# Patient Record
Sex: Female | Born: 1960
Health system: Southern US, Community
[De-identification: ages and names within clinical notes are randomized; demographics above are authoritative.]

## PROBLEM LIST (undated history)

## (undated) DIAGNOSIS — E785 Hyperlipidemia, unspecified: Secondary | ICD-10-CM

## (undated) DIAGNOSIS — E119 Type 2 diabetes mellitus without complications: Secondary | ICD-10-CM

## (undated) DIAGNOSIS — T7840XA Allergy, unspecified, initial encounter: Secondary | ICD-10-CM

## (undated) DIAGNOSIS — F419 Anxiety disorder, unspecified: Secondary | ICD-10-CM

## (undated) DIAGNOSIS — I10 Essential (primary) hypertension: Secondary | ICD-10-CM

## (undated) HISTORY — PX: TEMPOROMANDIBULAR JOINT SURGERY: SHX35

## (undated) HISTORY — DX: Type 2 diabetes mellitus without complications: E11.9

## (undated) HISTORY — PX: BACK SURGERY: SHX140

## (undated) HISTORY — PX: SPINE SURGERY: SHX786

## (undated) HISTORY — PX: APPENDECTOMY: SHX54

## (undated) HISTORY — DX: Anxiety disorder, unspecified: F41.9

## (undated) HISTORY — DX: Essential (primary) hypertension: I10

## (undated) HISTORY — PX: TONSILLECTOMY: SUR1361

## (undated) HISTORY — PX: COLONOSCOPY: SHX174

## (undated) HISTORY — DX: Hyperlipidemia, unspecified: E78.5

## (undated) HISTORY — PX: OTHER SURGICAL HISTORY: SHX169

## (undated) HISTORY — DX: Allergy, unspecified, initial encounter: T78.40XA

---

## 2000-07-12 ENCOUNTER — Other Ambulatory Visit: Admission: RE | Admit: 2000-07-12 | Discharge: 2000-07-12 | Payer: Self-pay | Admitting: Internal Medicine

## 2000-08-26 ENCOUNTER — Encounter: Admission: RE | Admit: 2000-08-26 | Discharge: 2000-08-26 | Payer: Self-pay | Admitting: Internal Medicine

## 2000-08-26 ENCOUNTER — Encounter: Payer: Self-pay | Admitting: Internal Medicine

## 2001-10-30 ENCOUNTER — Ambulatory Visit (HOSPITAL_COMMUNITY): Admission: RE | Admit: 2001-10-30 | Discharge: 2001-10-30 | Payer: Self-pay | Admitting: Gastroenterology

## 2003-03-26 ENCOUNTER — Other Ambulatory Visit: Admission: RE | Admit: 2003-03-26 | Discharge: 2003-03-26 | Payer: Self-pay | Admitting: Obstetrics and Gynecology

## 2004-08-18 ENCOUNTER — Other Ambulatory Visit: Admission: RE | Admit: 2004-08-18 | Discharge: 2004-08-18 | Payer: Self-pay | Admitting: Obstetrics and Gynecology

## 2007-04-19 ENCOUNTER — Encounter: Admission: RE | Admit: 2007-04-19 | Discharge: 2007-04-19 | Payer: Self-pay | Admitting: Internal Medicine

## 2007-04-21 ENCOUNTER — Encounter: Admission: RE | Admit: 2007-04-21 | Discharge: 2007-04-21 | Payer: Self-pay | Admitting: Internal Medicine

## 2007-11-07 ENCOUNTER — Ambulatory Visit (HOSPITAL_COMMUNITY): Admission: RE | Admit: 2007-11-07 | Discharge: 2007-11-08 | Payer: Self-pay | Admitting: Neurosurgery

## 2009-01-07 ENCOUNTER — Encounter: Payer: Self-pay | Admitting: Family Medicine

## 2009-10-27 ENCOUNTER — Ambulatory Visit: Payer: Self-pay | Admitting: Family Medicine

## 2009-10-27 DIAGNOSIS — I10 Essential (primary) hypertension: Secondary | ICD-10-CM | POA: Insufficient documentation

## 2009-10-27 DIAGNOSIS — R5383 Other fatigue: Secondary | ICD-10-CM | POA: Insufficient documentation

## 2009-10-27 DIAGNOSIS — R32 Unspecified urinary incontinence: Secondary | ICD-10-CM | POA: Insufficient documentation

## 2009-10-27 DIAGNOSIS — E785 Hyperlipidemia, unspecified: Secondary | ICD-10-CM | POA: Insufficient documentation

## 2009-11-01 LAB — CONVERTED CEMR LAB
ALT: 24 units/L (ref 0–35)
AST: 22 units/L (ref 0–37)
Albumin: 4.2 g/dL (ref 3.5–5.2)
Alkaline Phosphatase: 72 units/L (ref 39–117)
BUN: 22 mg/dL (ref 6–23)
Basophils Absolute: 0 10*3/uL (ref 0.0–0.1)
Basophils Relative: 0.1 % (ref 0.0–3.0)
Bilirubin, Direct: 0.1 mg/dL (ref 0.0–0.3)
CO2: 32 meq/L (ref 19–32)
Calcium: 9.6 mg/dL (ref 8.4–10.5)
Chloride: 97 meq/L (ref 96–112)
Cholesterol: 207 mg/dL — ABNORMAL HIGH (ref 0–200)
Creatinine, Ser: 0.7 mg/dL (ref 0.4–1.2)
Direct LDL: 149 mg/dL
Eosinophils Absolute: 0.1 10*3/uL (ref 0.0–0.7)
Eosinophils Relative: 0.9 % (ref 0.0–5.0)
GFR calc non Af Amer: 92.37 mL/min (ref >60)
Glucose, Bld: 94 mg/dL (ref 70–99)
HCT: 40.8 % (ref 36.0–46.0)
HDL: 46.4 mg/dL (ref >39.00)
Hemoglobin: 14 g/dL (ref 12.0–15.0)
Lymphocytes Relative: 29.9 % (ref 12.0–46.0)
Lymphs Abs: 3.5 10*3/uL (ref 0.7–4.0)
MCHC: 34.3 g/dL (ref 30.0–36.0)
MCV: 86.7 fL (ref 78.0–100.0)
Monocytes Absolute: 0.4 10*3/uL (ref 0.1–1.0)
Monocytes Relative: 3.1 % (ref 3.0–12.0)
Neutro Abs: 7.6 10*3/uL (ref 1.4–7.7)
Neutrophils Relative %: 66 % (ref 43.0–77.0)
Platelets: 259 10*3/uL (ref 150.0–400.0)
Potassium: 3.9 meq/L (ref 3.5–5.1)
RBC: 4.7 M/uL (ref 3.87–5.11)
RDW: 15.1 % — ABNORMAL HIGH (ref 11.5–14.6)
Sodium: 140 meq/L (ref 135–145)
TSH: 1.58 microintl units/mL (ref 0.35–5.50)
Total Bilirubin: 0.5 mg/dL (ref 0.3–1.2)
Total CHOL/HDL Ratio: 4
Total Protein: 7.2 g/dL (ref 6.0–8.3)
Triglycerides: 160 mg/dL — ABNORMAL HIGH (ref 0.0–149.0)
VLDL: 32 mg/dL (ref 0.0–40.0)
WBC: 11.6 10*3/uL — ABNORMAL HIGH (ref 4.5–10.5)

## 2009-11-18 ENCOUNTER — Ambulatory Visit: Payer: Self-pay | Admitting: Family Medicine

## 2009-11-18 ENCOUNTER — Other Ambulatory Visit: Admission: RE | Admit: 2009-11-18 | Discharge: 2009-11-18 | Payer: Self-pay | Admitting: Family Medicine

## 2009-11-18 LAB — HM PAP SMEAR

## 2009-11-28 ENCOUNTER — Encounter (INDEPENDENT_AMBULATORY_CARE_PROVIDER_SITE_OTHER): Payer: Self-pay | Admitting: *Deleted

## 2009-11-28 LAB — CONVERTED CEMR LAB
Pap Smear: NEGATIVE
Pap Smear: NORMAL

## 2010-02-02 ENCOUNTER — Telehealth: Payer: Self-pay | Admitting: Family Medicine

## 2010-02-03 ENCOUNTER — Ambulatory Visit: Payer: Self-pay | Admitting: Family Medicine

## 2010-02-03 LAB — CONVERTED CEMR LAB
BUN: 16 mg/dL (ref 6–23)
CO2: 33 meq/L — ABNORMAL HIGH (ref 19–32)
Calcium: 9.4 mg/dL (ref 8.4–10.5)
Chloride: 100 meq/L (ref 96–112)
Creatinine, Ser: 0.6 mg/dL (ref 0.4–1.2)
GFR calc non Af Amer: 112.05 mL/min (ref >60)
Glucose, Bld: 114 mg/dL — ABNORMAL HIGH (ref 70–99)
Potassium: 3.8 meq/L (ref 3.5–5.1)
Sodium: 140 meq/L (ref 135–145)

## 2010-02-07 ENCOUNTER — Encounter: Payer: Self-pay | Admitting: Family Medicine

## 2010-02-08 ENCOUNTER — Telehealth: Payer: Self-pay | Admitting: Family Medicine

## 2010-03-15 ENCOUNTER — Ambulatory Visit: Payer: Self-pay | Admitting: Family Medicine

## 2010-03-16 LAB — CONVERTED CEMR LAB
ALT: 28 units/L (ref 0–35)
AST: 24 units/L (ref 0–37)
Albumin: 3.9 g/dL (ref 3.5–5.2)
Alkaline Phosphatase: 60 units/L (ref 39–117)
BUN: 21 mg/dL (ref 6–23)
Bilirubin, Direct: 0.1 mg/dL (ref 0.0–0.3)
CO2: 32 meq/L (ref 19–32)
Calcium: 9.4 mg/dL (ref 8.4–10.5)
Chloride: 100 meq/L (ref 96–112)
Cholesterol: 230 mg/dL — ABNORMAL HIGH (ref 0–200)
Creatinine, Ser: 0.7 mg/dL (ref 0.4–1.2)
Direct LDL: 165.8 mg/dL
GFR calc non Af Amer: 87.93 mL/min (ref >60)
Glucose, Bld: 140 mg/dL — ABNORMAL HIGH (ref 70–99)
HDL: 39.3 mg/dL (ref >39.00)
Potassium: 3.8 meq/L (ref 3.5–5.1)
Sodium: 140 meq/L (ref 135–145)
Total Bilirubin: 0.7 mg/dL (ref 0.3–1.2)
Total CHOL/HDL Ratio: 6
Total Protein: 7.1 g/dL (ref 6.0–8.3)
Triglycerides: 176 mg/dL — ABNORMAL HIGH (ref 0.0–149.0)
VLDL: 35.2 mg/dL (ref 0.0–40.0)

## 2010-05-10 ENCOUNTER — Telehealth: Payer: Self-pay | Admitting: Family Medicine

## 2010-06-25 ENCOUNTER — Encounter: Payer: Self-pay | Admitting: Family Medicine

## 2010-06-25 ENCOUNTER — Encounter: Payer: Self-pay | Admitting: Internal Medicine

## 2010-07-04 NOTE — Progress Notes (Signed)
Summary: Walk-in patient request to speak with m.d.  Phone Note Call from Patient   Reason for Call: Talk to Nurse, Talk to Doctor Details for Reason: Walk-In request to speak with doctor Summary of Call: Patient walked in and requested to speak with doctor.  She would only share that she was in to discuss a personal matter that she said you had discussed.  She said that if you have an opportunity to call, she'd appreciate it.  The number she provided was [phone].    There were two attempts to call patient back to obtain more information for you and there was no answer.  Voice mail was left asking patient to call back.  From her walking in the office, appears she is expecting a call from physician also.  Thanks Initial call taken by: Clarisa Schools,  May 10, 2010 3:53 PM  Follow-up for Phone Call        Call pt discussed her issue with her. Ruthe Mannan MD  May 11, 2010 7:34 AM

## 2010-07-04 NOTE — Assessment & Plan Note (Signed)
Summary: NEW PT TO EST/CLE   Vital Signs:  Patient profile:   50 year old female Height:      70 inches Weight:      249.50 pounds BMI:     35.93 Temp:     98.3 degrees F oral Pulse rate:   68 / minute Pulse rhythm:   regular BP sitting:   124 / 86  (left arm) Cuff size:   large  Vitals Entered By: Linde Gillis CMA Duncan Dull) (10/30/09 10:51 AM) CC: new patient   History of Present Illness: 50 yo female here to establish care.  Fatigue - has been more tired over this past year.  Has gained 60 pounds over the past year.  Works from home, does admit to eating more but does not feel she has eaten enough to gain that much weight.  Very sedentary.  Denies any symptoms of anxiety or depression.  No SOB or DOE but admits to feeling deconditioned. Does have worsening constipation, feels hair is dry as well.  Not sleeping well, wakes up multiple times a night to urinate.  Has done that for years.  Urinary incontinence- mixed, stress and urge. Never been worked up for it.  Ongoing for years, getting worse.  Beginning to affect her social life.  HTN- has been on antihypertensive for over 10 years.  Very strong family h/o HTN and CAD.  Currently taking Atenolol-chlorthal 50-25- 1 tab daily.  No CP, blurred vision, or SOB.  HLD- on Trilipix 135 mg daily.  Cholesterol last checked 6 months ago.  Preventive Screening-Counseling & Management  Alcohol-Tobacco     Smoking Status: never  Caffeine-Diet-Exercise     Does Patient Exercise: no  Current Medications (verified): 1)  Atenolol-Chlorthalidone 50-25 Mg Tabs (Atenolol-Chlorthalidone) .... Take One Tablet By Mouth Daily 2)  Trilipix 135 Mg Cpdr (Choline Fenofibrate) .... 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 Eyes:  Denies blurring. ENT:  Denies difficulty swallowing. CV:  Denies chest pain or discomfort. Resp:  Denies shortness of breath. GI:  Complains of constipation; denies abdominal pain and dark tarry stools. GU:  Complains of incontinence, nocturia, and urinary frequency; denies dysuria. MS:  Denies joint pain, joint redness, and joint swelling. Derm:  Denies rash. Neuro:  Denies headaches. Psych:  Denies anxiety, depression, suicidal thoughts/plans, thoughts of violence, unusual visions or sounds, and thoughts /plans of harming others. Endo:  Complains of weight change; denies cold intolerance and heat intolerance. Heme:  Denies abnormal bruising and bleeding.  Physical Exam  General:  alert and overweight-appearing.   Head:  normocephalic and atraumatic.   Eyes:  vision grossly intact, pupils equal, and pupils  round.   Ears:  R ear normal and L ear normal.   Nose:  no external deformity.   Mouth:  good dentition.   Lungs:  Normal respiratory effort, chest expands symmetrically. Lungs are clear to auscultation, no crackles or wheezes. Heart:  Normal rate and regular rhythm. S1 and S2 normal without gallop, murmur, click, rub or other extra sounds. Abdomen:  Bowel sounds positive,abdomen  soft and non-tender without masses, organomegaly or hernias noted. Extremities:  no edema Neurologic:  alert & oriented X3.   Skin:  Intact without suspicious lesions or rashes Psych:  Oriented X3, memory intact for recent and remote, normally interactive, good eye contact, not anxious appearing, and not depressed appearing.     Impression & Recommendations:  Problem # 1:  URINARY INCONTINENCE (ICD-788.30) Assessment Deteriorated Discussed getting urodynamic work up.  She does not want to start medications. There are other options, which we discussed depending on source of her incontinence, such as pessaries but I would suggestion urodynmaic studies.  She will think about it and call me back  Problem # 2:  FATIGUE (ICD-780.79) Assessment: New Likely related to deconditioning from her weight gain but will check BMET, CBC, TSH to rule out other possible causes. Orders: TLB-CBC Platelet - w/Differential (85025-CBCD) TLB-TSH (Thyroid Stimulating Hormone) (84443-TSH)  Problem # 3:  HYPERTENSION (ICD-401.9) Assessment: Unchanged Stable on current meds. Her updated medication list for this problem includes:    Atenolol-chlorthalidone 50-25 Mg Tabs (Atenolol-chlorthalidone) .Marland Kitchen... Take one tablet by mouth daily  Problem # 4:  HYPERLIPIDEMIA (ICD-272.4) Assessment: Unchanged Recheck lipids today. Her updated medication list for this problem includes:    Trilipix 135 Mg Cpdr (Choline fenofibrate) .Marland Kitchen... Take one tablet by mouth daily  Complete Medication List: 1)  Atenolol-chlorthalidone 50-25 Mg Tabs (Atenolol-chlorthalidone) .... Take one tablet by mouth daily 2)  Trilipix 135 Mg Cpdr (Choline fenofibrate) .... Take one tablet by mouth daily  Other Orders: Venipuncture (09323) TLB-Hepatic/Liver Function Pnl (80076-HEPATIC) TLB-BMP (Basic Metabolic Panel-BMET) (80048-METABOL) TLB-Lipid Panel (80061-LIPID)  Patient Instructions: 1)  It was nice to meet you, Hannah Giles. 2)  Please make  an appointment to come see me for a complete physical/pap smear at your convenience.  Current Allergies (reviewed today): No known allergies    Past Medical History:    Hyperlipidemia    Hypertension    Urinary incontinence  Past Surgical History:    Appendectomy    Tonsillectomy    GYN surgery   Appended Document: NEW PT TO EST/CLE     Allergies: No Known Drug Allergies   Complete Medication List: 1)  Atenolol-chlorthalidone 50-25 Mg Tabs (Atenolol-chlorthalidone) .... Take one tablet by mouth daily 2)  Trilipix 135 Mg Cpdr (Choline fenofibrate) .... Take one tablet by mouth daily  Other Orders: Tdap => 54yrs IM (55732) Admin 1st Vaccine (20254)    Immunizations Administered:  Tetanus Vaccine:    Vaccine Type: Tdap    Site: left deltoid    Mfr: GlaxoSmithKline    Dose: 0.5 ml    Route: IM    Given by: Linde Gillis CMA (AAMA)    Exp. Date: 08/27/2011    Lot #: [id number]    VIS given: 04/22/07 version given Oct 27, 2009.

## 2010-07-04 NOTE — Assessment & Plan Note (Signed)
Summary: FOLLOW-UP/JRR   Vital Signs:  Patient profile:   50 year old female Height:      70 inches Weight:      250.0 pounds BMI:     36.00 Temp:     98.8 degrees F oral Pulse rate:   80 / minute Pulse rhythm:   regular BP sitting:   120 / 78  (left arm) Cuff size:   large  Vitals Entered By: Benny Lennert CMA Duncan Dull) (March 15, 2010 7:58 AM)  History of Present Illness: 50 yo very pleasant female here to follow up HTN.   Saw Dr. Para March last month, BP at home was elevated to 150/80- 190/100.  Was having HA.  No blurred vision, CP or SOB. In office BP was elevated but not as high at 132/88. Has been on same BP meds for a long time. Does now admit to being under more stress at work.  Recently returned from a vacaition in [place].  Feels wonderfu, had a great time.   Got a new home BP cuff, [facility].    HLD- could not tolerate Niacian.  On Trilipix 135 mg daily.  See [facility] form.  Allergies: 1)  ! Niacin Cr (Niacin)  Review of Systems      See HPI General:  Denies malaise. Eyes:  Denies blurring. ENT:  Denies difficulty swallowing. CV:  Denies chest pain or discomfort. Resp:  Denies shortness of breath.  Physical Exam  General:  GEN: nad, alert and oriented HEENT: mucous membranes moist NECK: supple w/o LA CV: rrr.  no murmur PULM: ctab, no inc wob ABD: soft, +bs EXT: no edema SKIN: no acute rash    Impression & Recommendations:  Problem # 1:  HYPERTENSION (ICD-401.9) Assessment Improved Stable.  ?due to increased work volume during those couple of weeks and possibly a machine that needed to be recalibrated.  Advised to let me know how her BP is reading at home (she is an Charity fundraiser).  If this machine starts to act up, suggested [facility] brand. Her updated medication list for this problem includes:    Atenolol-chlorthalidone 50-25 Mg Tabs (Atenolol-chlorthalidone) .Marland Kitchen... Take one tablet by mouth daily  Problem # 2:  HYPERLIPIDEMIA (ICD-272.4) Assessment:  Unchanged recheck FLP today. Her updated medication list for this problem includes:    Trilipix 135 Mg Cpdr (Choline fenofibrate) .Marland Kitchen... Take one tablet by mouth daily  Orders: Venipuncture (16073) TLB-Lipid Panel (80061-LIPID) TLB-BMP (Basic Metabolic Panel-BMET) (80048-METABOL) TLB-Hepatic/Liver Function Pnl (80076-HEPATIC)  Complete Medication List: 1)  Atenolol-chlorthalidone 50-25 Mg Tabs (Atenolol-chlorthalidone) .... Take one tablet by mouth daily 2)  Trilipix 135 Mg Cpdr (Choline fenofibrate) .... Take one tablet by mouth daily 3)  Tylenol Extra Strength 500 Mg Tabs (Acetaminophen) .... Otc as directed.   Immunization History:  Influenza Immunization History:    Influenza:  fluvax 3+ (02/10/2010)   CC: follow up blood pressure   Prevention & Chronic Care Immunizations   Influenza vaccine: Fluvax 3+  (02/10/2010)    Tetanus booster: 10/27/2009: Tdap    Pneumococcal vaccine: Not documented  Colorectal Screening   Hemoccult: Not documented   Hemoccult action/deferral: Not indicated  (11/18/2009)    Colonoscopy: Not documented   Colonoscopy action/deferral: Not indicated  (11/18/2009)  Other Screening   Pap smear: normal  (11/28/2009)   Pap smear action/deferral: Ordered  (11/18/2009)    Mammogram: Not documented   Mammogram action/deferral: Ordered  (11/18/2009)   Smoking status: never  (10/27/2009)  Lipids   Total Cholesterol: 207  (10/27/2009)  LDL: Not documented   LDL Direct: 149.0  (10/27/2009)   HDL: 46.40  (10/27/2009)   Triglycerides: 160.0  (10/27/2009)   Lipid panel due: 02/18/2010    SGOT (AST): 22  (10/27/2009)   SGPT (ALT): 24  (10/27/2009)   Alkaline phosphatase: 72  (10/27/2009)   Total bilirubin: 0.5  (10/27/2009)   Liver panel due: 02/18/2010    Lipid flowsheet reviewed?: Yes   Progress toward LDL goal: Unchanged  Hypertension   Last Blood Pressure: 120 / 78  (03/15/2010)   Serum creatinine: 0.6  (02/03/2010)   Serum  potassium 3.8  (02/03/2010)  Self-Management Support :    Hypertension self-management support: Not documented    Lipid self-management support: Written self-care plan  (11/18/2009)

## 2010-07-04 NOTE — Assessment & Plan Note (Signed)
Summary: BP IS ELEVATED   Vital Signs:  Patient profile:   50 year old female Height:      70 inches Weight:      250 pounds Temp:     98.2 degrees F oral Pulse rate:   80 / minute Pulse rhythm:   regular BP sitting:   132 / 88  (left arm) Cuff size:   large  Vitals Entered By: Delilah Shan CMA Verlinda Slotnick Dull) (February 03, 2010 8:26 AM)  Serial Vital Signs/Assessments:  Time      Position  BP       Pulse  Resp  Temp     By 8:33 AM   Lying RA  142/80                         Lugene Fuquay CMA (AAMA) CORRECTION:  The above reading is right arm, sitting. Lugene Fuquay CMA Shena Vinluan Dull)  February 03, 2010 8:34 AM   History of Present Illness: Pt checked BP last month.  Usually ranged from  ~150/80 to 190/100.  Occ HA and increase hot flashes.  No CP/SOB/BLE edema.  Compliant with meds.  BP had prev been normal.  No change in work recently.  Automated cuff.  Pt's husband checked his pressure on the cuff at it was normal.  Didn't bring in the cuff today.  Was checking pressure at different times of day, sometimes during work.  Pt is a Engineer, civil (consulting).    Allergies: 1)  ! Niacin Cr (Niacin)  Past History:  Past Medical History: Last updated: 10/27/2009 Hyperlipidemia Hypertension Urinary incontinence  Review of Systems       See HPI.  Otherwise negative.    Physical Exam  General:  GEN: nad, alert and oriented HEENT: mucous membranes moist NECK: supple w/o LA CV: rrr.  no murmur PULM: ctab, no inc wob ABD: soft, +bs EXT: no edema SKIN: no acute rash    Impression & Recommendations:  Problem # 1:  HYPERTENSION (ICD-401.9) No change in meds.  report back with BP reading and calibrate cuff.  I wouldn't want her BP to drop with overcorrection since it has been normal her in the office on prev checks.  She agrees.  If CP/SOB, to ER.  She understands.  Her updated medication list for this problem includes:    Atenolol-chlorthalidone 50-25 Mg Tabs (Atenolol-chlorthalidone) .Marland Kitchen... Take one tablet  by mouth daily  Orders: TLB-BMP (Basic Metabolic Panel-BMET) (80048-METABOL)  Complete Medication List: 1)  Atenolol-chlorthalidone 50-25 Mg Tabs (Atenolol-chlorthalidone) .... Take one tablet by mouth daily 2)  Trilipix 135 Mg Cpdr (Choline fenofibrate) .... Take one tablet by mouth daily 3)  Tylenol Extra Strength 500 Mg Tabs (Acetaminophen) .... Otc as directed.  Patient Instructions: 1)  Bring your cuff in next week so we can calibrate it- get a nurse visit early one morning.  Check your pressure each morning after you have urinated and have sat down for a few minutes.  Let me know about the readings.  We'll contact you with your lab report.  Don"t change your meds.   Current Allergies (reviewed today): ! NIACIN CR (NIACIN)

## 2010-07-04 NOTE — Progress Notes (Signed)
Summary: rx refill   Phone Note Refill Request Message from:  Patient on February 08, 2010 1:16 PM  Refills Requested: Medication #1:  ATENOLOL-CHLORTHALIDONE 50-25 MG TABS take one tablet by mouth daily  Medication #2:  TRILIPIX 135 MG CPDR take one tablet by mouth daily [facility] [address]   Initial call taken by: Melody Comas,  February 08, 2010 1:16 PM    Prescriptions: TRILIPIX 135 MG CPDR (CHOLINE FENOFIBRATE) take one tablet by mouth daily  #60 x 0   Entered and Authorized by:   Ruthe Mannan MD   Signed by:   Ruthe Mannan MD on 02/08/2010   Method used:   [facility] to        [facility] [phone]* (retail)       [address] [place], [place]  [address]       Ph: [phone] or [phone]       Fax: [phone]   RxID:   [id number] ATENOLOL-CHLORTHALIDONE 50-25 MG TABS (ATENOLOL-CHLORTHALIDONE) take one tablet by mouth daily  #60 x 0   Entered and Authorized by:   Ruthe Mannan MD   Signed by:   Ruthe Mannan MD on 02/08/2010   Method used:   [facility]ically to        [facility] [phone]* (retail)       [address] [place], [place]  [address]       Ph: [phone] or [phone]       Fax: [phone]   RxID:   [id number]

## 2010-07-04 NOTE — Miscellaneous (Signed)
Summary: pap results  Clinical Lists Changes  Observations: Added new observation of PAP SMEAR: normal (11/28/2009 8:58)      Preventive Care Screening  Pap Smear:    Date:  11/28/2009    Results:  normal

## 2010-07-04 NOTE — Assessment & Plan Note (Signed)
Summary: B/P CHECK/ARON/JRR  Nurse Visit   Patient was came in for a BP check today but before I could get to her to check her blood pressure she had left, she told the front desk she had to get back to work.  She left her blood pressure readings here with [place]. 02/04/2010 132/84, 02/05/2010 115/77, 02/06/2010 120/79.  Patient is on Atenolol-Chlorthalidone 50-25mg  daily.  Please advise.  Linde Gillis CMA Duncan Dull)  February 07, 2010 9:02 AM   Thank you.  I would not increase dose. Ruthe Mannan MD  February 07, 2010 3:05 PM  Left message on cell phone voicemail advising patient as instructed.  Linde Gillis CMA Duncan Dull)  February 07, 2010 4:12 PM     Allergies: 1)  ! Niacin Cr (Niacin)

## 2010-07-04 NOTE — Assessment & Plan Note (Signed)
Summary: CPX WITH PAP/RBH   Vital Signs:  Patient profile:   49 year old female Height:      70 inches Weight:      249.13 pounds BMI:     35.88 Temp:     97.8 degrees F oral Pulse rate:   72 / minute Pulse rhythm:   regular BP sitting:   120 / 78  (left arm) Cuff size:   large  Vitals Entered By: Lewanda Rife LPN (November 18, 2009 8:33 AM) CC: CPX [id number]   History of Present Illness: 50 yo female here for CPX/pap.   HTN- has been on antihypertensive for over 10 years.  Very strong family h/o HTN and CAD.  Currently taking Atenolol-chlorthal 50-25- 1 tab daily.  No CP, blurred vision, or SOB.  HLD- still poorly controlled.  See Flow sheet.  Added Niaspan, could not tolerate.  Well woman- due for mammogram. Family h/o colono CA, followed by Dr. Loreta Ave.  Colonoscopy every 5 years.     Current Medications (verified): 1)  Atenolol-Chlorthalidone 50-25 Mg Tabs (Atenolol-Chlorthalidone) .... Take One Tablet By Mouth Daily 2)  Trilipix 135 Mg Cpdr (Choline Fenofibrate) .... Take One Tablet By Mouth Daily 3)  Tylenol Extra Strength 500 Mg Tabs (Acetaminophen) .... Otc As Directed.  Allergies (verified): 1)  ! 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 Eyes:  Denies blurring. ENT:  Denies difficulty swallowing. CV:  Denies chest pain or discomfort and shortness of breath with exertion. Resp:   Denies shortness of breath. GI:  Denies abdominal pain, bloody stools, and change in bowel habits. GU:  Denies abnormal vaginal bleeding, discharge, and dysuria. MS:  Denies joint pain, joint redness, and joint swelling. Derm:  Denies rash. Neuro:  Denies visual disturbances and weakness. Psych:  Denies anxiety and depression. Endo:  Denies cold intolerance and polyuria.  Physical Exam  General:  alert and overweight-appearing.   Head:  normocephalic and atraumatic.   Eyes:  vision grossly intact, pupils equal, and pupils round.   Ears:  R ear normal and L ear normal.   Nose:  no external deformity.   Mouth:  good dentition.   Breasts:  No mass, nodules, thickening, tenderness, bulging, retraction, inflamation, nipple discharge or skin changes noted.   Lungs:  Normal respiratory effort, chest expands symmetrically. Lungs are clear to auscultation, no crackles or wheezes. Heart:  Normal rate and regular rhythm. S1 and S2 normal without gallop, murmur, click, rub or other extra sounds. Abdomen:  Bowel sounds positive,abdomen soft and non-tender without masses, organomegaly or hernias noted. Rectal:  no external abnormalities and no hemorrhoids.   Genitalia:  Pelvic Exam:        External: normal female genitalia without lesions or masses        Vagina: normal without lesions or masses        Cervix: normal without lesions or masses        Adnexa: normal bimanual exam without masses or fullness  Uterus: normal by palpation        Pap smear: performed Msk:  No deformity or scoliosis noted of thoracic or lumbar spine.   Extremities:  no edema Neurologic:  alert & oriented X3.   Skin:  Intact without suspicious lesions or rashes Psych:  Oriented X3, memory intact for recent and remote, normally interactive, good eye contact, not anxious appearing, and not depressed appearing.     Impression & Recommendations:  Problem # 1:  Preventive Health Care (ICD-V70.0) Reviewed preventive  care protocols, scheduled due services, and updated immunizations Discussed nutrition, exercise, diet, and healthy lifestyle.  set up mammogram today.  Problem # 2:  HYPERLIPIDEMIA (ICD-272.4) Assessment: Deteriorated  Stopped the Niaspan and added to allergy list. Discussed new diet strategies (see pt instructions for details).  Follow up in 3 months. The following medications were removed from the medication list:    Niaspan 500 Mg Cr-tabs (Niacin (antihyperlipidemic)) .Marland Kitchen... Take one tablet by mouth at bedtime Her updated medication list for this problem includes:    Trilipix 135 Mg Cpdr (Choline fenofibrate) .Marland Kitchen... Take one tablet by mouth daily  Labs Reviewed: SGOT: 22 (10/27/2009)   SGPT: 24 (10/27/2009)   HDL:46.40 (10/27/2009)  Chol:207 (10/27/2009)  Trig:160.0 (10/27/2009)  Complete Medication List: 1)  Atenolol-chlorthalidone 50-25 Mg Tabs (Atenolol-chlorthalidone) .... Take one tablet by mouth daily 2)  Trilipix 135 Mg Cpdr (Choline fenofibrate) .... Take one tablet by mouth daily 3)  Tylenol Extra Strength 500 Mg Tabs (Acetaminophen) .... Otc as directed.  Other Orders: Radiology Referral (Radiology)  Patient Instructions: 1)  Great to see you, Hannah Giles. 2)    Weight loss (even small amount) can decrease triglycerides and LDL cholesterol.  Decrease added sugars, eliminate trans fats, increase fiber and limit alcohol.  3)  Try to cut back portions of starchy foods, like potatoes. 4)  Please make an appointment to see me in 3 months. 5)  Stop by to see Shirlee Limerick on your way out to help set up your mammogmram.  Current Allergies (reviewed today): ! NIACIN CR (NIACIN)  Prevention & Chronic Care Immunizations   Influenza vaccine: Not documented    Tetanus booster: 10/27/2009: Tdap    Pneumococcal vaccine: Not documented  Colorectal Screening   Hemoccult: Not documented   Hemoccult action/deferral: Not indicated  (11/18/2009)    Colonoscopy: Not documented    Colonoscopy action/deferral: Not indicated  (11/18/2009)  Other Screening   Pap smear: Not documented   Pap smear action/deferral: Ordered  (11/18/2009)    Mammogram: Not documented   Mammogram action/deferral: Ordered  (11/18/2009)   Smoking status: never  (10/27/2009)    Screening comments: Gets colonscopy every 5 years due to her mom's h/o colon CA, sees Dr. Loreta Ave.  Lipids   Total Cholesterol: 207  (10/27/2009)   LDL: Not documented   LDL Direct: 149.0  (10/27/2009)   HDL: 46.40  (10/27/2009)   Triglycerides: 160.0  (10/27/2009)   Lipid panel due: 02/18/2010    SGOT (AST): 22  (10/27/2009)   SGPT (ALT): 24  (10/27/2009)   Alkaline phosphatase: 72  (10/27/2009)   Total bilirubin: 0.5  (10/27/2009)   Liver panel due: 02/18/2010    Lipid flowsheet reviewed?: Yes   Progress toward LDL goal: Deteriorated   Lipid comments: We started her on Niaspan last week, after a few doses, face swelled up and had severe itching, added to her allergy list.  Hypertension   Last Blood Pressure: 120 / 78  (11/18/2009)   Serum creatinine: 0.7  (  10/27/2009)   Serum potassium 3.9  (10/27/2009)    Hypertension flowsheet reviewed?: Yes   Progress toward BP goal: At goal  Self-Management Support :    Patient will work on the following items until the next clinic visit to reach self-care goals:     Medications and monitoring: take my medicines every day  (11/18/2009)     Eating: eat more vegetables, use fresh or frozen vegetables, eat baked foods instead of fried foods, limit or avoid alcohol  (11/18/2009)     Activity: take a 30 minute walk every day  (11/18/2009)    Hypertension self-management support: Not documented    Lipid self-management support: Written self-care plan  (11/18/2009)   Lipid self-care plan printed.   Nursing Instructions: Schedule screening mammogram (see order) Pap smear today

## 2010-07-04 NOTE — Progress Notes (Signed)
Summary: BP is elevated  Phone Note Call from Patient Call back at [facility] Phone [phone]   Caller: Patient Summary of Call: Pt states her BP has been running around 190/97-104 for the last 3-4 weeks.  She has appt to see Dr. Dayton Martes on 9/12 but asks if she needs to do something before then.  She says she has a headache every day, feels tired  Should she increase her medicine?  [facility]  Initial call taken by: Lowella Petties CMA,  February 02, 2010 9:38 AM  Follow-up for Phone Call        she needs f/u with first avail physician please Follow-up by: Judith Part MD,  February 02, 2010 10:31 AM  Additional Follow-up for Phone Call Additional follow up Details #1::        [facility] for pt to call back.               Jerzey Komperda CMA  February 02, 2010 10:47 AM  Appt made for tomorrow with Dr. Para March.  Additional Follow-up by: Lowella Petties CMA,  February 02, 2010 11:37 AM

## 2010-08-07 ENCOUNTER — Ambulatory Visit (INDEPENDENT_AMBULATORY_CARE_PROVIDER_SITE_OTHER): Payer: 59 | Admitting: Family Medicine

## 2010-08-07 ENCOUNTER — Encounter: Payer: Self-pay | Admitting: Family Medicine

## 2010-08-07 DIAGNOSIS — J019 Acute sinusitis, unspecified: Secondary | ICD-10-CM

## 2010-08-10 ENCOUNTER — Telehealth: Payer: Self-pay | Admitting: Family Medicine

## 2010-08-15 NOTE — Assessment & Plan Note (Signed)
Summary: ? SINUS INFECTION   Vital Signs:  Patient profile:   50 year old female Weight:      248.25 pounds Temp:     98.6 degrees F oral Pulse rate:   84 / minute Pulse rhythm:   regular BP sitting:   136 / 80  (left arm) Cuff size:   large  Vitals Entered By: Selena Batten Dance CMA Duncan Dull) (August 07, 2010 2:21 PM) CC: ? Sinus Infection   History of Present Illness: CC: ? sinus infection  5 d h/o feeling ill.  Started with laryngitis.  Then 3d h/o green nasal discharge, dry hacking cough.  + sinus pressure HA, worse with bending head forward.  mild chill over weekend.  Has tried mucinex and cough drops. and delsym.    No ST.  no ear pain, tooth pain, abd pain, fevers, n/v/d, rashes, myalgias, arthralgias.  no sick contacts, no smokers at home.  No h/o asthma, allergies.  doesn't tend to get sinus infection.  Pt is RN, on the phone all day.  was out of work thurs/fri, but went back today.  Current Medications (verified): 1)  Atenolol-Chlorthalidone 50-25 Mg Tabs (Atenolol-Chlorthalidone) .... Take One Tablet By Mouth Daily 2)  Trilipix 135 Mg Cpdr (Choline Fenofibrate) .... Take One Tablet By Mouth Daily 3)  Tylenol Extra Strength 500 Mg Tabs (Acetaminophen) .... Otc As Directed.  Allergies: 1)  ! 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  mild maxillary sinus tenderness Eyes:  PERRLA, EOMI, no injection Ears:  Tms clear bilaterally Nose:  nares somewhat congested Mouth:  MMM, mild pharyngeal eryhtmea.  no exudates Neck:  No deformities, masses,  or tenderness noted. Lungs:  Normal respiratory effort, chest expands symmetrically. Lungs are clear to auscultation, no crackles or wheezes. Heart:  Normal rate and regular rhythm. S1 and S2 normal without gallop, murmur, click, rub or other extra sounds. Pulses:  2+ rad pulses, brisk cap refill   Impression & Recommendations:  Problem # 1:  SINUSITIS, ACUTE (ICD-461.9) Assessment New but only 5 days duration.  likely viral.  supportive care as per instructions.  tussionex and flonase and saline irrigation and mucinex.  if not better by end of week , call in abx.  advised to let us know if red flags or not better as expected.  pt is [employer] fundraiser.  Her updated medication list for this problem includes:    Tussionex Pennkinetic Er 10-8 Mg/95ml Lqcr (Hydrocod polst-chlorphen polst) ..... One teaspoon two times a day prn cough (sedation precautions)    Flonase 50 Mcg/act Susp (Fluticasone propionate) .Marland Kitchen... 2 sprays in each nostril daily  Complete Medication List: 1)  Atenolol-chlorthalidone 50-25 Mg Tabs (Atenolol-chlorthalidone) .... Take one tablet by mouth daily 2)  Trilipix 135 Mg Cpdr (Choline fenofibrate) .... Take one tablet by mouth daily 3)  Tylenol Extra Strength 500 Mg Tabs (Acetaminophen) .... Otc as directed. 4)  Tussionex Pennkinetic Er 10-8 Mg/55ml Lqcr (Hydrocod polst-chlorphen polst) .... One teaspoon two times a day prn cough (sedation precautions) 5)  Flonase 50 Mcg/act Susp (Fluticasone propionate) .... 2 sprays in each nostril daily  Patient Instructions: 1)  You have an early  sinus infection, likely viral. 2)  Take medicines as prescribed: tussionex at night, flonase for nasal inflammation 3)  Take guaifenesin 400mg  IR 1 1/2 pills in am and at noon (or simple mucinex) with plenty of fluid to help mobilize mucous. 4)  Use nasal saline spray or neti pot to help drainage of sinuses. 5)  If you start having fevers >101.5, trouble swallowing or breathing, or are worsening instead of  improving as expected, you may need to be seen again. 6)  Call us with update at end of week if no better. 7)  Good to see you today, call clinic with questions.  Prescriptions: FLONASE 50 MCG/ACT SUSP (FLUTICASONE PROPIONATE) 2 sprays in each nostril daily  #1 x 1   Entered and Authorized by:   Eustaquio Boyden  MD   Signed by:   Eustaquio Boyden  MD on 08/07/2010   Method used:   [facility]        [facility] [phone][facility] (retail)       [address] [place], [place]  [address]       Ph: [phone] or [phone]       Fax: [phone]   RxID:   [id number] Sandria Senter ER 10-8 MG/5ML LQCR (HYDROCOD POLST-CHLORPHEN POLST) one teaspoon two times a day prn cough (sedation precautions)  #100cc x 0   Entered and Authorized by:   Eustaquio Boyden  MD   Signed by:   Eustaquio Boyden  MD on 08/07/2010   Method used:   Print then Give to Patient   RxID:   [id number]    Orders Added: 1)  Est. Patient Level III [10932]    Current Allergies (reviewed today): ! NIACIN CR (NIACIN)

## 2010-08-15 NOTE — Progress Notes (Signed)
Summary: sinus infection not any better   Phone Note Call from Patient Call back at Home Phone [phone]   Caller: Patient Call For: Hannah Mannan MD Summary of Call: Patient was seen for sinus infection a few days ago and she says that she is not feeling any better. She is asking that an [facility] [address]  Initial call taken by: Melody Comas,  August 10, 2010 11:27 AM  Follow-up for Phone Call        ok to send.  discussed at OV. Follow-up by: Eustaquio Boyden  MD,  August 10, 2010 12:22 PM  Additional Follow-up for Phone Call Additional follow up Details #1::        Rx sent in and patient notified.  Additional Follow-up by: Janee Morn CMA Duncan Dull),  August 10, 2010 12:28 PM    New/Updated Medications: AMOXICILLIN 875 MG TABS (AMOXICILLIN) take one twice daily for 10 days Prescriptions: AMOXICILLIN 875 MG TABS (AMOXICILLIN) take one twice daily for 10 days  #20 x 0   Entered and Authorized by:   Eustaquio Boyden  MD   Signed by:   Janee Morn CMA (AAMA) on 08/10/2010   Method used:   [facility] to        [facility] [phone]* (retail)       [address] [place], [place]  [address]       Ph: [phone] or [phone]       Fax: [phone]   RxID:   [id number]

## 2010-10-17 NOTE — Op Note (Signed)
[name], [name] NO.:  [id number]   MEDICAL RECORD NO.:  [id number]          PATIENT TYPE:  OIB   LOCATION:  3534                         FACILITY:  [facility]   PHYSICIAN:  Coletta Memos, M.D.     DATE OF BIRTH:  [birth year]   DATE OF PROCEDURE:  11/07/2007  DATE OF DISCHARGE:                               OPERATIVE REPORT   PREOPERATIVE DIAGNOSIS:  1. Displaced disk right L5-S1.  2. Right S1 radiculopathy.  3. Degenerative disk disease L5-S1.   POSTOPERATIVE DIAGNOSES:  1. Displaced disk right L5-S1.  2. Right S1 radiculopathy.  3. Degenerative disk disease L5-S1.   PROCEDURE:  Right L5-S1 semihemilaminectomy and diskectomy with  microdissection.   COMPLICATIONS:  None.   SURGEON:  Coletta Memos, MD   ASSISTANT:  Lovell Sheehan.   INDICATIONS:  Hannah Giles is a 50 year old with severe pain in the back  and right lower extremity.  MRI showed a large herniated disk on the  right side.  She has dealt with this from late winter until now.  Her  pain felt a much sharp return for the worse, this is past 4 days.  She  has opted now to just have her surgery done as soon as possible.   OPERATIVE NOTE:  Ms. Cottrell was taken to the operating room, intubated,  and placed under general anesthetic without difficulty.  She was rolled  prone onto a Wilson frame and all pressure points were properly padded.  Back was prepped and draped in sterile fashion.  I injected local in the  lumbar region.  I opened the skin with a #10 blade and I took this down  to the thoracolumbar fascia.  I then exposed the lamina what I believe  to be L5 and S1 on the right side.  Intraoperative x-ray showed I was in  the correct interlaminar space.  I then actually opened the ligamentum  flavum using a 15-blade removing that and exposing the thecal sac.  The  disk space, however, was still slightly  rostrally where that was, I had  to remove a small portion of the right L5 lamina, therefore,  performing  a limited semihemilaminectomy.  The microscope was brought into the  operative field to aid in microdissection.  With microdissection and Dr.  Lovell Sheehan' assistance, I was able to remove the disk material and  decompress the S1 nerve root and thecal sac at that level.  I then  irrigated the wound.  I then closed the wound in layered fashion using  Vicryl  sutures.  I injected another 40 mL of 0.5% lidocaine 1:200,000  epinephrine into the subcutaneous fat and paraspinous musculature.  I  reapproximated the thoracolumbar fascia with Vicryl, subcutaneous  subcuticular layers.  Dermabond used for sterile dressing.           ______________________________  Coletta Memos, M.D.     KC/MEDQ  D:  11/07/2007  T:  11/08/2007  Job:  [id number]

## 2010-10-20 NOTE — Procedures (Signed)
[facility]  Patient:    Hannah Giles, Hannah Giles Visit Number: [id number] MRN: [id number]          Service Type: END Location: ENDO Attending Physician:  Charna Elizabeth Dictated by:   Anselmo Rod, M.D. Proc. Date: 10/30/01 Admit Date:  10/30/2001   CC:         Kern Reap, M.D.   Procedure Report  DATE OF BIRTH:  March 03, 1951  REFERRING PHYSICIAN:  Kern Reap, M.D.  PROCEDURE PERFORMED:  Screening colonoscopy.  ENDOSCOPIST:  Anselmo Rod, M.D.  INSTRUMENT USED:  Olympus video colonoscope.  INDICATIONS FOR PROCEDURE:  The patient is a 50 year old white female with a family history of colon cancer in her mother.  Rule out colonic polyps, masses, hemorrhoids, etc.  PREPROCEDURE PREPARATION:  Informed consent was procured from the patient. The patient was fasted for eight hours prior to the procedure and prepped with a bottle of magnesium citrate and a gallon of NuLytely the night prior to the procedure.  PREPROCEDURE PHYSICAL:  The patient had stable vital signs.  Neck supple. Chest clear to auscultation.  S1, S2 regular.  Abdomen soft with normal bowel sounds.  No masses palpable.  DESCRIPTION OF PROCEDURE:  The patient was placed in the left lateral decubitus position and sedated with 60 mg of Demerol and 6 mg of Versed intravenously.  Once the patient was adequately sedated and maintained on low-flow oxygen and continuous cardiac monitoring, the Olympus video colonoscope was advanced from the rectum to the cecum with difficulty.  There was a large amount of  residual stool in the colon.  Multiple washes were done.  The procedure was complete up to the terminal ileum.  The appendicular orifice and the ileocecal valve were clearly visualized.  Due to a large amount of residual stool in the colon, small lesions could have been missed. No masses polyps, erosions, ulcerations or diverticula were seen.  The patient had a small  external hemorrhoid seen on withdrawal of the scope.  Retroflexion in the rectum did not reveal any abnormalities.  The patient tolerated the procedure well without complications.  IMPRESSION: 1. Poor prep, large amount of residual stool in the colon. 2. No masses, polyps or diverticula seen. 3. Small external hemorrhoid. 4. Normal-appearing terminal ileum. 5. Small lesions could be missed secondary to his poor prep.  RECOMMENDATIONS: 1. A high fiber diet has been discussed with the patient in view of her    ongoing constipation. 2. Outpatient follow-up is advised in the next two weeks. 3. Repeat colorectal cancer screening will be done in the next three years or    earlier if need be.Dictated by:   Anselmo Rod, M.D. Attending Physician:  Charna Elizabeth DD:  10/30/01 TD:  10/30/01 Job: [id number]

## 2010-11-07 ENCOUNTER — Other Ambulatory Visit: Payer: Self-pay | Admitting: *Deleted

## 2010-11-07 NOTE — Telephone Encounter (Signed)
Ok to refill 

## 2010-11-08 MED ORDER — ATENOLOL-CHLORTHALIDONE 50-25 MG PO TABS: 1.0000 | ORAL_TABLET | Freq: Every day | ORAL | Status: DC

## 2010-11-08 MED ORDER — CHOLINE FENOFIBRATE 135 MG PO CPDR: 135.0000 mg | DELAYED_RELEASE_CAPSULE | Freq: Every day | ORAL | Status: DC

## 2010-11-08 NOTE — Telephone Encounter (Signed)
Rx refilled electronically °

## 2011-02-28 DIAGNOSIS — Z0279 Encounter for issue of other medical certificate: Secondary | ICD-10-CM

## 2011-03-01 LAB — CBC
HCT: 39.1
Hemoglobin: 13.4
MCHC: 34.3
MCV: 86.6
Platelets: 263
RBC: 4.51
RDW: 14.2
WBC: 11.5 — ABNORMAL HIGH

## 2011-03-01 LAB — BASIC METABOLIC PANEL
BUN: 13
CO2: 33 — ABNORMAL HIGH
Calcium: 9.7
Chloride: 99
Creatinine, Ser: 0.7
GFR calc Af Amer: >60
GFR calc non Af Amer: >60
Glucose, Bld: 125 — ABNORMAL HIGH
Potassium: 4.3
Sodium: 141

## 2011-04-09 ENCOUNTER — Other Ambulatory Visit: Payer: Self-pay | Admitting: *Deleted

## 2011-04-09 MED ORDER — CHOLINE FENOFIBRATE 135 MG PO CPDR: 135.0000 mg | DELAYED_RELEASE_CAPSULE | Freq: Every day | ORAL | Status: DC

## 2011-04-09 MED ORDER — ATENOLOL-CHLORTHALIDONE 50-25 MG PO TABS: 1.0000 | ORAL_TABLET | Freq: Every day | ORAL | Status: DC

## 2011-04-18 ENCOUNTER — Ambulatory Visit (INDEPENDENT_AMBULATORY_CARE_PROVIDER_SITE_OTHER): Payer: 59 | Admitting: Family Medicine

## 2011-04-18 ENCOUNTER — Encounter: Payer: Self-pay | Admitting: Family Medicine

## 2011-04-18 VITALS — BP 130/80 | HR 72 | Temp 97.8°F | Ht 70.0 in | Wt 249.0 lb

## 2011-04-18 DIAGNOSIS — E785 Hyperlipidemia, unspecified: Secondary | ICD-10-CM

## 2011-04-18 DIAGNOSIS — Z0000 Encounter for general adult medical examination without abnormal findings: Secondary | ICD-10-CM | POA: Insufficient documentation

## 2011-04-18 DIAGNOSIS — Z1211 Encounter for screening for malignant neoplasm of colon: Secondary | ICD-10-CM

## 2011-04-18 DIAGNOSIS — I10 Essential (primary) hypertension: Secondary | ICD-10-CM

## 2011-04-18 LAB — BASIC METABOLIC PANEL
BUN: 18 mg/dL (ref 6–23)
CO2: 31 mEq/L (ref 19–32)
Calcium: 9.3 mg/dL (ref 8.4–10.5)
Chloride: 101 mEq/L (ref 96–112)
Creatinine, Ser: 0.6 mg/dL (ref 0.4–1.2)
GFR: 105.42 mL/min (ref >60.00)
Glucose, Bld: 121 mg/dL — ABNORMAL HIGH (ref 70–99)
Potassium: 3.5 mEq/L (ref 3.5–5.1)
Sodium: 142 mEq/L (ref 135–145)

## 2011-04-18 LAB — LIPID PANEL
Cholesterol: 207 mg/dL — ABNORMAL HIGH (ref 0–200)
HDL: 40.3 mg/dL (ref >39.00)
Total CHOL/HDL Ratio: 5
Triglycerides: 177 mg/dL — ABNORMAL HIGH (ref 0.0–149.0)
VLDL: 35.4 mg/dL (ref 0.0–40.0)

## 2011-04-18 LAB — LDL CHOLESTEROL, DIRECT: Direct LDL: 152.1 mg/dL

## 2011-04-18 NOTE — Progress Notes (Signed)
50 yo female here for CPX/pap.   HTN- has been on antihypertensive for over 10 years.  Very strong family h/o HTN and CAD.  Currently taking Atenolol-chlorthal 50-25- 1 tab daily.  No CP, blurred vision, or SOB. Lab Results  Component Value Date   CREATININE 0.7 03/15/2010    HLD- on Trilipix 135 mg daily. Could not tolerate Niacin and is afraid to try statins because of all that she has heard about them.  Well woman- due for mammogram (sees Dr. Vincente Poli). Family h/o colono CA, followed by Dr. Loreta Ave.  Colonoscopy every 5 years- due  Patient Active Problem List  Diagnoses  . HYPERLIPIDEMIA  . ESSENTIAL HYPERTENSION, BENIGN  . HYPERTENSION  . FATIGUE  . URINARY INCONTINENCE  . Routine general medical examination at a health care facility   No past medical history on file. No past surgical history on file. History  Substance Use Topics  . Smoking status: Not on file  . Smokeless tobacco: Not on file  . Alcohol Use: Not on file   No family history on file. Allergies  Allergen Reactions  . Niacin     REACTION: flushing, SOB face numb   Current Outpatient Prescriptions on File Prior to Visit  Medication Sig Dispense Refill  . atenolol-chlorthalidone (TENORETIC) 50-25 MG per tablet Take 1 tablet by mouth daily.  30 tablet  0  . Choline Fenofibrate (TRILIPIX) 135 MG capsule Take 1 capsule (135 mg total) by mouth daily.  30 capsule  0   The PMH, PSH, Social History, Family History, Medications, and allergies have been reviewed in [facility], and have been updated if relevant.  Review of Systems       See HPI Patient reports no  vision/ hearing changes,anorexia, weight change, fever ,adenopathy, persistant / recurrent hoarseness, swallowing issues, chest pain, edema,persistant / recurrent cough, hemoptysis, dyspnea(rest, exertional, paroxysmal nocturnal), gastrointestinal  bleeding (melena, rectal bleeding), abdominal pain, excessive heart burn, GU symptoms(dysuria, hematuria, pyuria,  voiding/incontinence  Issues) syncope, focal weakness, severe memory loss, concerning skin lesions, depression, anxiety, abnormal bruising/bleeding, major joint swelling, breast masses or abnormal vaginal bleeding.     Physical Exam BP 130/80  Pulse 72  Temp(Src) 97.8 F (36.6 C) (Oral)  Ht 5\' 10"  (1.778 m)  Wt 249 lb (112.946 kg)  BMI 35.73 kg/m2  General:  alert and overweight-appearing.   Head:  normocephalic and atraumatic.   Eyes:  vision grossly intact, pupils equal, and pupils round.   Ears:  R ear normal and L ear normal.   Nose:  no external deformity.   Mouth:  good dentition.   Breasts:  No mass, nodules, thickening, tenderness, bulging, retraction, inflamation, nipple discharge or skin changes noted.   Lungs:  Normal respiratory effort, chest expands symmetrically. Lungs are clear to auscultation, no crackles or wheezes. Heart:  Normal rate and regular rhythm. S1 and S2 normal without gallop, murmur, click, rub or other extra sounds. Abdomen:  Bowel sounds positive,abdomen soft and non-tender without masses, organomegaly or hernias noted. Msk:  No deformity or scoliosis noted of thoracic or lumbar spine.   Extremities:  no edema Neurologic:  alert & oriented X3.   Skin:  Intact without suspicious lesions or rashes Psych:  Oriented X3, memory intact for recent and remote, normally interactive, good eye contact, not anxious appearing, and not depressed appearing.    Assessment and Plan: 1. HYPERLIPIDEMIA  Recheck labs today- consider adding Pravachol.  After discussing, she would be agreeable. Lipid Profile, Hepatic function panel  2.  HYPERTENSION  Stable.  Continue current dose of Tenoretic. Basic Metabolic Panel (BMET)  3. Routine general medical examination at a health care facility  Reviewed preventive care protocols, scheduled due services, and updated immunizations Discussed nutrition, exercise, diet, and healthy lifestyle.  GI referral for colonoscopy  4.  Screening for colon cancer  Ambulatory referral to Gastroenterology

## 2011-04-18 NOTE — Patient Instructions (Signed)
Good to see you. Please make an appt with Dr. Vincente Poli for your mammogram and pap smear. Please call if your area on your bottom gets more painful. Have a wonderful Thanksgiving.

## 2011-05-22 ENCOUNTER — Telehealth: Payer: Self-pay | Admitting: Internal Medicine

## 2011-05-22 MED ORDER — ATENOLOL-CHLORTHALIDONE 50-25 MG PO TABS: 1.0000 | ORAL_TABLET | Freq: Every day | ORAL | Status: DC

## 2011-05-22 MED ORDER — CHOLINE FENOFIBRATE 135 MG PO CPDR: 135.0000 mg | DELAYED_RELEASE_CAPSULE | Freq: Every day | ORAL | Status: DC

## 2011-05-22 NOTE — Telephone Encounter (Signed)
Rx sent to pharmacy   

## 2011-06-20 ENCOUNTER — Other Ambulatory Visit: Payer: Self-pay | Admitting: Internal Medicine

## 2011-06-20 MED ORDER — ATENOLOL-CHLORTHALIDONE 50-25 MG PO TABS: 1.0000 | ORAL_TABLET | Freq: Every day | ORAL | Status: DC

## 2011-06-20 MED ORDER — CHOLINE FENOFIBRATE 135 MG PO CPDR: 135.0000 mg | DELAYED_RELEASE_CAPSULE | Freq: Every day | ORAL | Status: DC

## 2011-06-20 NOTE — Telephone Encounter (Signed)
Faxed refill requests into pharmacy.

## 2011-07-23 ENCOUNTER — Other Ambulatory Visit: Payer: Self-pay | Admitting: *Deleted

## 2011-07-23 MED ORDER — ATENOLOL-CHLORTHALIDONE 50-25 MG PO TABS: 1.0000 | ORAL_TABLET | Freq: Every day | ORAL | Status: DC

## 2011-07-23 MED ORDER — CHOLINE FENOFIBRATE 135 MG PO CPDR: 135.0000 mg | DELAYED_RELEASE_CAPSULE | Freq: Every day | ORAL | Status: DC

## 2011-07-30 ENCOUNTER — Ambulatory Visit (INDEPENDENT_AMBULATORY_CARE_PROVIDER_SITE_OTHER): Payer: 59 | Admitting: Family Medicine

## 2011-07-30 ENCOUNTER — Encounter: Payer: Self-pay | Admitting: Family Medicine

## 2011-07-30 VITALS — BP 140/80 | HR 64 | Temp 98.1°F | Wt 235.0 lb

## 2011-07-30 DIAGNOSIS — I10 Essential (primary) hypertension: Secondary | ICD-10-CM

## 2011-07-30 DIAGNOSIS — E785 Hyperlipidemia, unspecified: Secondary | ICD-10-CM

## 2011-07-30 DIAGNOSIS — R739 Hyperglycemia, unspecified: Secondary | ICD-10-CM | POA: Insufficient documentation

## 2011-07-30 DIAGNOSIS — R7309 Other abnormal glucose: Secondary | ICD-10-CM

## 2011-07-30 LAB — LIPID PANEL
Cholesterol: 186 mg/dL (ref 0–200)
HDL: 44.1 mg/dL (ref >39.00)
LDL Cholesterol: 113 mg/dL — ABNORMAL HIGH (ref 0–99)
Total CHOL/HDL Ratio: 4
Triglycerides: 147 mg/dL (ref 0.0–149.0)
VLDL: 29.4 mg/dL (ref 0.0–40.0)

## 2011-07-30 LAB — BASIC METABOLIC PANEL
BUN: 17 mg/dL (ref 6–23)
CO2: 32 mEq/L (ref 19–32)
Calcium: 9.4 mg/dL (ref 8.4–10.5)
Chloride: 105 mEq/L (ref 96–112)
Creatinine, Ser: 0.6 mg/dL (ref 0.4–1.2)
GFR: 105.3 mL/min (ref >60.00)
Glucose, Bld: 103 mg/dL — ABNORMAL HIGH (ref 70–99)
Potassium: 4 mEq/L (ref 3.5–5.1)
Sodium: 142 mEq/L (ref 135–145)

## 2011-07-30 LAB — HEMOGLOBIN A1C: Hgb A1c MFr Bld: 5.9 % (ref 4.6–6.5)

## 2011-07-30 MED ORDER — ATENOLOL-CHLORTHALIDONE 50-25 MG PO TABS: 1.0000 | ORAL_TABLET | Freq: Every day | ORAL | Status: DC

## 2011-07-30 NOTE — Patient Instructions (Signed)
Great to see you.  We will call you with your lab results. 

## 2011-07-30 NOTE — Progress Notes (Signed)
51 yo here for follow up.   HTN- has been on antihypertensive for over 10 years.  Very strong family h/o HTN and CAD.  Currently taking Atenolol-chlorthal 50-25- 1 tab daily.  No CP, blurred vision, or SOB. Lab Results  Component Value Date   CREATININE 0.6 04/18/2011   Hyperglycemia- CBG was 121 last month. Has been working on diet, on weight watchers and has lost 16 pounds according to her scale.  HLD- on Trilipix 135 mg daily. Could not tolerate Niacin and is afraid to try statins because of all that she has heard about them.   Patient Active Problem List  Diagnoses  . HYPERLIPIDEMIA  . ESSENTIAL HYPERTENSION, BENIGN  . HYPERTENSION  . FATIGUE  . URINARY INCONTINENCE  . Routine general medical examination at a health care facility   Past Medical History  Diagnosis Date  . Hyperlipidemia   . Hypertension   . Urinary incontinence    Past Surgical History  Procedure Date  . Appendectomy   . Tonsillectomy    History  Substance Use Topics  . Smoking status: Never Smoker   . Smokeless tobacco: Not on file  . Alcohol Use: Yes   Family History  Problem Relation Age of Onset  . Cancer Mother     colon  . Heart disease Father     s/p stents in the 60's  . Hypertension Sister    Allergies  Allergen Reactions  . Niacin     REACTION: flushing, SOB face numb   Current Outpatient Prescriptions on File Prior to Visit  Medication Sig Dispense Refill  . atenolol-chlorthalidone (TENORETIC) 50-25 MG per tablet Take 1 tablet by mouth daily.  30 tablet  0  . Choline Fenofibrate (TRILIPIX) 135 MG capsule Take 1 capsule (135 mg total) by mouth daily.  30 capsule  0   The PMH, PSH, Social History, Family History, Medications, and allergies have been reviewed in [facility], and have been updated if relevant.  Review of Systems       See HPI Patient reports no  vision/ hearing changes,anorexia, weight change, fever ,adenopathy, persistant / recurrent hoarseness, swallowing issues,  chest pain, edema,persistant / recurrent cough, hemoptysis, dyspnea(rest, exertional, paroxysmal nocturnal), gastrointestinal  bleeding (melena, rectal bleeding), abdominal pain, excessive heart burn, GU symptoms(dysuria, hematuria, pyuria, voiding/incontinence  Issues) syncope, focal weakness, severe memory loss, concerning skin lesions, depression, anxiety, abnormal bruising/bleeding, major joint swelling, breast masses or abnormal vaginal bleeding.     Physical Exam BP 140/80  Pulse 64  Temp(Src) 98.1 F (36.7 C) (Oral)  Wt 235 lb (106.595 kg)  General:  alert and overweight-appearing.   Head:  normocephalic and atraumatic.   Eyes:  vision grossly intact, pupils equal, and pupils round.   Ears:  R ear normal and L ear normal.   Lungs:  Normal respiratory effort, chest expands symmetrically. Lungs are clear to auscultation, no crackles or wheezes. Heart:  Normal rate and regular rhythm. S1 and S2 normal without gallop, murmur, click, rub or other extra sounds. Abdomen:  Bowel sounds positive,abdomen soft and non-tender without masses, organomegaly or hernias noted. Msk:  No deformity or scoliosis noted of thoracic or lumbar spine.   Extremities:  no edema Neurologic:  alert & oriented X3.   Skin:  Intact without suspicious lesions or rashes Psych:  Oriented X3, memory intact for recent and remote, normally interactive, good eye contact, not anxious appearing, and not depressed appearing.    Assessment and Plan: 1. HYPERLIPIDEMIA  Recheck labs today- consider  adding Pravachol.  After discussing, she would be agreeable. Lipid Profile, Hepatic function panel  2. HYPERTENSION  Stable.  Continue current dose of Tenoretic. Basic Metabolic Panel (BMET)   3.  Hyperglycemia-  Recheck BMET and a1c today. Hopefully improved with weight loss.

## 2011-07-30 NOTE — Progress Notes (Signed)
Addended by: Alvina Chou on: 07/30/2011 09:47 AM   Modules accepted: Orders

## 2011-08-23 ENCOUNTER — Other Ambulatory Visit: Payer: Self-pay

## 2011-08-23 MED ORDER — CHOLINE FENOFIBRATE 135 MG PO CPDR: 135.0000 mg | DELAYED_RELEASE_CAPSULE | Freq: Every day | ORAL | Status: DC

## 2011-08-23 NOTE — Telephone Encounter (Signed)
Pt request refill Trilipix 135 mg #30 x 11 to [facility] as requested. Patient notified as instructed by telephone.

## 2011-09-14 ENCOUNTER — Telehealth: Payer: Self-pay

## 2011-09-14 ENCOUNTER — Ambulatory Visit: Payer: 59 | Admitting: Family Medicine

## 2011-09-14 NOTE — Telephone Encounter (Signed)
Pt walked in with dull constant ache right ear, feels swollen and slight problem hearing out of rt ear. Started last night. Pain level now 7. No S/T,no head congestion,no cough,no fever and no dizziness. Pt's father funeral today at 1pm. Pt uses [facility] and has expired Amoxicillin at home. Explained could not recommend taking expired antibiotic. Dr Milinda Antis agreed to work in with 10am appt and sooner if one of her scheduled pts were late or no show. Pt said she could not wait and I suggested to go to UC. Pt said she would go tomorrow if she was not better.

## 2011-09-19 ENCOUNTER — Ambulatory Visit (INDEPENDENT_AMBULATORY_CARE_PROVIDER_SITE_OTHER): Payer: 59 | Admitting: Family Medicine

## 2011-09-19 ENCOUNTER — Encounter: Payer: Self-pay | Admitting: Family Medicine

## 2011-09-19 VITALS — BP 126/90 | HR 76 | Temp 98.1°F | Wt 226.0 lb

## 2011-09-19 DIAGNOSIS — H66019 Acute suppurative otitis media with spontaneous rupture of ear drum, unspecified ear: Secondary | ICD-10-CM

## 2011-09-19 DIAGNOSIS — H66011 Acute suppurative otitis media with spontaneous rupture of ear drum, right ear: Secondary | ICD-10-CM

## 2011-09-19 MED ORDER — AMOXICILLIN-POT CLAVULANATE 875-125 MG PO TABS: 1.0000 | ORAL_TABLET | Freq: Two times a day (BID) | ORAL | Status: AC

## 2011-09-19 NOTE — Patient Instructions (Signed)
I am so sorry for your loss. Please stop by to see Shirlee Limerick on your way out to set up your ENT referral.

## 2011-09-19 NOTE — Progress Notes (Signed)
  Subjective:    Patient ID: Hannah Giles, female    DOB: [birth year], 51 y.o.   MRN: [id number]  HPI  51 yo here for urgent care follow up.  Friday morning- woke up with severe right sided ear pain.  Unfortunately, this was the same day of her father's funeral.  That evening, went to UC, was given oral cipro 500 mg twice daily and ciprodex drops per patient. Over the weekend, symptoms worsened.  Returned to UC and was given course of oral prednisone which she adjusted dose- only been taking 10 mg daily instead of 30 mg daily. Still cannot hear well out of right ear.  Pain is slightly improved but "something doesn't feel right." Afebrile. No URI symptoms.  Patient Active Problem List  Diagnoses  . HYPERLIPIDEMIA  . ESSENTIAL HYPERTENSION, BENIGN  . HYPERTENSION  . FATIGUE  . URINARY INCONTINENCE  . Routine general medical examination at a health care facility  . Hyperglycemia  . Acute suppur right otitis media w/spontan rupture of tympanic membrane   Past Medical History  Diagnosis Date  . Hyperlipidemia   . Hypertension   . Urinary incontinence    Past Surgical History  Procedure Date  . Appendectomy   . Tonsillectomy    History  Substance Use Topics  . Smoking status: Never Smoker   . Smokeless tobacco: Not on file  . Alcohol Use: Yes   Family History  Problem Relation Age of Onset  . Cancer Mother     colon  . Heart disease Father     s/p stents in the 60's  . Hypertension Sister    Allergies  Allergen Reactions  . Niacin     REACTION: flushing, SOB face numb   Current Outpatient Prescriptions on File Prior to Visit  Medication Sig Dispense Refill  . atenolol-chlorthalidone (TENORETIC) 50-25 MG per tablet Take 1 tablet by mouth daily.  30 tablet  11  . Choline Fenofibrate (TRILIPIX) 135 MG capsule Take 1 capsule (135 mg total) by mouth daily.  30 capsule  11  . estradiol (ESTRACE) 0.1 MG/GM vaginal cream Use 3 times a week.       The PMH, PSH, Social  History, Family History, Medications, and allergies have been reviewed in [facility], and have been updated if relevant.  Review of Systems See HPI Pos ear drainage    Objective:   Physical Exam BP 126/90  Pulse 76  Temp(Src) 98.1 F (36.7 C) (Oral)  Wt 226 lb (102.513 kg) Gen:  Alert, pleasant, NAD HEENT:  Right ear:  Unable to visual TM- thick drainage around ear canal Left ear:  TM intact, thick fluid behind TM, dull light reflex Psych:  Good eye contact, appropriate.    Assessment & Plan:   1. Acute suppur right otitis media w/spontan rupture of tympanic membrane  Ambulatory referral to ENT  Probable rupture of TM although very difficult to visualize. Will refer to ENT. Start oral Augmentin, continue cipro dex and oral prednisone. The patient indicates understanding of these issues and agrees with the plan.

## 2012-07-29 ENCOUNTER — Other Ambulatory Visit: Payer: Self-pay

## 2012-07-29 MED ORDER — ATENOLOL-CHLORTHALIDONE 50-25 MG PO TABS: 1.0000 | ORAL_TABLET | Freq: Every day | ORAL | Status: DC

## 2012-07-29 NOTE — Telephone Encounter (Signed)
Tom with [facility] left v/m requesting refill tenoretic. Pt already has CPX 09/16/12. #30 x 1.

## 2012-08-15 NOTE — Telephone Encounter (Signed)
Pt called for status of tenoretic refill. Spoke with Tiffany at [facility] and med is available. Advised pt and she will pick up today.

## 2012-09-16 ENCOUNTER — Ambulatory Visit (INDEPENDENT_AMBULATORY_CARE_PROVIDER_SITE_OTHER): Payer: 59 | Admitting: Family Medicine

## 2012-09-16 ENCOUNTER — Encounter: Payer: Self-pay | Admitting: Family Medicine

## 2012-09-16 VITALS — BP 120/70 | HR 65 | Temp 98.0°F | Ht 70.0 in | Wt 224.0 lb

## 2012-09-16 DIAGNOSIS — Z0000 Encounter for general adult medical examination without abnormal findings: Secondary | ICD-10-CM

## 2012-09-16 DIAGNOSIS — R739 Hyperglycemia, unspecified: Secondary | ICD-10-CM

## 2012-09-16 DIAGNOSIS — I10 Essential (primary) hypertension: Secondary | ICD-10-CM

## 2012-09-16 DIAGNOSIS — R7309 Other abnormal glucose: Secondary | ICD-10-CM

## 2012-09-16 DIAGNOSIS — E785 Hyperlipidemia, unspecified: Secondary | ICD-10-CM

## 2012-09-16 LAB — COMPREHENSIVE METABOLIC PANEL
ALT: 25 U/L (ref 0–35)
AST: 21 U/L (ref 0–37)
Albumin: 4.2 g/dL (ref 3.5–5.2)
Alkaline Phosphatase: 52 U/L (ref 39–117)
BUN: 19 mg/dL (ref 6–23)
CO2: 30 mEq/L (ref 19–32)
Calcium: 9.3 mg/dL (ref 8.4–10.5)
Chloride: 98 mEq/L (ref 96–112)
Creatinine, Ser: 0.7 mg/dL (ref 0.4–1.2)
GFR: 101.13 mL/min (ref >60.00)
Glucose, Bld: 112 mg/dL — ABNORMAL HIGH (ref 70–99)
Potassium: 3.9 mEq/L (ref 3.5–5.1)
Sodium: 137 mEq/L (ref 135–145)
Total Bilirubin: 0.5 mg/dL (ref 0.3–1.2)
Total Protein: 7.5 g/dL (ref 6.0–8.3)

## 2012-09-16 LAB — LIPID PANEL
Cholesterol: 221 mg/dL — ABNORMAL HIGH (ref 0–200)
HDL: 42.6 mg/dL (ref >39.00)
Total CHOL/HDL Ratio: 5
Triglycerides: 124 mg/dL (ref 0.0–149.0)
VLDL: 24.8 mg/dL (ref 0.0–40.0)

## 2012-09-16 LAB — HEMOGLOBIN A1C: Hgb A1c MFr Bld: 5.6 % (ref 4.6–6.5)

## 2012-09-16 LAB — LDL CHOLESTEROL, DIRECT: Direct LDL: 156.8 mg/dL

## 2012-09-16 MED ORDER — ATENOLOL-CHLORTHALIDONE 50-25 MG PO TABS: 1.0000 | ORAL_TABLET | Freq: Every day | ORAL | Status: DC

## 2012-09-16 MED ORDER — CHOLINE FENOFIBRATE 135 MG PO CPDR: 135.0000 mg | DELAYED_RELEASE_CAPSULE | Freq: Every day | ORAL | Status: DC

## 2012-09-16 NOTE — Progress Notes (Signed)
52  yo pleasant female here for CPX.  Doing well.  Has lost 11 pounds since 07/2011.  Originally lost more but has gained some back.  HTN- has been on antihypertensive for over 10 years.  Very strong family h/o HTN and CAD.  Currently taking Atenolol-chlorthal 50-25- 1 tab daily.  No CP, blurred vision, or SOB. Lab Results  Component Value Date   CREATININE 0.6 07/30/2011   HLD- on Trilipix 135 mg daily. Could not tolerate Niacin and is afraid to try statins because of all that she has heard about them.  Well woman- due for mammogram (sees Dr. Vincente Poli).  She will make appt to see her.  Family h/o colono CA, followed by Dr. Loreta Ave.  Colonoscopy every 5 years- due.  She did not schedule it last year.  Rough year with her father passing away.  Patient Active Problem List  Diagnosis  . HYPERLIPIDEMIA  . HYPERTENSION  . FATIGUE  . URINARY INCONTINENCE  . Routine general medical examination at a health care facility  . Hyperglycemia   Past Medical History  Diagnosis Date  . Hyperlipidemia   . Hypertension   . Urinary incontinence    Past Surgical History  Procedure Laterality Date  . Appendectomy    . Tonsillectomy     History  Substance Use Topics  . Smoking status: Never Smoker   . Smokeless tobacco: Not on file  . Alcohol Use: Yes   Family History  Problem Relation Age of Onset  . Cancer Mother     colon  . Heart disease Father     s/p stents in the 60's  . Hypertension Sister    Allergies  Allergen Reactions  . Niacin     REACTION: flushing, SOB face numb   Current Outpatient Prescriptions on File Prior to Visit  Medication Sig Dispense Refill  . atenolol-chlorthalidone (TENORETIC) 50-25 MG per tablet Take 1 tablet by mouth daily.  30 tablet  1  . Choline Fenofibrate (TRILIPIX) 135 MG capsule Take 1 capsule (135 mg total) by mouth daily.  30 capsule  11  . estradiol (ESTRACE) 0.1 MG/GM vaginal cream Use 3 times a week.       No current facility-administered  medications on file prior to visit.   The PMH, PSH, Social History, Family History, Medications, and allergies have been reviewed in [facility], and have been updated if relevant.  Review of Systems       See HPI Patient reports no  vision/ hearing changes,anorexia, weight change, fever ,adenopathy, persistant / recurrent hoarseness, swallowing issues, chest pain, edema,persistant / recurrent cough, hemoptysis, dyspnea(rest, exertional, paroxysmal nocturnal), gastrointestinal  bleeding (melena, rectal bleeding), abdominal pain, excessive heart burn, GU symptoms(dysuria, hematuria, pyuria, voiding/incontinence  Issues) syncope, focal weakness, severe memory loss, concerning skin lesions, depression, anxiety, abnormal bruising/bleeding, major joint swelling, breast masses or abnormal vaginal bleeding.     Physical Exam BP 120/70  Pulse 65  Temp(Src) 98 F (36.7 C)  Ht 5\' 10"  (1.778 m)  Wt 224 lb (101.606 kg)  BMI 32.14 kg/m2 Wt Readings from Last 3 Encounters:  09/16/12 224 lb (101.606 kg)  09/19/11 226 lb (102.513 kg)  07/30/11 235 lb (106.595 kg)     General:  alert and overweight-appearing.   Head:  normocephalic and atraumatic.   Eyes:  vision grossly intact, pupils equal, and pupils round.   Ears:  R ear normal and L ear normal.   Nose:  no external deformity.   Mouth:  good dentition.  Lungs:  Normal respiratory effort, chest expands symmetrically. Lungs are clear to auscultation, no crackles or wheezes. Heart:  Normal rate and regular rhythm. S1 and S2 normal without gallop, murmur, click, rub or other extra sounds. Abdomen:  Bowel sounds positive,abdomen soft and non-tender without masses, organomegaly or hernias noted. Msk:  No deformity or scoliosis noted of thoracic or lumbar spine.   Extremities:  no edema Neurologic:  alert & oriented X3.   Skin:  Intact without suspicious lesions or rashes Psych:  Oriented X3, memory intact for recent and remote, normally interactive, good  eye contact, not anxious appearing, and not depressed appearing.    Assessment and Plan:  1. Routine general medical examination at a health care facility Reviewed preventive care protocols, scheduled due services, and updated immunizations Discussed nutrition, exercise, diet, and healthy lifestyle.  - Comprehensive metabolic panel  2. HYPERTENSION Stable on current meds. Rx refilled.  3. HYPERLIPIDEMIA On Trilipx 135 mg daily. - Lipid Panel  4. Hyperglycemia  - Hemoglobin A1c

## 2012-09-16 NOTE — Patient Instructions (Addendum)
Good to see you. We will call you with your lab results.  Try Debrox over the counter.

## 2012-09-17 ENCOUNTER — Encounter: Payer: Self-pay | Admitting: *Deleted

## 2012-12-11 ENCOUNTER — Other Ambulatory Visit: Payer: Self-pay | Admitting: Obstetrics and Gynecology

## 2012-12-18 ENCOUNTER — Other Ambulatory Visit: Payer: Self-pay | Admitting: Obstetrics and Gynecology

## 2012-12-18 DIAGNOSIS — R928 Other abnormal and inconclusive findings on diagnostic imaging of breast: Secondary | ICD-10-CM

## 2012-12-31 ENCOUNTER — Ambulatory Visit
Admission: RE | Admit: 2012-12-31 | Discharge: 2012-12-31 | Disposition: A | Discharge status: Home / Self Care | Payer: 59 | Source: Ambulatory Visit | Attending: Obstetrics and Gynecology | Admitting: Obstetrics and Gynecology

## 2012-12-31 DIAGNOSIS — R928 Other abnormal and inconclusive findings on diagnostic imaging of breast: Secondary | ICD-10-CM

## 2013-01-06 ENCOUNTER — Other Ambulatory Visit: Payer: 59

## 2013-03-27 ENCOUNTER — Other Ambulatory Visit: Payer: Self-pay | Admitting: Family Medicine

## 2013-03-27 NOTE — Telephone Encounter (Signed)
Last office visit 09/16/2012.  Ok to refill? 

## 2013-04-09 ENCOUNTER — Other Ambulatory Visit: Payer: Self-pay

## 2013-08-31 ENCOUNTER — Emergency Department: Payer: Self-pay | Admitting: Emergency Medicine

## 2013-09-01 LAB — COMPREHENSIVE METABOLIC PANEL
Albumin: 3.7 g/dL (ref 3.4–5.0)
Alkaline Phosphatase: 61 U/L
Anion Gap: 5 — ABNORMAL LOW (ref 7–16)
BUN: 18 mg/dL (ref 7–18)
Bilirubin,Total: 0.3 mg/dL (ref 0.2–1.0)
Calcium, Total: 9.3 mg/dL (ref 8.5–10.1)
Chloride: 100 mmol/L (ref 98–107)
Co2: 33 mmol/L — ABNORMAL HIGH (ref 21–32)
Creatinine: 0.89 mg/dL (ref 0.60–1.30)
EGFR (African American): >60
EGFR (Non-African Amer.): >60
Glucose: 208 mg/dL — ABNORMAL HIGH (ref 65–99)
Osmolality: 284 (ref 275–301)
Potassium: 3 mmol/L — ABNORMAL LOW (ref 3.5–5.1)
SGOT(AST): 20 U/L (ref 15–37)
SGPT (ALT): 34 U/L (ref 12–78)
Sodium: 138 mmol/L (ref 136–145)
Total Protein: 7.6 g/dL (ref 6.4–8.2)

## 2013-09-01 LAB — URINALYSIS, COMPLETE
Bilirubin,UR: NEGATIVE
Glucose,UR: 50 mg/dL (ref 0–75)
Ketone: NEGATIVE
Nitrite: POSITIVE
Ph: 9 (ref 4.5–8.0)
Protein: 100
RBC,UR: 570 /HPF (ref 0–5)
Specific Gravity: 1.015 (ref 1.003–1.030)
Squamous Epithelial: 1
WBC UR: 376 /HPF (ref 0–5)

## 2013-09-01 LAB — CBC
HCT: 40.9 % (ref 35.0–47.0)
HGB: 13.8 g/dL (ref 12.0–16.0)
MCH: 28.9 pg (ref 26.0–34.0)
MCHC: 33.8 g/dL (ref 32.0–36.0)
MCV: 85 fL (ref 80–100)
Platelet: 216 10*3/uL (ref 150–440)
RBC: 4.8 10*6/uL (ref 3.80–5.20)
RDW: 13.9 % (ref 11.5–14.5)
WBC: 14.2 10*3/uL — ABNORMAL HIGH (ref 3.6–11.0)

## 2013-09-02 ENCOUNTER — Ambulatory Visit (INDEPENDENT_AMBULATORY_CARE_PROVIDER_SITE_OTHER): Payer: 59 | Admitting: Internal Medicine

## 2013-09-02 ENCOUNTER — Encounter: Payer: Self-pay | Admitting: Internal Medicine

## 2013-09-02 VITALS — BP 134/86 | HR 74 | Temp 98.7°F | Wt 246.5 lb

## 2013-09-02 DIAGNOSIS — N10 Acute tubulo-interstitial nephritis: Secondary | ICD-10-CM

## 2013-09-02 NOTE — Patient Instructions (Addendum)
Urinary Tract Infection  Urinary tract infections (UTIs) can develop anywhere along your urinary tract. Your urinary tract is your body's drainage system for removing wastes and extra water. Your urinary tract includes two kidneys, two ureters, a bladder, and a urethra. Your kidneys are a pair of bean-shaped organs. Each kidney is about the size of your fist. They are located below your ribs, one on each side of your spine.  CAUSES  Infections are caused by microbes, which are microscopic organisms, including fungi, viruses, and bacteria. These organisms are so small that they can only be seen through a microscope. Bacteria are the microbes that most commonly cause UTIs.  SYMPTOMS   Symptoms of UTIs may vary by age and gender of the patient and by the location of the infection. Symptoms in young women typically include a frequent and intense urge to urinate and a painful, burning feeling in the bladder or urethra during urination. Older women and men are more likely to be tired, shaky, and weak and have muscle aches and abdominal pain. A fever may mean the infection is in your kidneys. Other symptoms of a kidney infection include pain in your back or sides below the ribs, nausea, and vomiting.  DIAGNOSIS  To diagnose a UTI, your caregiver will ask you about your symptoms. Your caregiver also will ask to provide a urine sample. The urine sample will be tested for bacteria and white blood cells. White blood cells are made by your body to help fight infection.  TREATMENT   Typically, UTIs can be treated with medication. Because most UTIs are caused by a bacterial infection, they usually can be treated with the use of antibiotics. The choice of antibiotic and length of treatment depend on your symptoms and the type of bacteria causing your infection.  HOME CARE INSTRUCTIONS   If you were prescribed antibiotics, take them exactly as your caregiver instructs you. Finish the medication even if you feel better after you  have only taken some of the medication.   Drink enough water and fluids to keep your urine clear or pale yellow.   Avoid caffeine, tea, and carbonated beverages. They tend to irritate your bladder.   Empty your bladder often. Avoid holding urine for long periods of time.   Empty your bladder before and after sexual intercourse.   After a bowel movement, women should cleanse from front to back. Use each tissue only once.  SEEK MEDICAL CARE IF:    You have back pain.   You develop a fever.   Your symptoms do not begin to resolve within 3 days.  SEEK IMMEDIATE MEDICAL CARE IF:    You have severe back pain or lower abdominal pain.   You develop chills.   You have nausea or vomiting.   You have continued burning or discomfort with urination.  MAKE SURE YOU:    Understand these instructions.   Will watch your condition.   Will get help right away if you are not doing well or get worse.  Document Released: 02/28/2005 Document Revised: 11/20/2011 Document Reviewed: 06/29/2011  [employer] Patient Information 2014 [employer].

## 2013-09-02 NOTE — Progress Notes (Signed)
Pre visit review using our clinic review tool, if applicable. No additional management support is needed unless otherwise documented below in the visit note. 

## 2013-09-02 NOTE — Progress Notes (Signed)
Subjective:    Patient ID: Hannah Giles, female    DOB: August 15, 1960, 53 y.o.   MRN: [id number]  HPI  Pt presents to the clinic today for hospital followup. She went to the ER on 08/31/13 with frequency, urgency, chills, nausea and flank pain. This had been going on for 3 days. She was seen before that and given Cipro for a UTI. Her urinalysis showed pos blood, leuks and nitrites. She was diagnosed with pyelonephritis and started on Suprax. She was also given Rocephin IM. Since that time, she continues to have flank pain. She overall feels uncomfortable. Her biggest concern is that she is supposed to go to [place] this Friday and wonders if she will be well enough to travel.  Review of Systems      Past Medical History  Diagnosis Date  . Hyperlipidemia   . Hypertension   . Urinary incontinence     Current Outpatient Prescriptions  Medication Sig Dispense Refill  . atenolol-chlorthalidone (TENORETIC) 50-25 MG per tablet TAKE 1 TABLET BY MOUTH DAILY.  90 tablet  1  . cefixime (SUPRAX) 400 MG tablet Take 400 mg by mouth daily. For 10 days      . Choline Fenofibrate (FENOFIBRIC ACID) 135 MG CPDR TAKE 1 CAPSULE (135 MG TOTAL) BY MOUTH DAILY.  90 capsule  1   No current facility-administered medications for this visit.    Allergies  Allergen Reactions  . Niacin     REACTION: flushing, SOB face numb    Family History  Problem Relation Age of Onset  . Cancer Mother     colon  . Heart disease Father     s/p stents in the 60's  . Hypertension Sister     History   Social History  . Marital Status: Married    Spouse Name: N/A    Number of Children: N/A  . Years of Education: N/A   Occupational History  . RN, [employer] History Main Topics  . Smoking status: Former Research scientist (life sciences)  . Smokeless tobacco: Not on file  . Alcohol Use: Yes  . Drug Use: Not on file  . Sexual Activity: Not on file   Other Topics Concern  . Not on file   Social History Narrative  .  No narrative on file     Constitutional: Pt reports fatigue. Denies fever, malaise,  headache or abrupt weight changes.   GU: Pt reports flank pain. Denies urgency, frequency, pain with urination, burning sensation, blood in urine, odor or discharge.   No other specific complaints in a complete review of systems (except as listed in HPI above).  Objective:   Physical Exam   BP 134/86  Pulse 74  Temp(Src) 98.7 F (37.1 C) (Oral)  Wt 246 lb 8 oz (111.812 kg)  SpO2 97% Wt Readings from Last 3 Encounters:  09/02/13 246 lb 8 oz (111.812 kg)  09/16/12 224 lb (101.606 kg)  09/19/11 226 lb (102.513 kg)    General: Appears her stated age, well developed, well nourished in NAD. Cardiovascular: Normal rate and rhythm. S1,S2 noted.  No murmur, rubs or gallops noted. No JVD or BLE edema. No carotid bruits noted. Pulmonary/Chest: Normal effort and positive vesicular breath sounds. No respiratory distress. No wheezes, rales or ronchi noted.  Abdomen: Soft and nontender. Normal bowel sounds, no bruits noted. No distention or masses noted. Liver, spleen and kidneys non palpable. No CVA tenderness  BMET    Component Value Date/Time   NA  137 09/16/2012 0845   K 3.9 09/16/2012 0845   CL 98 09/16/2012 0845   CO2 30 09/16/2012 0845   GLUCOSE 112* 09/16/2012 0845   BUN 19 09/16/2012 0845   CREATININE 0.7 09/16/2012 0845   CALCIUM 9.3 09/16/2012 0845   GFRNONAA 87.93 03/15/2010 0812   GFRAA  Value: >60        The eGFR has been calculated using the MDRD equation. This calculation has not been validated in all clinical 11/07/2007 0915    Lipid Panel     Component Value Date/Time   CHOL 221* 09/16/2012 0845   TRIG 124.0 09/16/2012 0845   HDL 42.60 09/16/2012 0845   CHOLHDL 5 09/16/2012 0845   VLDL 24.8 09/16/2012 0845   LDLCALC 113* 07/30/2011 0803    CBC    Component Value Date/Time   WBC 11.6* 10/27/2009 1127   RBC 4.70 10/27/2009 1127   HGB 14.0 10/27/2009 1127   HCT 40.8 10/27/2009 1127   PLT  259.0 10/27/2009 1127   MCV 86.7 10/27/2009 1127   MCHC 34.3 10/27/2009 1127   RDW 15.1* 10/27/2009 1127   LYMPHSABS 3.5 10/27/2009 1127   MONOABS 0.4 10/27/2009 1127   EOSABS 0.1 10/27/2009 1127   BASOSABS 0.0 10/27/2009 1127    Hgb A1C Lab Results  Component Value Date   HGBA1C 5.6 09/16/2012        Assessment & Plan:   Acute Pyelonephritis:  Continue the Suprax until it is finished Zofran if needed for nausea (already has RX) Drink plenty of fluids to flush out your kidneys Red flag s/s discussed I think you will be okay to travel  RTC as needed or if symptoms persist or worsen

## 2013-09-03 ENCOUNTER — Telehealth: Payer: Self-pay

## 2013-09-03 LAB — URINE CULTURE

## 2013-09-03 NOTE — Telephone Encounter (Signed)
Per verbal order from [facility], pt is aware that Ax given in ER is appropriate for treatment

## 2013-10-06 ENCOUNTER — Other Ambulatory Visit: Payer: Self-pay | Admitting: Internal Medicine

## 2013-10-06 NOTE — Telephone Encounter (Signed)
Ok to refill one time only.  Needs CPX for further refills.

## 2013-10-06 NOTE — Telephone Encounter (Signed)
Last office visit with you was 09/2012 CPE--Pt did see Webb Silversmith 09/2012 for acute visit-- please advise if okay to refill

## 2013-10-07 ENCOUNTER — Other Ambulatory Visit: Payer: Self-pay | Admitting: Family Medicine

## 2013-10-07 DIAGNOSIS — I10 Essential (primary) hypertension: Secondary | ICD-10-CM

## 2013-10-07 DIAGNOSIS — E785 Hyperlipidemia, unspecified: Secondary | ICD-10-CM

## 2013-10-07 DIAGNOSIS — Z0000 Encounter for general adult medical examination without abnormal findings: Secondary | ICD-10-CM

## 2013-10-12 ENCOUNTER — Other Ambulatory Visit: Payer: 59

## 2013-10-19 ENCOUNTER — Encounter: Payer: 59 | Admitting: Family Medicine

## 2013-11-06 ENCOUNTER — Other Ambulatory Visit: Payer: Self-pay | Admitting: Family Medicine

## 2013-11-08 IMAGING — MG MM DIANOSTIC UNILATERAL R
2 series · 2 of 2 positions shown · non-contrast
Comparison: Previous examinations.

CLINICAL DATA: Recall from screening mammography.

DIGITAL DIAGNOSTIC RIGHT BREAST MAMMOGRAM  AND RIGHT BREAST
ULTRASOUND:

[R CC]
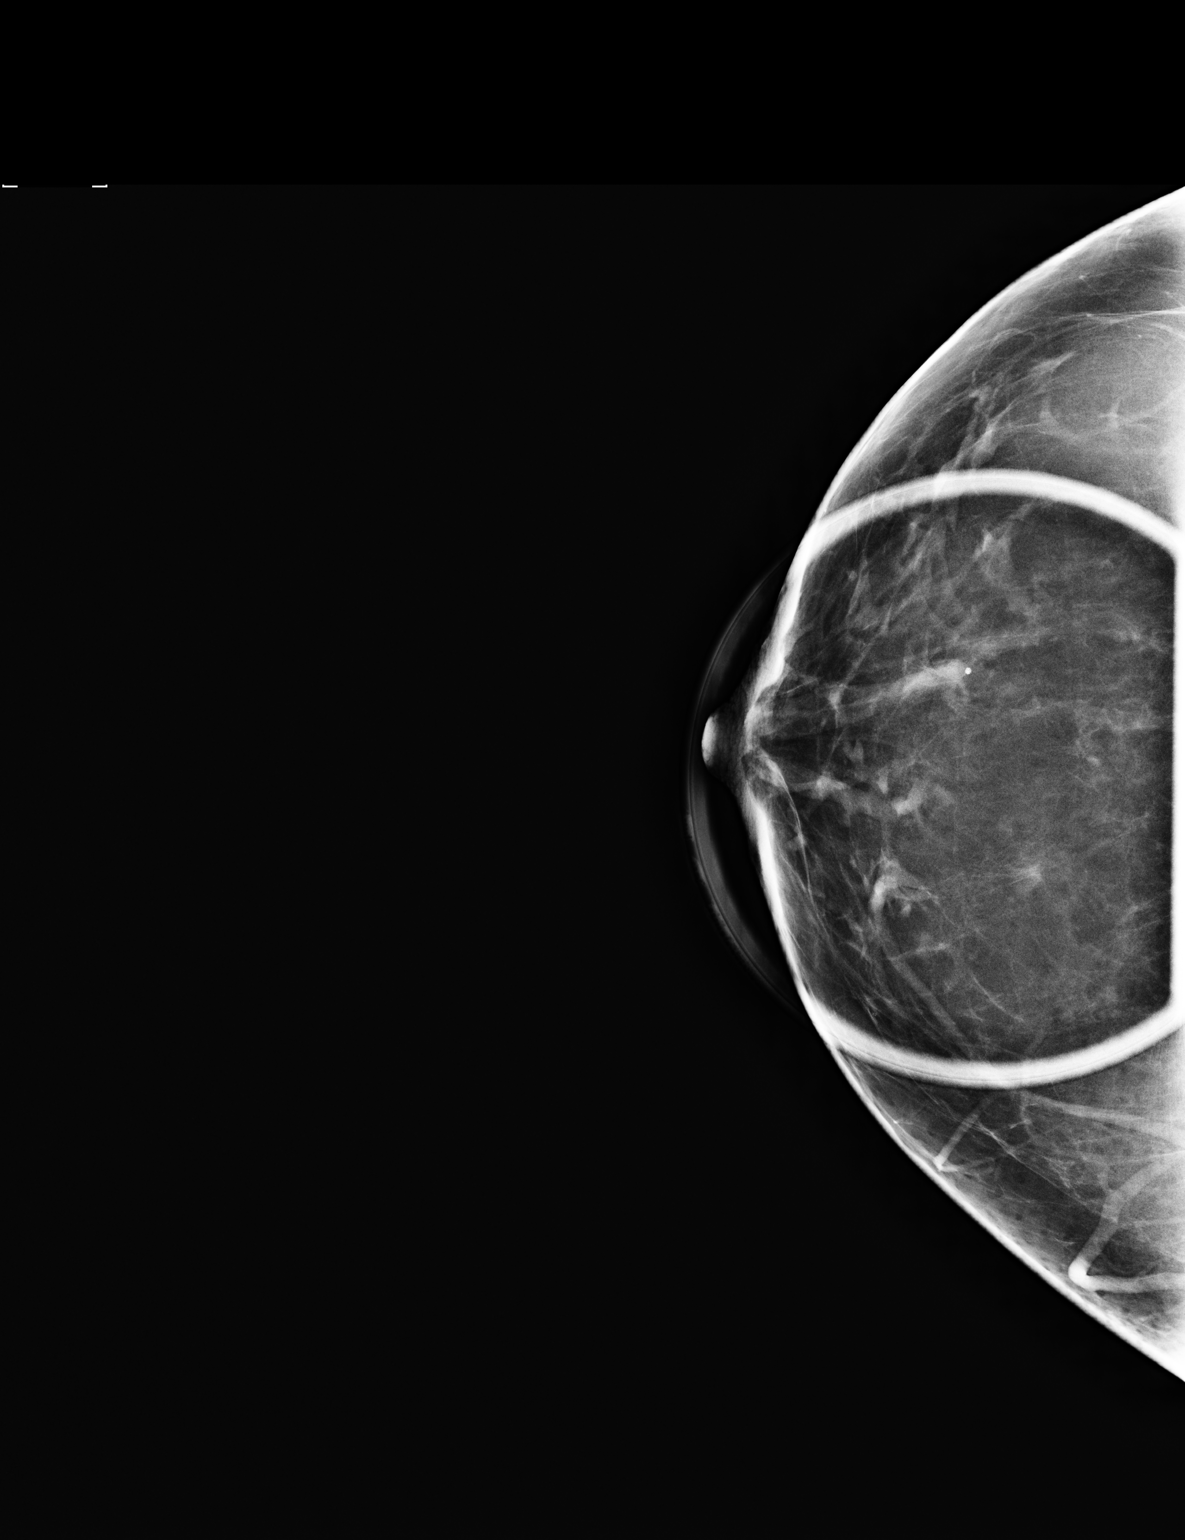

[R MLO]
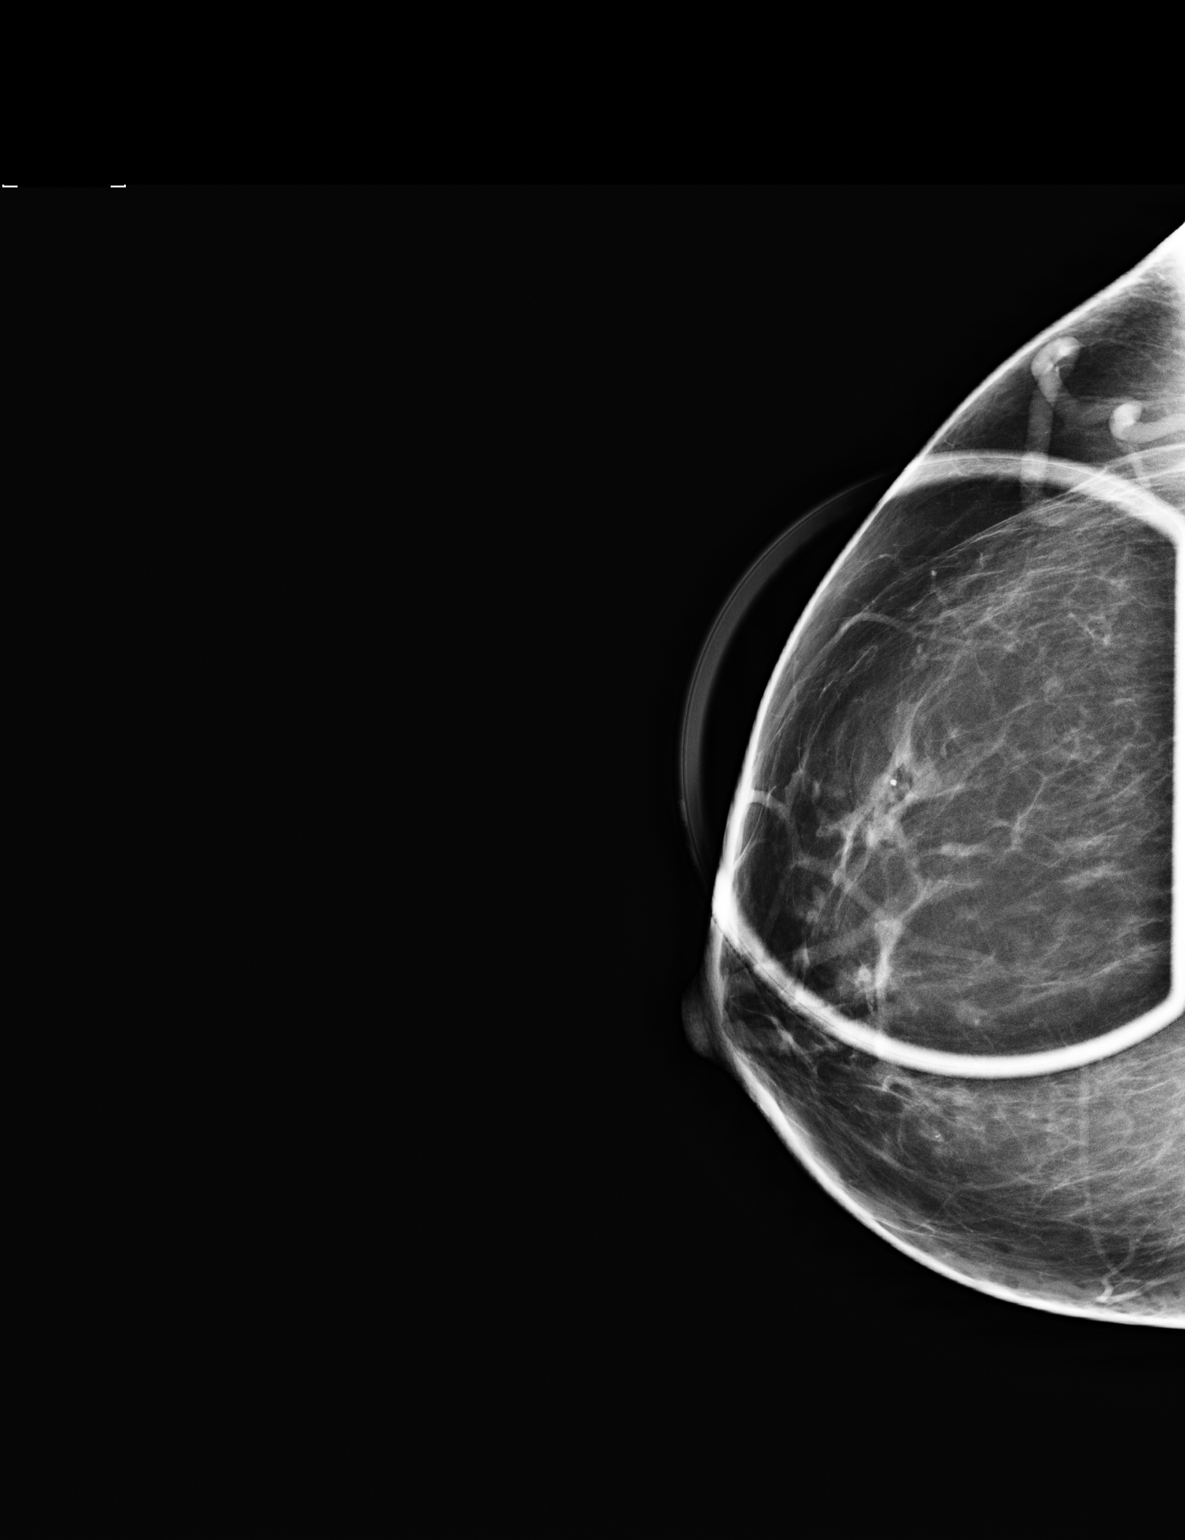

[2 of 2 positions shown; findings below may reference images not displayed]

FINDINGS: ACR Breast Density Category b:  There are scattered areas of
fibroglandular density.

Additional views of the right breast do not demonstrate persistent
abnormality.  The appearance is most consistent with a summation
shadow.

On physical exam, there is no discrete palpable abnormality within
the superior portion of the right breast.

Ultrasound is performed, showing normal-appearing fibroglandular
and fibrofatty tissue.
IMPRESSION: No findings worrisome for malignancy.  The appearance noted on the
screening study is most consistent with a summation shadow.

RECOMMENDATION:
Screening mammography in 1 year.

I have discussed the findings and recommendations with the patient.
Results were also provided in writing at the conclusion of the
visit.  If applicable, a reminder letter will be sent to the
patient regarding the next appointment.

BI-RADS CATEGORY 1:  Negative.

## 2013-11-16 ENCOUNTER — Other Ambulatory Visit (INDEPENDENT_AMBULATORY_CARE_PROVIDER_SITE_OTHER): Payer: 59

## 2013-11-16 DIAGNOSIS — E785 Hyperlipidemia, unspecified: Secondary | ICD-10-CM

## 2013-11-16 DIAGNOSIS — Z0000 Encounter for general adult medical examination without abnormal findings: Secondary | ICD-10-CM

## 2013-11-16 LAB — CBC WITH DIFFERENTIAL/PLATELET
Basophils Absolute: 0 10*3/uL (ref 0.0–0.1)
Basophils Relative: 0.3 % (ref 0.0–3.0)
Eosinophils Absolute: 0.1 10*3/uL (ref 0.0–0.7)
Eosinophils Relative: 1.4 % (ref 0.0–5.0)
HCT: 39 % (ref 36.0–46.0)
Hemoglobin: 13.2 g/dL (ref 12.0–15.0)
Lymphocytes Relative: 34 % (ref 12.0–46.0)
Lymphs Abs: 3.2 10*3/uL (ref 0.7–4.0)
MCHC: 33.9 g/dL (ref 30.0–36.0)
MCV: 86.5 fl (ref 78.0–100.0)
Monocytes Absolute: 0.3 10*3/uL (ref 0.1–1.0)
Monocytes Relative: 3.3 % (ref 3.0–12.0)
Neutro Abs: 5.8 10*3/uL (ref 1.4–7.7)
Neutrophils Relative %: 61 % (ref 43.0–77.0)
Platelets: 240 10*3/uL (ref 150.0–400.0)
RBC: 4.51 Mil/uL (ref 3.87–5.11)
RDW: 14.9 % (ref 11.5–15.5)
WBC: 9.6 10*3/uL (ref 4.0–10.5)

## 2013-11-16 LAB — COMPREHENSIVE METABOLIC PANEL
ALT: 33 U/L (ref 0–35)
AST: 27 U/L (ref 0–37)
Albumin: 4.1 g/dL (ref 3.5–5.2)
Alkaline Phosphatase: 50 U/L (ref 39–117)
BUN: 18 mg/dL (ref 6–23)
CO2: 30 mEq/L (ref 19–32)
Calcium: 9.3 mg/dL (ref 8.4–10.5)
Chloride: 99 mEq/L (ref 96–112)
Creatinine, Ser: 0.7 mg/dL (ref 0.4–1.2)
GFR: 89.48 mL/min (ref >60.00)
Glucose, Bld: 146 mg/dL — ABNORMAL HIGH (ref 70–99)
Potassium: 3.6 mEq/L (ref 3.5–5.1)
Sodium: 138 mEq/L (ref 135–145)
Total Bilirubin: 0.4 mg/dL (ref 0.2–1.2)
Total Protein: 7.3 g/dL (ref 6.0–8.3)

## 2013-11-16 LAB — LIPID PANEL
Cholesterol: 202 mg/dL — ABNORMAL HIGH (ref 0–200)
HDL: 42.3 mg/dL (ref >39.00)
LDL Cholesterol: 123 mg/dL — ABNORMAL HIGH (ref 0–99)
NonHDL: 159.7
Total CHOL/HDL Ratio: 5
Triglycerides: 185 mg/dL — ABNORMAL HIGH (ref 0.0–149.0)
VLDL: 37 mg/dL (ref 0.0–40.0)

## 2013-11-16 LAB — TSH: TSH: 1.7 u[IU]/mL (ref 0.35–4.50)

## 2013-11-23 ENCOUNTER — Encounter: Payer: Self-pay | Admitting: Family Medicine

## 2013-11-23 ENCOUNTER — Ambulatory Visit (INDEPENDENT_AMBULATORY_CARE_PROVIDER_SITE_OTHER): Payer: 59 | Admitting: Family Medicine

## 2013-11-23 VITALS — BP 126/74 | HR 73 | Temp 98.1°F | Ht 69.5 in | Wt 246.5 lb

## 2013-11-23 DIAGNOSIS — Z800 Family history of malignant neoplasm of digestive organs: Secondary | ICD-10-CM

## 2013-11-23 DIAGNOSIS — R7309 Other abnormal glucose: Secondary | ICD-10-CM

## 2013-11-23 DIAGNOSIS — Z0000 Encounter for general adult medical examination without abnormal findings: Secondary | ICD-10-CM

## 2013-11-23 DIAGNOSIS — E669 Obesity, unspecified: Secondary | ICD-10-CM | POA: Insufficient documentation

## 2013-11-23 DIAGNOSIS — R739 Hyperglycemia, unspecified: Secondary | ICD-10-CM

## 2013-11-23 DIAGNOSIS — I10 Essential (primary) hypertension: Secondary | ICD-10-CM

## 2013-11-23 DIAGNOSIS — E785 Hyperlipidemia, unspecified: Secondary | ICD-10-CM

## 2013-11-23 MED ORDER — FENOFIBRIC ACID 135 MG PO CPDR: DELAYED_RELEASE_CAPSULE | ORAL | Status: DC

## 2013-11-23 MED ORDER — ATENOLOL-CHLORTHALIDONE 50-25 MG PO TABS: ORAL_TABLET | ORAL | Status: DC

## 2013-11-23 NOTE — Assessment & Plan Note (Signed)
FSBS fasting > 126, could diagnose DM but would like to verify this. Will also check an a1c and urine micro today. If she is a diabetic, will start Metformin and refer to diabetic nutritionist. Orders Placed This Encounter  Procedures  . Basic Metabolic Panel  . Hemoglobin A1c  . Microalbumin / creatinine urine ratio  . Ambulatory referral to Gastroenterology

## 2013-11-23 NOTE — Patient Instructions (Addendum)
We will call you with your colonoscopy appointment.  I will call you with your lab and microalbumin results. If we have to, we will start Metformin and refer you to a diabetic nutritionist.  Keep your appointment with Dr. Helane Rima.

## 2013-11-23 NOTE — Progress Notes (Signed)
53 yo pleasant female here for CPX.  Dr. Helane Rima is OBGYN.  Has appt in Sept 2015. She will have mammogram at that time.  H/o recurrent pyelonephritis- s/p two rounds of Suprax in April and May 2015. No recurrent symptoms since then. Family h/o colon CA.  Due for colonoscopy every 5 years.  Overdue- was seeing Dr. Collene Mares.  Would prefer to see someone else- not pleased with previous experience.  Hyperglycemia- FSBS 146 on CPX labs- per pt was fasting. Admits to not eating right because she works from home.  No physically active. No increased thirst or urination. Denies a family history of DM.  HTN- has been on antihypertensive for over 10 years.  Very strong family h/o HTN and CAD.  Currently taking Atenolol-chlorthal 50-25- 1 tab daily.  No CP, blurred vision, or SOB. Lab Results  Component Value Date   CREATININE 0.7 11/16/2013   HLD- on Trilipix 135 mg daily. Could not tolerate Niacin and is afraid to try statins because of all that she has heard about them. Lab Results  Component Value Date   CHOL 202* 11/16/2013   HDL 42.30 11/16/2013   LDLCALC 123* 11/16/2013   LDLDIRECT 156.8 09/16/2012   TRIG 185.0* 11/16/2013   CHOLHDL 5 11/16/2013   Lab Results  Component Value Date   WBC 9.6 11/16/2013   HGB 13.2 11/16/2013   HCT 39.0 11/16/2013   MCV 86.5 11/16/2013   PLT 240.0 11/16/2013   Lab Results  Component Value Date   CREATININE 0.7 11/16/2013    Obesity-  See above. Wt Readings from Last 3 Encounters:  11/23/13 246 lb 8 oz (111.812 kg)  09/02/13 246 lb 8 oz (111.812 kg)  09/16/12 224 lb (101.606 kg)      Patient Active Problem List   Diagnosis Date Noted  . Obesity, unspecified 11/23/2013  . Family history of colon cancer requiring screening colonoscopy 11/23/2013  . Hyperglycemia 07/30/2011  . Routine general medical examination at a health care facility 04/18/2011  . HYPERLIPIDEMIA 10/27/2009  . HYPERTENSION 10/27/2009  . FATIGUE 10/27/2009  . URINARY INCONTINENCE  10/27/2009   Past Medical History  Diagnosis Date  . Hyperlipidemia   . Hypertension   . Urinary incontinence    Past Surgical History  Procedure Laterality Date  . Appendectomy    . Tonsillectomy     History  Substance Use Topics  . Smoking status: Former Research scientist (life sciences)  . Smokeless tobacco: Not on file  . Alcohol Use: Yes   Family History  Problem Relation Age of Onset  . Cancer Mother     colon  . Heart disease Father     s/p stents in the 60's  . Hypertension Sister    Allergies  Allergen Reactions  . Niacin     REACTION: flushing, SOB face numb   No current outpatient prescriptions on file prior to visit.   No current facility-administered medications on file prior to visit.   The PMH, PSH, Social History, Family History, Medications, and allergies have been reviewed in [facility], and have been updated if relevant.  Review of Systems       See HPI Patient reports no  vision/ hearing changes,anorexia, weight change, fever ,adenopathy, persistant / recurrent hoarseness, swallowing issues, chest pain, edema,persistant / recurrent cough, hemoptysis, dyspnea(rest, exertional, paroxysmal nocturnal), gastrointestinal  bleeding (melena, rectal bleeding), abdominal pain, excessive heart burn, GU symptoms(dysuria, hematuria, pyuria, voiding/incontinence  Issues) syncope, focal weakness, severe memory loss, concerning skin lesions, depression, anxiety, abnormal bruising/bleeding, major joint  swelling, breast masses or abnormal vaginal bleeding.     Physical Exam BP 126/74  Pulse 73  Temp(Src) 98.1 F (36.7 C) (Oral)  Ht 5' 9.5" (1.765 m)  Wt 246 lb 8 oz (111.812 kg)  BMI 35.89 kg/m2  SpO2 95% Wt Readings from Last 3 Encounters:  11/23/13 246 lb 8 oz (111.812 kg)  09/02/13 246 lb 8 oz (111.812 kg)  09/16/12 224 lb (101.606 kg)     General:  alert and overweight-appearing.   Head:  normocephalic and atraumatic.   Eyes:  vision grossly intact, pupils equal, and pupils round.    Ears:  R ear normal and L ear normal.   Nose:  no external deformity.   Mouth:  good dentition.   Lungs:  Normal respiratory effort, chest expands symmetrically. Lungs are clear to auscultation, no crackles or wheezes. Heart:  Normal rate and regular rhythm. S1 and S2 normal without gallop, murmur, click, rub or other extra sounds. Abdomen:  Bowel sounds positive,abdomen soft and non-tender without masses, organomegaly or hernias noted. Msk:  No deformity or scoliosis noted of thoracic or lumbar spine.   Extremities:  no edema Neurologic:  alert & oriented X3.   Skin:  Intact without suspicious lesions or rashes Psych:  Oriented X3, memory intact for recent and remote, normally interactive, good eye contact, not anxious appearing, and not depressed appearing.    Assessment and Plan:

## 2013-11-23 NOTE — Assessment & Plan Note (Signed)
Refer to GI for colonoscopy.

## 2013-11-23 NOTE — Assessment & Plan Note (Signed)
Improved but not at goal for diabetic. She is refusing statin.

## 2013-11-23 NOTE — Progress Notes (Signed)
Pre visit review using our clinic review tool, if applicable. No additional management support is needed unless otherwise documented below in the visit note. 

## 2013-11-23 NOTE — Assessment & Plan Note (Signed)
Stable on current rx. No changes. 

## 2013-11-24 ENCOUNTER — Other Ambulatory Visit: Payer: Self-pay | Admitting: Family Medicine

## 2013-11-24 ENCOUNTER — Encounter: Payer: Self-pay | Admitting: Family Medicine

## 2013-11-24 ENCOUNTER — Telehealth: Payer: Self-pay | Admitting: Family Medicine

## 2013-11-24 DIAGNOSIS — R7303 Prediabetes: Secondary | ICD-10-CM

## 2013-11-24 LAB — BASIC METABOLIC PANEL
BUN: 19 mg/dL (ref 6–23)
CO2: 33 mEq/L — ABNORMAL HIGH (ref 19–32)
Calcium: 10 mg/dL (ref 8.4–10.5)
Chloride: 100 mEq/L (ref 96–112)
Creatinine, Ser: 0.6 mg/dL (ref 0.4–1.2)
GFR: 106.33 mL/min (ref >60.00)
Glucose, Bld: 82 mg/dL (ref 70–99)
Potassium: 3.7 mEq/L (ref 3.5–5.1)
Sodium: 140 mEq/L (ref 135–145)

## 2013-11-24 LAB — MICROALBUMIN / CREATININE URINE RATIO
Creatinine,U: 90.6 mg/dL
Microalb Creat Ratio: 0.4 mg/g (ref 0.0–30.0)
Microalb, Ur: 0.4 mg/dL (ref 0.0–1.9)

## 2013-11-24 LAB — HEMOGLOBIN A1C: Hgb A1c MFr Bld: 6.3 % (ref 4.6–6.5)

## 2013-11-24 NOTE — Telephone Encounter (Signed)
Relevant patient education assigned to patient using Emmi. ° °

## 2013-12-28 ENCOUNTER — Encounter: Payer: Self-pay | Admitting: Family Medicine

## 2014-02-05 ENCOUNTER — Ambulatory Visit (INDEPENDENT_AMBULATORY_CARE_PROVIDER_SITE_OTHER): Payer: 59 | Admitting: Family Medicine

## 2014-02-05 ENCOUNTER — Encounter: Payer: Self-pay | Admitting: Family Medicine

## 2014-02-05 VITALS — BP 140/88 | HR 81 | Temp 98.3°F | Wt 244.0 lb

## 2014-02-05 DIAGNOSIS — N390 Urinary tract infection, site not specified: Secondary | ICD-10-CM | POA: Insufficient documentation

## 2014-02-05 DIAGNOSIS — R300 Dysuria: Secondary | ICD-10-CM

## 2014-02-05 DIAGNOSIS — N3001 Acute cystitis with hematuria: Secondary | ICD-10-CM

## 2014-02-05 DIAGNOSIS — N3000 Acute cystitis without hematuria: Secondary | ICD-10-CM

## 2014-02-05 LAB — POCT URINALYSIS DIPSTICK
Bilirubin, UA: NEGATIVE
Glucose, UA: NEGATIVE
Ketones, UA: NEGATIVE
Nitrite, UA: NEGATIVE
Spec Grav, UA: 1.02
Urobilinogen, UA: NEGATIVE
pH, UA: 6

## 2014-02-05 MED ORDER — CIPROFLOXACIN HCL 250 MG PO TABS: 250.0000 mg | ORAL_TABLET | Freq: Two times a day (BID) | ORAL | Status: DC

## 2014-02-05 NOTE — Patient Instructions (Signed)
Drink plenty of water and start the antibiotics today.  We'll contact you with your lab report.  Take care.   

## 2014-02-05 NOTE — Progress Notes (Signed)
Pre visit review using our clinic review tool, if applicable. No additional management support is needed unless otherwise documented below in the visit note.  Dysuria: yes, frequency, pain/pressure with/after urination duration of symptoms: 2 days abdominal pain: suprapubic area sore, intermittently fevers:no back pain:no vomiting:no  Meds, vitals, and allergies reviewed.   ROS: See HPI.  Otherwise negative.    GEN: nad, alert and oriented HEENT: mucous membranes moist NECK: supple CV: rrr.  PULM: ctab, no inc wob ABD: soft, +bs, suprapubic area not tender EXT: no edema SKIN: no acute rash BACK: no CVA pain  U/a d/w pt.  ucx pending.

## 2014-02-05 NOTE — Assessment & Plan Note (Signed)
Nontoxic, cipro, fluids, ucx, f/u prn. D/w pt.

## 2014-02-08 ENCOUNTER — Other Ambulatory Visit: Payer: Self-pay | Admitting: Family Medicine

## 2014-02-08 LAB — URINE CULTURE: Colony Count: >=100000

## 2014-02-08 MED ORDER — CEPHALEXIN 250 MG PO CAPS: 250.0000 mg | ORAL_CAPSULE | Freq: Two times a day (BID) | ORAL | Status: DC

## 2014-02-18 ENCOUNTER — Other Ambulatory Visit: Payer: Self-pay | Admitting: Obstetrics and Gynecology

## 2014-02-19 LAB — CYTOLOGY - PAP

## 2014-02-24 ENCOUNTER — Encounter: Payer: Self-pay | Admitting: Internal Medicine

## 2014-03-02 ENCOUNTER — Encounter: Payer: Self-pay | Admitting: Family Medicine

## 2014-03-02 ENCOUNTER — Ambulatory Visit (INDEPENDENT_AMBULATORY_CARE_PROVIDER_SITE_OTHER): Payer: 59 | Admitting: Family Medicine

## 2014-03-02 VITALS — BP 126/72 | HR 65 | Temp 98.2°F | Wt 242.2 lb

## 2014-03-02 DIAGNOSIS — K5900 Constipation, unspecified: Secondary | ICD-10-CM | POA: Insufficient documentation

## 2014-03-02 DIAGNOSIS — R7309 Other abnormal glucose: Secondary | ICD-10-CM

## 2014-03-02 DIAGNOSIS — I10 Essential (primary) hypertension: Secondary | ICD-10-CM

## 2014-03-02 DIAGNOSIS — Z23 Encounter for immunization: Secondary | ICD-10-CM

## 2014-03-02 DIAGNOSIS — R739 Hyperglycemia, unspecified: Secondary | ICD-10-CM

## 2014-03-02 LAB — COMPREHENSIVE METABOLIC PANEL
ALT: 32 U/L (ref 0–35)
AST: 29 U/L (ref 0–37)
Albumin: 4 g/dL (ref 3.5–5.2)
Alkaline Phosphatase: 53 U/L (ref 39–117)
BUN: 15 mg/dL (ref 6–23)
CO2: 33 mEq/L — ABNORMAL HIGH (ref 19–32)
Calcium: 9.6 mg/dL (ref 8.4–10.5)
Chloride: 100 mEq/L (ref 96–112)
Creatinine, Ser: 0.7 mg/dL (ref 0.4–1.2)
GFR: 93.88 mL/min (ref >60.00)
Glucose, Bld: 132 mg/dL — ABNORMAL HIGH (ref 70–99)
Potassium: 3.2 mEq/L — ABNORMAL LOW (ref 3.5–5.1)
Sodium: 139 mEq/L (ref 135–145)
Total Bilirubin: 0.8 mg/dL (ref 0.2–1.2)
Total Protein: 7.1 g/dL (ref 6.0–8.3)

## 2014-03-02 LAB — HEMOGLOBIN A1C: Hgb A1c MFr Bld: 6.2 % (ref 4.6–6.5)

## 2014-03-02 NOTE — Assessment & Plan Note (Signed)
Well controlled on current rx. No changes made. 

## 2014-03-02 NOTE — Addendum Note (Signed)
Addended by: Modena Nunnery on: 03/02/2014 09:26 AM   Modules accepted: Orders

## 2014-03-02 NOTE — Progress Notes (Signed)
Pre visit review using our clinic review tool, if applicable. No additional management support is needed unless otherwise documented below in the visit note. 

## 2014-03-02 NOTE — Progress Notes (Signed)
Subjective:   Patient ID: Hannah Giles, female    DOB: [birth year], 53 y.o.   MRN: [id number]  Hannah Giles is a pleasant 53 y.o. year old female who presents to clinic today with Follow-up  on 03/02/2014  HPI: Here for 3 month follow up.  1.  Hyperglyemia-  Glucose elevated at CPX in 11/2013.   Lab Results  Component Value Date   HGBA1C 6.3 11/23/2013   She wanted to work on diet and follow up labs in 3 months.  Admits to not being that compliant with diet.  Insurance company would not pay for her to see a nutritionist although I did refer her.  2.  Constipation- seems to be worse after she takes abx or when she travels. Fiber not helping.  Wants to discuss options.  3.  Recurrent UTIs- has had two this year, most recently was seen by Dr. Damita Dunnings on 9/4- note reviewed.  Denies any current dysuria, back pain, suprapubic tenderness, nausea, vomiting or fever.  Current Outpatient Prescriptions on File Prior to Visit  Medication Sig Dispense Refill  . atenolol-chlorthalidone (TENORETIC) 50-25 MG per tablet TAKE 1 TABLET BY MOUTH DAILY.  30 tablet  2  . cephALEXin (KEFLEX) 250 MG capsule Take 1 capsule (250 mg total) by mouth 2 (two) times daily.  14 capsule  0  . Choline Fenofibrate (FENOFIBRIC ACID) 135 MG CPDR TAKE 1 CAPSULE (135 MG TOTAL) BY MOUTH DAILY.  30 capsule  2  . FIBER FORMULA CAPS Take 1 capsule by mouth daily.      . Probiotic Product (PROBIOTIC DAILY PO) Take 1 capsule by mouth daily.       No current facility-administered medications on file prior to visit.    Allergies  Allergen Reactions  . Niacin     REACTION: flushing, SOB face numb    Past Medical History  Diagnosis Date  . Hyperlipidemia   . Hypertension   . Urinary incontinence     Past Surgical History  Procedure Laterality Date  . Appendectomy    . Tonsillectomy      Family History  Problem Relation Age of Onset  . Cancer Mother     colon  . Heart disease Father     s/p stents in the 60's    . Hypertension Sister     History   Social History  . Marital Status: Married    Spouse Name: N/A    Number of Children: N/A  . Years of Education: N/A   Occupational History  . 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 Smokeless tobacco: Not on file  . Alcohol Use: Yes  . Drug Use: Not on file  . Sexual Activity: Not on file   Other Topics Concern  . Not on file   Social History Narrative  . No narrative on file   The PMH, PSH, Social History, Family History, Medications, and allergies have been reviewed in [facility], and have been updated if relevant.   Review of Systems See HPI No HA No increased thirst No increased urination No dysuria No n/v/d    Objective:    BP 126/72  Pulse 65  Temp(Src) 98.2 F (36.8 C) (Oral)  Wt 242 lb 4 oz (109.884 kg)  SpO2 96%   Physical Exam  Nursing note and vitals reviewed. Constitutional: She appears well-developed and well-nourished. No distress.  HENT:  Head: Normocephalic.  Cardiovascular: Normal rate, regular rhythm and  normal heart sounds.   Pulmonary/Chest: Effort normal and breath sounds normal.  Abdominal: Soft. Bowel sounds are normal. She exhibits no distension. There is no tenderness. There is no rebound and no guarding.  Skin: Skin is warm and dry.  Psychiatric: She has a normal mood and affect. Her behavior is normal. Judgment and thought content normal.          Assessment & Plan:   Hyperglycemia - Plan: Hemoglobin A1c, Comprehensive metabolic panel  Unspecified constipation No Follow-up on file.

## 2014-03-02 NOTE — Assessment & Plan Note (Signed)
Recheck labs today. Will likely need to add rx like metformin. The patient indicates understanding of these issues and agrees with the plan.

## 2014-03-02 NOTE — Assessment & Plan Note (Signed)
Deteriorated. Discussed tx options- increased water intake and exercise. I suggested trying osmotic laxative next like miralax. I also suggested probiotics, especially during abx use but she states this has been ineffective for her.

## 2014-03-16 ENCOUNTER — Other Ambulatory Visit: Payer: Self-pay | Admitting: *Deleted

## 2014-03-16 MED ORDER — FENOFIBRIC ACID 135 MG PO CPDR: DELAYED_RELEASE_CAPSULE | ORAL | Status: DC

## 2014-03-16 MED ORDER — ATENOLOL-CHLORTHALIDONE 50-25 MG PO TABS: ORAL_TABLET | ORAL | Status: DC

## 2014-03-16 NOTE — Addendum Note (Signed)
Addended by: Modena Nunnery on: 03/16/2014 04:02 PM   Modules accepted: Orders

## 2014-03-29 ENCOUNTER — Telehealth: Payer: Self-pay | Admitting: *Deleted

## 2014-03-29 NOTE — Telephone Encounter (Signed)
Dr Hilarie Fredrickson, This pt has a colon scheduled with you on 11-20 for family hx colon cancer in her mother. Her last colon was 10-30-2001 with Dr Collene Mares. At her last physical, she requested to change Md as she was not please with her experience with Dr Collene Mares. His findings were poor prep and hems. Recall was 5 years and I cannot find another colon. I just wanted you to look at her records and make sure it's ok to proceed with a colon with that. I will also put in a 2 day prep for her as she had large amount of stool with this last colon despite mag citrate and a gallon of gloytely.  Please advise  Thanks  Lelan Pons PV

## 2014-03-29 NOTE — Telephone Encounter (Signed)
With family history of colon cancer would recommend colonoscopy every 5 years. Last colonoscopy 2003 sp she is due now. Agree with 2 day prep

## 2014-03-31 NOTE — Telephone Encounter (Signed)
Proceed with colon. 2 day prep as ok per Dr Hilarie Fredrickson. ewm rn

## 2014-04-01 ENCOUNTER — Ambulatory Visit (AMBULATORY_SURGERY_CENTER): Payer: Self-pay | Admitting: *Deleted

## 2014-04-01 VITALS — Ht 70.0 in | Wt 245.4 lb

## 2014-04-01 DIAGNOSIS — Z800 Family history of malignant neoplasm of digestive organs: Secondary | ICD-10-CM

## 2014-04-01 MED ORDER — MOVIPREP 100 G PO SOLR: 1.0000 | Freq: Once | ORAL | Status: DC

## 2014-04-01 NOTE — Progress Notes (Signed)
No egg or soy allergy. ewm No issues with past sedation. ewm No diet pills.ewm No home 02 use. ewm

## 2014-04-13 ENCOUNTER — Encounter: Payer: Self-pay | Admitting: Internal Medicine

## 2014-04-23 ENCOUNTER — Ambulatory Visit (AMBULATORY_SURGERY_CENTER): Payer: 59 | Admitting: Internal Medicine

## 2014-04-23 ENCOUNTER — Encounter: Payer: Self-pay | Admitting: Internal Medicine

## 2014-04-23 VITALS — BP 140/61 | HR 66 | Temp 98.7°F | Resp 24 | Ht 70.0 in | Wt 245.0 lb

## 2014-04-23 DIAGNOSIS — D122 Benign neoplasm of ascending colon: Secondary | ICD-10-CM

## 2014-04-23 DIAGNOSIS — D123 Benign neoplasm of transverse colon: Secondary | ICD-10-CM

## 2014-04-23 DIAGNOSIS — D128 Benign neoplasm of rectum: Secondary | ICD-10-CM

## 2014-04-23 DIAGNOSIS — Z800 Family history of malignant neoplasm of digestive organs: Secondary | ICD-10-CM

## 2014-04-23 DIAGNOSIS — D125 Benign neoplasm of sigmoid colon: Secondary | ICD-10-CM

## 2014-04-23 DIAGNOSIS — Z1211 Encounter for screening for malignant neoplasm of colon: Secondary | ICD-10-CM

## 2014-04-23 MED ORDER — SODIUM CHLORIDE 0.9 % IV SOLN: 500.0000 mL | INTRAVENOUS | Status: DC

## 2014-04-23 NOTE — Progress Notes (Signed)
Called to room to assist during endoscopic procedure.  Patient ID and intended procedure confirmed with present staff. Received instructions for my participation in the procedure from the performing physician.  

## 2014-04-23 NOTE — Progress Notes (Signed)
Procedure ends, to recovery, report given and VSS. 

## 2014-04-23 NOTE — Patient Instructions (Signed)
FOLLOW DISCHARGE INSTRUCTIONS (BLUE AND GREEN SHEETS).YOU HAD AN ENDOSCOPIC PROCEDURE TODAY AT [facility]: Refer to the procedure report that was given to you for any specific questions about what was found during the examination.  If the procedure report does not answer your questions, please call your gastroenterologist to clarify.  If you requested that your care partner not be given the details of your procedure findings, then the procedure report has been included in a sealed envelope for you to review at your convenience later.  YOU SHOULD EXPECT: Some feelings of bloating in the abdomen. Passage of more gas than usual.  Walking can help get rid of the air that was put into your GI tract during the procedure and reduce the bloating. If you had a lower endoscopy (such as a colonoscopy or flexible sigmoidoscopy) you may notice spotting of blood in your stool or on the toilet paper. If you underwent a bowel prep for your procedure, then you may not have a normal bowel movement for a few days.  DIET: Your first meal following the procedure should be a light meal and then it is ok to progress to your normal diet.  A half-sandwich or bowl of soup is an example of a good first meal.  Heavy or fried foods are harder to digest and may make you feel nauseous or bloated.  Likewise meals heavy in dairy and vegetables can cause extra gas to form and this can also increase the bloating.  Drink plenty of fluids but you should avoid alcoholic beverages for 24 hours.  ACTIVITY: Your care partner should take you home directly after the procedure.  You should plan to take it easy, moving slowly for the rest of the day.  You can resume normal activity the day after the procedure however you should NOT DRIVE or use heavy machinery for 24 hours (because of the sedation medicines used during the test).    SYMPTOMS TO REPORT IMMEDIATELY: A gastroenterologist can be reached at any hour.  During normal  business hours, 8:30 AM to 5:00 PM Monday through Friday, call [phone].  After hours and on weekends, please call the GI answering service at [phone] who will take a message and have the physician on call contact you.   Following lower endoscopy (colonoscopy or flexible sigmoidoscopy):  Excessive amounts of blood in the stool  Significant tenderness or worsening of abdominal pains  Swelling of the abdomen that is new, acute  Fever of 100F or higher  FOLLOW UP: If any biopsies were taken you will be contacted by phone or by letter within the next 1-3 weeks.  Call your gastroenterologist if you have not heard about the biopsies in 3 weeks.  Our staff will call the home number listed on your records the next business day following your procedure to check on you and address any questions or concerns that you may have at that time regarding the information given to you following your procedure. This is a courtesy call and so if there is no answer at the home number and we have not heard from you through the emergency physician on call, we will assume that you have returned to your regular daily activities without incident.  SIGNATURES/CONFIDENTIALITY: You and/or your care partner have signed paperwork which will be entered into your electronic medical record.  These signatures attest to the fact that that the information above on your After Visit Summary has been reviewed and is understood.  Full  responsibility of the confidentiality of this discharge information lies with you and/or your care-partner.  Polyp information given.

## 2014-04-23 NOTE — Op Note (Signed)
[facility] [place]  [address]. [place], [address]   COLONOSCOPY PROCEDURE REPORT  PATIENT: Hannah Giles, Hannah Giles  MR#: [id number] BIRTHDATE: 01/17/1961 , 75  yrs. old GENDER: female ENDOSCOPIST: Jerene Bears, MD REFERRED NU:UVOZD Aron, M.D. PROCEDURE DATE:  04/23/2014 PROCEDURE:   Colonoscopy with cold biopsy polypectomy First Screening Colonoscopy - Avg.  risk and is 50 yrs.  old or older - No.  Prior Negative Screening - Now for repeat screening. N/A  History of Adenoma - Now for follow-up colonoscopy & has been > or = to 3 yrs.  N/A  Polyps Removed Today? Yes. ASA CLASS:   Class II INDICATIONS:patient's immediate family history of colon cancer. MEDICATIONS: Monitored anesthesia care and Propofol 400 mg IV  DESCRIPTION OF PROCEDURE:   After the risks benefits and alternatives of the procedure were thoroughly explained, informed consent was obtained.  The digital rectal exam revealed no rectal mass.   The LB [id number]-[id number]  endoscope was introduced through the anus and advanced to the cecum, which was identified by both the appendix and ileocecal valve. No adverse events experienced. The quality of the prep was good, using MoviPrep  The instrument was then slowly withdrawn as the colon was fully examined.   COLON FINDINGS: Three sessile polyps ranging between 3-31mm in size were found in the sigmoid colon, transverse colon, and ascending colon.  Polypectomies were performed with cold forceps.  The resection was complete, the polyp tissue was completely retrieved and sent to histology.   Two polyps ranging between 3-77mm in size were found in the rectum.  Polypectomies were performed with cold forceps.  The resection was complete, the polyp tissue was completely retrieved and sent to histology.  Retroflexed views revealed no abnormalities. The time to cecum=3 minutes 58 seconds. Withdrawal time=12 minutes 27 seconds.  The scope was withdrawn and the procedure  completed. COMPLICATIONS: There were no immediate complications.  ENDOSCOPIC IMPRESSION: 1.   Three sessile polyps ranging between 3-26mm in size were found in the sigmoid colon, transverse colon, and ascending colon; polypectomies were performed with cold forceps 2.   Two polyps ranging between 3-75mm in size were found in the rectum; polypectomies were performed with cold forceps  RECOMMENDATIONS: 1.  Await pathology results 2.  Timing of repeat colonoscopy will be determined by pathology findings. 3.  You will receive a letter within 1-2 weeks with the results of your biopsy as well as final recommendations.  Please call my office if you have not received a letter after 3 weeks.  eSigned:  Jerene Bears, MD 04/23/2014 3:09 PM  cc: Arnette Norris MD and The Patient

## 2014-04-26 ENCOUNTER — Telehealth: Payer: Self-pay | Admitting: *Deleted

## 2014-04-26 NOTE — Telephone Encounter (Signed)
  Follow up Call-  Call back number 04/23/2014  Post procedure Call Back phone  # [phone]  Permission to leave phone message Yes     Patient questions:  Do you have a fever, pain , or abdominal swelling? No. Pain Score  0 *  Have you tolerated food without any problems? Yes.    Have you been able to return to your normal activities? Yes.    Do you have any questions about your discharge instructions: Diet   No. Medications  No. Follow up visit  No.  Do you have questions or concerns about your Care? No.  Actions: * If pain score is 4 or above: No action needed, pain <4.

## 2014-05-03 ENCOUNTER — Encounter: Payer: Self-pay | Admitting: Internal Medicine

## 2014-06-11 ENCOUNTER — Other Ambulatory Visit: Payer: Self-pay | Admitting: Family Medicine

## 2014-09-16 ENCOUNTER — Other Ambulatory Visit: Payer: Self-pay | Admitting: Family Medicine

## 2014-09-21 ENCOUNTER — Encounter: Payer: Self-pay | Admitting: Family Medicine

## 2014-09-21 ENCOUNTER — Other Ambulatory Visit: Payer: Self-pay | Admitting: Family Medicine

## 2014-10-14 ENCOUNTER — Encounter: Payer: Self-pay | Admitting: Family Medicine

## 2014-10-14 ENCOUNTER — Ambulatory Visit (INDEPENDENT_AMBULATORY_CARE_PROVIDER_SITE_OTHER): Payer: 59 | Admitting: Family Medicine

## 2014-10-14 VITALS — BP 132/64 | HR 71 | Temp 98.1°F | Wt 249.5 lb

## 2014-10-14 DIAGNOSIS — R5383 Other fatigue: Secondary | ICD-10-CM

## 2014-10-14 DIAGNOSIS — E669 Obesity, unspecified: Secondary | ICD-10-CM

## 2014-10-14 DIAGNOSIS — F4323 Adjustment disorder with mixed anxiety and depressed mood: Secondary | ICD-10-CM

## 2014-10-14 DIAGNOSIS — R739 Hyperglycemia, unspecified: Secondary | ICD-10-CM

## 2014-10-14 DIAGNOSIS — E785 Hyperlipidemia, unspecified: Secondary | ICD-10-CM

## 2014-10-14 DIAGNOSIS — I10 Essential (primary) hypertension: Secondary | ICD-10-CM | POA: Diagnosis not present

## 2014-10-14 LAB — HEMOGLOBIN A1C: Hgb A1c MFr Bld: 7.2 % — ABNORMAL HIGH (ref 4.6–6.5)

## 2014-10-14 LAB — VITAMIN B12: Vitamin B-12: 256 pg/mL (ref 211–911)

## 2014-10-14 LAB — VITAMIN D 25 HYDROXY (VIT D DEFICIENCY, FRACTURES): VITD: 10.51 ng/mL — ABNORMAL LOW (ref 30.00–100.00)

## 2014-10-14 MED ORDER — FENOFIBRIC ACID 135 MG PO CPDR: DELAYED_RELEASE_CAPSULE | ORAL | Status: DC

## 2014-10-14 MED ORDER — BUPROPION HCL ER (XL) 150 MG PO TB24: 150.0000 mg | ORAL_TABLET | Freq: Every day | ORAL | Status: DC

## 2014-10-14 MED ORDER — ATENOLOL-CHLORTHALIDONE 50-25 MG PO TABS: 1.0000 | ORAL_TABLET | Freq: Every day | ORAL | Status: DC

## 2014-10-14 NOTE — Assessment & Plan Note (Signed)
New- >25 minutes spent in face to face time with patient, >50% spent in counselling or coordination of care Declining psychotherapy at this time but does want to try pharmacotherapy.  After discussing rx options along with side effects, we agreed on Wellbutrin as initial rx choice. eRx sent for Wellbutrin 150 mg XL daily. She will call me in 3 weeks with an update.

## 2014-10-14 NOTE — Assessment & Plan Note (Signed)
Fatigue.  Likely multifactorial but depression is playing a large roll.  Check labs today.  See below for tx of depressive symptoms.

## 2014-10-14 NOTE — Progress Notes (Signed)
Pre visit review using our clinic review tool, if applicable. No additional management support is needed unless otherwise documented below in the visit note. 

## 2014-10-14 NOTE — Progress Notes (Signed)
Subjective:   Patient ID: Hannah Giles, female    DOB: 05-23-1961, 54 y.o.   MRN: [id number]  Hannah Giles is a pleasant 54 y.o. year old female who presents to clinic today with Follow-up and Medication Refill  on 10/14/2014  HPI: Here for follow up of chronic medical conditions.  H/o hypoglycemia- admits to not eating well lately. Under a lot of stress--she is a stress eater.  Has also been gaining weight.    Lab Results  Component Value Date   HGBA1C 6.2 03/02/2014   Has not been working on exercise or diet.  Weight watchers has worked well in the past for her.  Wt Readings from Last 3 Encounters:  10/14/14 249 lb 8 oz (113.172 kg)  04/23/14 245 lb (111.131 kg)  04/01/14 245 lb 6.4 oz (111.313 kg)   Has been more tired lately.  Also admits to being more tearful and not wanting to get out of bed in the morning.  Job is very stressful, having financial and home stressors as well.  Sleeping "too well," wants to sleep all day.  Denies SI or HI.  Denies any panic attacks.  No current outpatient prescriptions on file prior to visit.   No current facility-administered medications on file prior to visit.    Allergies  Allergen Reactions  . Niacin     REACTION: flushing, SOB face numb    Past Medical History  Diagnosis Date  . Hyperlipidemia   . Hypertension   . Urinary incontinence     Past Surgical History  Procedure Laterality Date  . Appendectomy    . Tonsillectomy    . Colonoscopy    . Back surgery    . Temporomandibular joint surgery    . Removal right ovary and tube      Family History  Problem Relation Age of Onset  . Cancer Mother     colon  . Colon cancer Mother   . Heart disease Father     s/p stents in the 60's  . Dementia Father   . Hypertension Sister   . Pancreatic cancer Paternal Grandfather   . Rectal cancer Neg Hx   . Stomach cancer Neg Hx     History   Social History  . Marital Status: Married    Spouse Name: N/A  . Number of  Children: N/A  . Years of Education: N/A   Occupational History  . 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 Smokeless tobacco: Never Used  . Alcohol Use: Yes     Comment: socially  . Drug Use: No  . Sexual Activity: Not on file   Other Topics Concern  . Not on file   Social History Narrative   The PMH, PSH, Social History, Family History, Medications, and allergies have been reviewed in [facility], and have been updated if relevant.   Review of Systems  Constitutional: Positive for fatigue.  HENT: Negative.   Respiratory: Negative.   Cardiovascular: Negative.   Psychiatric/Behavioral: Positive for sleep disturbance and dysphoric mood. Negative for suicidal ideas, behavioral problems, confusion, self-injury and agitation.  All other systems reviewed and are negative.   Objective:    BP 132/64 mmHg  Pulse 71  Temp(Src) 98.1 F (36.7 C) (Oral)  Wt 249 lb 8 oz (113.172 kg)  SpO2 95%   Physical Exam  Constitutional: She appears well-developed and well-nourished. No distress.  HENT:  Head: Normocephalic.  Cardiovascular: Normal rate,  regular rhythm and normal heart sounds.   Pulmonary/Chest: Effort normal and breath sounds normal.  Abdominal: Soft. Bowel sounds are normal. She exhibits no distension. There is no tenderness. There is no rebound and no guarding.  Skin: Skin is warm and dry.  Psychiatric: She has a normal mood and affect. Her behavior is normal. Judgment and thought content normal.  Nursing note and vitals reviewed.         Assessment & Plan:   Adjustment disorder with mixed anxiety and depressed mood  Essential hypertension  HLD (hyperlipidemia) - Plan: Lipid panel  Obesity  Other fatigue - Plan: CBC with Differential/Platelet, Comprehensive metabolic panel, TSH, Hemoglobin A1c, Vitamin B12, Vitamin D, 25-hydroxy  Hyperglycemia No Follow-up on file.

## 2014-10-14 NOTE — Patient Instructions (Signed)
Great to see you. Hang in there. We will call you with your lab results.  Please call me in a few weeks with how wellbutrin is working for you.

## 2014-10-14 NOTE — Assessment & Plan Note (Signed)
Deteriorated.  She admits that she is not motivated to make proper life style changes for weight loss.  Hopefully treating her depression will help jump start her motivation.  Insurance will not pay for nutritionist- I have referred her in past.

## 2014-10-27 ENCOUNTER — Ambulatory Visit (INDEPENDENT_AMBULATORY_CARE_PROVIDER_SITE_OTHER): Payer: 59 | Admitting: Family Medicine

## 2014-10-27 ENCOUNTER — Encounter: Payer: Self-pay | Admitting: Family Medicine

## 2014-10-27 VITALS — BP 114/68 | HR 78 | Temp 98.2°F | Wt 247.0 lb

## 2014-10-27 DIAGNOSIS — E119 Type 2 diabetes mellitus without complications: Secondary | ICD-10-CM | POA: Insufficient documentation

## 2014-10-27 DIAGNOSIS — E559 Vitamin D deficiency, unspecified: Secondary | ICD-10-CM | POA: Diagnosis not present

## 2014-10-27 MED ORDER — VITAMIN D (ERGOCALCIFEROL) 1.25 MG (50000 UNIT) PO CAPS: 50000.0000 [IU] | ORAL_CAPSULE | ORAL | Status: DC

## 2014-10-27 MED ORDER — METFORMIN HCL 500 MG PO TABS: 500.0000 mg | ORAL_TABLET | Freq: Every day | ORAL | Status: DC

## 2014-10-27 NOTE — Progress Notes (Signed)
Pre visit review using our clinic review tool, if applicable. No additional management support is needed unless otherwise documented below in the visit note. 

## 2014-10-27 NOTE — Progress Notes (Signed)
Subjective:   Patient ID: Hannah Giles, female    DOB: 11-Apr-1961, 54 y.o.   MRN: [id number]  Hannah Giles is a pleasant 54 y.o. year old female who presents to clinic today with Follow-up  on 10/27/2014  HPI:  New onset diabetes- Lab Results  Component Value Date   HGBA1C 7.2* 10/14/2014   Has been eating more sweets lately.  Noticed some dry mouth, increased thirst.  Knows that she needs to lose weight- working on this and has already lost 3 pounds. Wt Readings from Last 3 Encounters:  10/27/14 247 lb (112.038 kg)  10/14/14 249 lb 8 oz (113.172 kg)  04/23/14 245 lb (111.131 kg)   Lab Results  Component Value Date   CHOL 202* 11/16/2013   HDL 42.30 11/16/2013   LDLCALC 123* 11/16/2013   LDLDIRECT 156.8 09/16/2012   TRIG 185.0* 11/16/2013   CHOLHDL 5 11/16/2013    Vit D deficiency- she did not realize that rx for high dose vit D was sent in. Wellbutrin has helped some with her fatigue already.  Current Outpatient Prescriptions on File Prior to Visit  Medication Sig Dispense Refill  . atenolol-chlorthalidone (TENORETIC) 50-25 MG per tablet Take 1 tablet by mouth daily. 30 tablet 4  . buPROPion (WELLBUTRIN XL) 150 MG 24 hr tablet Take 1 tablet (150 mg total) by mouth daily. 30 tablet 3  . Choline Fenofibrate (FENOFIBRIC ACID) 135 MG CPDR TAKE 1 CAPSULE (135 MG TOTAL) BY MOUTH DAILY. 30 capsule 0  . polyethylene glycol (MIRALAX / GLYCOLAX) packet Take 17 g by mouth daily.     No current facility-administered medications on file prior to visit.    Allergies  Allergen Reactions  . Niacin     REACTION: flushing, SOB face numb    Past Medical History  Diagnosis Date  . Hyperlipidemia   . Hypertension   . Urinary incontinence     Past Surgical History  Procedure Laterality Date  . Appendectomy    . Tonsillectomy    . Colonoscopy    . Back surgery    . Temporomandibular joint surgery    . Removal right ovary and tube      Family History  Problem Relation  Age of Onset  . Cancer Mother     colon  . Colon cancer Mother   . Heart disease Father     s/p stents in the 60's  . Dementia Father   . Hypertension Sister   . Pancreatic cancer Paternal Grandfather   . Rectal cancer Neg Hx   . Stomach cancer Neg Hx     History   Social History  . Marital Status: Married    Spouse Name: N/A  . Number of Children: N/A  . Years of Education: N/A   Occupational History  . 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 Smokeless tobacco: Never Used  . Alcohol Use: Yes     Comment: socially  . Drug Use: No  . Sexual Activity: Not on file   Other Topics Concern  . Not on file   Social History Narrative  The PMH, PSH, Social History, Family History, Medications, and allergies have been reviewed in [facility], and have been updated if relevant.   Review of Systems  Constitutional: Positive for fatigue.  HENT: Negative.   Eyes: Negative.   Respiratory: Negative.   Cardiovascular: Negative.   Endocrine: Positive for polydipsia and polyuria.  Musculoskeletal: Negative.  Skin: Negative.   Hematological: Negative.   Psychiatric/Behavioral: Negative.   All other systems reviewed and are negative.      Objective:    BP 114/68 mmHg  Pulse 78  Temp(Src) 98.2 F (36.8 C) (Oral)  Wt 247 lb (112.038 kg)  SpO2 98%   Physical Exam  Constitutional: She is oriented to person, place, and time. She appears well-developed and well-nourished. No distress.  HENT:  Head: Normocephalic.  Eyes: Conjunctivae are normal.  Cardiovascular: Normal rate.   Pulmonary/Chest: Effort normal.  Musculoskeletal: She exhibits no edema.  Neurological: She is alert and oriented to person, place, and time. No cranial nerve deficit.  Skin: Skin is warm and dry.  Psychiatric: She has a normal mood and affect. Her behavior is normal. Judgment and thought content normal.  Nursing note and vitals reviewed.           Assessment & Plan:   Diabetes mellitus, new onset No Follow-up on file.

## 2014-10-27 NOTE — Assessment & Plan Note (Signed)
New- >15 minutes spent in face to face time with patient, >50% spent in counselling or coordination of care. Metformin 500 mg daily with breakfast. Refer to diabetic teaching. 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 Follow up in 3 months- check lipid panel at that time as she plans to drastically change diet between now and then. The patient indicates understanding of these issues and agrees with the plan.

## 2014-10-27 NOTE — Patient Instructions (Signed)
Great to see you. We are starting Metformin 500 mg daily with breakfast. You will here from [place] or from [facility] about diabetic teaching.

## 2014-11-07 ENCOUNTER — Other Ambulatory Visit: Payer: Self-pay | Admitting: Family Medicine

## 2014-11-25 ENCOUNTER — Encounter: Payer: Self-pay | Admitting: *Deleted

## 2014-11-25 ENCOUNTER — Encounter: Payer: 59 | Attending: Family Medicine | Admitting: *Deleted

## 2014-11-25 VITALS — BP 122/70 | Ht 70.0 in | Wt 247.1 lb

## 2014-11-25 DIAGNOSIS — E119 Type 2 diabetes mellitus without complications: Secondary | ICD-10-CM | POA: Diagnosis not present

## 2014-11-25 LAB — HM DIABETES EYE EXAM

## 2014-11-25 NOTE — Patient Instructions (Signed)
Check blood sugars 2 x day before breakfast and 2 hrs after supper 3-4 x week  Exercise: Begin walking for 15 minutes 3 days a week and gradually increase to 30 minutes 5 x week Eat 3 meals day,  1-2  snacks a day Space meals 4-6 hours apart Bring blood sugar records to the next appointment/class Call your doctor for a prescription for:  1. Meter strips (type) One Touch Ultra Blue  checking  1 times per day  2. Lancets (type) One Touch Delica  checking  1  times per day

## 2014-11-25 NOTE — Progress Notes (Signed)
Diabetes Self-Management Education  Visit Type: First/Initial  Appt. Start Time: 1325 Appt. End Time: 1440  11/25/2014  [name], identified by name and date of birth, is a 54 y.o. female with a diagnosis of Diabetes: Type 2.   ASSESSMENT Blood pressure 122/70, height 5\' 10"  (1.778 m), weight 247 lb 1.6 oz (112.084 kg). Body mass index is 35.46 kg/(m^2).  Initial Visit Information: Are you currently following a meal plan?: Yes What type of meal plan do you follow?: dcereased carbs and sweets Are you taking your medications as prescribed?: Yes Are you checking your feet?: Yes How many days per week are you checking your feet?: 7 How often do you need to have someone help you when you read instructions, pamphlets, or other written materials from your doctor or pharmacy?: 1 - Never What is the last grade level you completed in school?: Associate degree in nursing  Psychosocial: Patient Belief/Attitude about Diabetes: Motivated to manage diabetes Self-care barriers: None Self-management support: Friends, Family Patient Concerns: Nutrition/Meal planning, Medication, Monitoring, Healthy Lifestyle, Glycemic Control, Weight Control Special Needs: None Preferred Learning Style: Visual, Auditory Learning Readiness: Change in progress  Complications:  Last HgB A1C per patient/outside source: 7.2 % (10/14/14) How often do you check your blood sugar?: 0 times/day (not testing) (Provided One Touch Ultra Mini meter and instructed on use. BG upon return demonstration was 132 mg/dL at 2:30 pm - 2 hrs pp.) Have you had a dilated eye exam in the past 12 months?: Yes (scheduled for dilation 12/09/14) Have you had a dental exam in the past 12 months?: Yes (May 2016)  Diet Intake: Breakfast: Optometrist [place] sausage and egg white biscuit Snack (morning): cashews or protein bar or fruit Lunch: trueky or ham sandwich on low calorie bread, chips or fruit Snack (afternoon): cashews or protein  bar or fruit Dinner: meat and non-starchy vegetables; sometimes potatoes Beverage(s): water, coffee  Exercise: Exercise: ADL's  Individualized Plan for Diabetes Self-Management Training:  Learning Objective:  Patient will have a greater understanding of diabetes self-management.  Education Topics Reviewed with Patient Today: Factors that contribute to the development of diabetes Carbohydrate counting, Food label reading, portion sizes and measuring food. Role of exercise on diabetes management, blood pressure control and cardiac health. Reviewed patients medication for diabetes, action, purpose, timing of dose and side effects. Taught/evaluated SMBG meter., Purpose and frequency of SMBG., Identified appropriate SMBG and/or A1C goals. Relationship between chronic complications and blood glucose control Role of stress on diabetes  PATIENTS GOALS/Plan (Developed by the patient): Improve blood sugars Decrease medications Prevent diabetes complications Lose weight Lead a healthier lifestyle Become more fit  Plan:  Check blood sugars 2 x day before breakfast and 2 hrs after supper 3-4 x week  Exercise: Begin walking for 15 minutes 3 days a week and gradually increase to 30 minutes 5 x week Eat 3 meals day,  1-2  snacks a day Space meals 4-6 hours apart Bring blood sugar records to the next appointment/class Call your doctor for a prescription for:  1. Meter strips (type) One Touch Ultra Blue  checking  1 times per day  2. Lancets (type) One Touch Delica  checking  1  times per day  Expected Outcomes:  Demonstrated interest in learning. Expect positive outcomes  Education material provided:  General Meal Planning Guidelines Meter - One Touch Ultra Mini  If problems or questions, patient to contact team via:  Johny Drilling, [place], [place], CDE [phone]  Future DSME appointment:  4-6 wks; pt will be changing insurance plans next month and will call to schedule classes at this  time

## 2014-11-29 ENCOUNTER — Other Ambulatory Visit: Payer: Self-pay

## 2014-12-30 ENCOUNTER — Encounter: Payer: Self-pay | Admitting: Family Medicine

## 2015-01-27 ENCOUNTER — Ambulatory Visit (INDEPENDENT_AMBULATORY_CARE_PROVIDER_SITE_OTHER): Payer: 59 | Admitting: Family Medicine

## 2015-01-27 ENCOUNTER — Encounter: Payer: Self-pay | Admitting: Family Medicine

## 2015-01-27 VITALS — BP 124/68 | HR 69 | Temp 97.7°F | Wt 244.2 lb

## 2015-01-27 DIAGNOSIS — Z23 Encounter for immunization: Secondary | ICD-10-CM

## 2015-01-27 DIAGNOSIS — E785 Hyperlipidemia, unspecified: Secondary | ICD-10-CM | POA: Diagnosis not present

## 2015-01-27 DIAGNOSIS — E119 Type 2 diabetes mellitus without complications: Secondary | ICD-10-CM | POA: Diagnosis not present

## 2015-01-27 DIAGNOSIS — E559 Vitamin D deficiency, unspecified: Secondary | ICD-10-CM

## 2015-01-27 LAB — VITAMIN D 25 HYDROXY (VIT D DEFICIENCY, FRACTURES): VITD: 25.05 ng/mL — ABNORMAL LOW (ref 30.00–100.00)

## 2015-01-27 LAB — COMPREHENSIVE METABOLIC PANEL
ALT: 26 U/L (ref 0–35)
AST: 22 U/L (ref 0–37)
Albumin: 4 g/dL (ref 3.5–5.2)
Alkaline Phosphatase: 47 U/L (ref 39–117)
BUN: 18 mg/dL (ref 6–23)
CO2: 32 mEq/L (ref 19–32)
Calcium: 9.4 mg/dL (ref 8.4–10.5)
Chloride: 101 mEq/L (ref 96–112)
Creatinine, Ser: 0.65 mg/dL (ref 0.40–1.20)
GFR: 100.25 mL/min (ref >60.00)
Glucose, Bld: 154 mg/dL — ABNORMAL HIGH (ref 70–99)
Potassium: 3.7 mEq/L (ref 3.5–5.1)
Sodium: 141 mEq/L (ref 135–145)
Total Bilirubin: 0.4 mg/dL (ref 0.2–1.2)
Total Protein: 6.9 g/dL (ref 6.0–8.3)

## 2015-01-27 LAB — MICROALBUMIN / CREATININE URINE RATIO
Creatinine,U: 174 mg/dL
Microalb Creat Ratio: 0.6 mg/g (ref 0.0–30.0)
Microalb, Ur: 1 mg/dL (ref 0.0–1.9)

## 2015-01-27 LAB — LIPID PANEL
Cholesterol: 190 mg/dL (ref 0–200)
HDL: 38.7 mg/dL — ABNORMAL LOW (ref >39.00)
LDL Cholesterol: 114 mg/dL — ABNORMAL HIGH (ref 0–99)
NonHDL: 151.33
Total CHOL/HDL Ratio: 5
Triglycerides: 186 mg/dL — ABNORMAL HIGH (ref 0.0–149.0)
VLDL: 37.2 mg/dL (ref 0.0–40.0)

## 2015-01-27 LAB — HEMOGLOBIN A1C: Hgb A1c MFr Bld: 6.2 % (ref 4.6–6.5)

## 2015-01-27 MED ORDER — VITAMIN D (ERGOCALCIFEROL) 1.25 MG (50000 UNIT) PO CAPS: 50000.0000 [IU] | ORAL_CAPSULE | ORAL | Status: DC

## 2015-01-27 NOTE — Addendum Note (Signed)
Addended by: Modena Nunnery on: 01/27/2015 07:30 AM   Modules accepted: Orders

## 2015-01-27 NOTE — Assessment & Plan Note (Signed)
Continue current dose of Metformin. Pneumococcal vaccine given today. Check urine micro, lipid panel, CMET, a1c today. The patient indicates understanding of these issues and agrees with the plan.

## 2015-01-27 NOTE — Progress Notes (Addendum)
Subjective:   Patient ID: Hannah Giles, female    DOB: [birth year], 54 y.o.   MRN: [id number]  Lagretta Loseke is a pleasant 54 y.o. year old female who presents to clinic today with Follow-up  on 01/27/2015  HPI:  New onset diabetes- diagnosed in 10/2014. Started her on Metformin 500 mg daily at that time and referred her to diabetic teaching which she has not yet attended. Lab Results  Component Value Date   HGBA1C 7.2* 10/14/2014  She has been checking her own FSBS.  Denies any episodes of hypoglycemia. Metformin did not cause stomach upset.  Wt Readings from Last 3 Encounters:  01/27/15 244 lb 4 oz (110.791 kg)  11/25/14 247 lb 1.6 oz (112.084 kg)  10/27/14 247 lb (112.038 kg)    Lab Results  Component Value Date   CHOL 202* 11/16/2013   HDL 42.30 11/16/2013   LDLCALC 123* 11/16/2013   LDLDIRECT 156.8 09/16/2012   TRIG 185.0* 11/16/2013   CHOLHDL 5 11/16/2013   Current Outpatient Prescriptions on File Prior to Visit  Medication Sig Dispense Refill  . atenolol-chlorthalidone (TENORETIC) 50-25 MG per tablet Take 1 tablet by mouth daily. 30 tablet 4  . buPROPion (WELLBUTRIN XL) 150 MG 24 hr tablet Take 1 tablet (150 mg total) by mouth daily. 30 tablet 3  . Choline Fenofibrate (FENOFIBRIC ACID) 135 MG CPDR TAKE 1 CAPSULE (135 MG TOTAL) BY MOUTH DAILY. 30 capsule 5  . metFORMIN (GLUCOPHAGE) 500 MG tablet Take 1 tablet (500 mg total) by mouth daily with breakfast. 30 tablet 3  . polyethylene glycol (MIRALAX / GLYCOLAX) packet Take 17 g by mouth daily.     No current facility-administered medications on file prior to visit.    Allergies  Allergen Reactions  . Niacin     REACTION: flushing, SOB face numb    Past Medical History  Diagnosis Date  . Hyperlipidemia   . Hypertension   . Urinary incontinence     Past Surgical History  Procedure Laterality Date  . Appendectomy    . Tonsillectomy    . Colonoscopy    . Back surgery    . Temporomandibular joint surgery      . Removal right ovary and tube      Family History  Problem Relation Age of Onset  . Cancer Mother     colon  . Colon cancer Mother   . Heart disease Father     s/p stents in the 60's  . Dementia Father   . Hypertension Sister   . Pancreatic cancer Paternal Grandfather   . Rectal cancer Neg Hx   . Stomach cancer Neg Hx   . Diabetes Paternal Uncle     Social History   Social History  . Marital Status: Married    Spouse Name: N/A  . Number of Children: N/A  . Years of Education: N/A   Occupational History  . 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 Smokeless tobacco: Never Used  . Alcohol Use: 1.8 - 2.4 oz/week    3-4 Glasses of wine per week     Comment: socially  . Drug Use: No  . Sexual Activity: Not on file   Other Topics Concern  . Not on file   Social History Narrative   The PMH, PSH, Social History, Family History, Medications, and allergies  have been reviewed in [facility], and have been updated if relevant.  Review of Systems  Constitutional: Negative.   HENT: Negative.   Respiratory: Negative.   Cardiovascular: Negative.   Gastrointestinal: Negative.   Endocrine: Negative.   Genitourinary: Negative.   Musculoskeletal: Negative.   Skin: Negative.   Allergic/Immunologic: Negative.   Neurological: Negative.   Hematological: Negative.   Psychiatric/Behavioral: Negative.   All other systems reviewed and are negative.      Objective:    BP 124/68 mmHg  Pulse 69  Temp(Src) 97.7 F (36.5 C) (Oral)  Wt 244 lb 4 oz (110.791 kg)  SpO2 96%   Physical Exam  Constitutional: She is oriented to person, place, and time. She appears well-developed and well-nourished. No distress.  HENT:  Head: Normocephalic.  Eyes: Conjunctivae are normal.  Neck: Normal range of motion.  Cardiovascular: Normal rate and regular rhythm.    Pulmonary/Chest: Effort normal and breath sounds normal. No respiratory distress. She has no wheezes.  Musculoskeletal: Normal range of motion. She exhibits no edema.  Neurological: She is alert and oriented to person, place, and time. No cranial nerve deficit.  Skin: Skin is warm and dry.  Psychiatric: She has a normal mood and affect. Her behavior is normal. Judgment and thought content normal.  Nursing note and vitals reviewed.         Assessment & Plan:   Diabetes mellitus, new onset - Plan: Hemoglobin A1c, Microalbumin / creatinine urine ratio, Comprehensive metabolic panel, Lipid panel  HLD (hyperlipidemia) No Follow-up on file.

## 2015-01-27 NOTE — Progress Notes (Signed)
Pre visit review using our clinic review tool, if applicable. No additional management support is needed unless otherwise documented below in the visit note. 

## 2015-01-27 NOTE — Addendum Note (Signed)
Addended by: Lucille Passy on: 01/27/2015 07:31 AM   Modules accepted: Orders

## 2015-01-27 NOTE — Patient Instructions (Signed)
Great to see you. We will call you with your lab results and you can view them online.  

## 2015-01-27 NOTE — Addendum Note (Signed)
Addended by: Modena Nunnery on: 01/27/2015 04:10 PM   Modules accepted: Orders

## 2015-02-01 ENCOUNTER — Other Ambulatory Visit: Payer: Self-pay | Admitting: *Deleted

## 2015-02-01 MED ORDER — BUPROPION HCL ER (XL) 150 MG PO TB24: 150.0000 mg | ORAL_TABLET | Freq: Every day | ORAL | Status: DC

## 2015-02-14 ENCOUNTER — Telehealth: Payer: Self-pay

## 2015-02-14 NOTE — Telephone Encounter (Signed)
PLEASE NOTE: All timestamps contained within this report are represented as [place] Standard Time. CONFIDENTIALTY NOTICE: This fax transmission is intended only for the addressee. It contains information that is legally privileged, confidential or otherwise protected from use or disclosure. If you are not the intended recipient, you are strictly prohibited from reviewing, disclosing, copying using or disseminating any of this information or taking any action in reliance on or regarding this information. If you have received this fax in error, please notify us immediately by telephone so that we can arrange for its return to [place]. Phone: [phone], Toll-Free: [phone], Fax: [phone] Page: 1 of 1 Call Id: [id number] [address] Patient Name: Hannah Giles Gender: Female DOB: 03-05-1961 Age: 54 Y 11 M 11 D Return Phone Number: [phone] (Primary) Address: City/State/Zip: [place] Client [place] Night - Client Client Site [facility] Physician Deborra Medina, [place] Type Call Call Type Triage / Clinical Relationship To Patient Self Return Phone Number [phone] (Primary) Chief Complaint Prescription Refill or Medication Request (non symptomatic) Initial Comment Caller States she thinks she is getting a UTI and is currently traveling, would like to have something called in (is currently in [place]) Nurse Assessment Nurse: [name], RN, [name] Date/Time [name] Time): 02/12/2015 10:56:25 AM Confirm and document reason for call. If symptomatic, describe symptoms. ---Caller States she thinks she is getting a UTI and is currently traveling, would like to have something called in (is currently in [place]). Has the patient traveled out of the country within the last 30 days? ---Not Applicable Does the patient require triage? ---Declined Triage Please  document clinical information provided and list any resource used. ---Caller advised MD does not call in meds after hours for new sx. Declined triage Guidelines Guideline Title Affirmed Question Affirmed Notes Nurse Date/Time (Eastern Time) Disp. Time [name] Time) Disposition Final User 02/12/2015 11:13:41 AM Clinical Call Yes [name] RN, [name] After Care Instructions Given Call Event Type User Date / Time Description

## 2015-02-21 ENCOUNTER — Encounter: Payer: Self-pay | Admitting: Family Medicine

## 2015-02-28 ENCOUNTER — Other Ambulatory Visit: Payer: Self-pay | Admitting: Family Medicine

## 2015-02-28 ENCOUNTER — Encounter: Payer: Self-pay | Admitting: *Deleted

## 2015-02-28 ENCOUNTER — Encounter: Payer: Self-pay | Admitting: Family Medicine

## 2015-02-28 NOTE — Progress Notes (Signed)
Pt has not responded regarding diabetes classes. Emailed her on 01/07/15 and she reported that her new benefits started 01/03/15 and she was waiting on her insurance card. If patient completed [facility] Diabetes Self Management program then she could be in Link To Wellness program with [facility] and receive free diabetes supplies and medication. Will discharge.

## 2015-03-01 ENCOUNTER — Other Ambulatory Visit: Payer: Self-pay | Admitting: Family Medicine

## 2015-03-01 DIAGNOSIS — R300 Dysuria: Secondary | ICD-10-CM

## 2015-03-02 ENCOUNTER — Other Ambulatory Visit: Payer: Self-pay | Admitting: *Deleted

## 2015-03-02 DIAGNOSIS — R300 Dysuria: Secondary | ICD-10-CM

## 2015-03-04 ENCOUNTER — Encounter: Payer: Self-pay | Admitting: Family Medicine

## 2015-03-10 ENCOUNTER — Other Ambulatory Visit: Payer: Self-pay | Admitting: Family Medicine

## 2015-03-10 MED ORDER — NITROFURANTOIN MONOHYD MACRO 100 MG PO CAPS: 100.0000 mg | ORAL_CAPSULE | Freq: Two times a day (BID) | ORAL | Status: DC

## 2015-04-01 ENCOUNTER — Other Ambulatory Visit: Payer: 59

## 2015-04-01 ENCOUNTER — Other Ambulatory Visit: Payer: 59 | Admitting: *Deleted

## 2015-04-01 ENCOUNTER — Encounter: Payer: Self-pay | Admitting: Family Medicine

## 2015-04-01 LAB — LIPID PANEL
Cholesterol: 215 mg/dL — ABNORMAL HIGH (ref 125–200)
HDL: 44 mg/dL — ABNORMAL LOW (ref >=46)
LDL Cholesterol: 132 mg/dL — ABNORMAL HIGH (ref <130)
Total CHOL/HDL Ratio: 4.9 Ratio (ref <=5.0)
Triglycerides: 193 mg/dL — ABNORMAL HIGH (ref <150)
VLDL: 39 mg/dL — ABNORMAL HIGH (ref <30)

## 2015-04-01 LAB — COMPREHENSIVE METABOLIC PANEL
ALT: 30 U/L — ABNORMAL HIGH (ref 6–29)
AST: 26 U/L (ref 10–35)
Albumin: 4.2 g/dL (ref 3.6–5.1)
Alkaline Phosphatase: 50 U/L (ref 33–130)
BUN: 20 mg/dL (ref 7–25)
CO2: 29 mmol/L (ref 20–31)
Calcium: 9.4 mg/dL (ref 8.6–10.4)
Chloride: 98 mmol/L (ref 98–110)
Creat: 0.66 mg/dL (ref 0.50–1.05)
Glucose, Bld: 160 mg/dL — ABNORMAL HIGH (ref 65–99)
Potassium: 3.5 mmol/L (ref 3.5–5.3)
Sodium: 138 mmol/L (ref 135–146)
Total Bilirubin: 0.5 mg/dL (ref 0.2–1.2)
Total Protein: 6.9 g/dL (ref 6.1–8.1)

## 2015-04-01 LAB — TSH: TSH: 2.611 u[IU]/mL (ref 0.350–4.500)

## 2015-04-02 LAB — CBC WITH DIFFERENTIAL/PLATELET
Basophils Absolute: 0 10*3/uL (ref 0.0–0.1)
Basophils Relative: 0 % (ref 0–1)
Eosinophils Absolute: 0.1 10*3/uL (ref 0.0–0.7)
Eosinophils Relative: 1 % (ref 0–5)
HCT: 40.1 % (ref 36.0–46.0)
Hemoglobin: 13.3 g/dL (ref 12.0–15.0)
Lymphocytes Relative: 26 % (ref 12–46)
Lymphs Abs: 2.3 10*3/uL (ref 0.7–4.0)
MCH: 28.6 pg (ref 26.0–34.0)
MCHC: 33.2 g/dL (ref 30.0–36.0)
MCV: 86.2 fL (ref 78.0–100.0)
MPV: 9.5 fL (ref 8.6–12.4)
Monocytes Absolute: 0.3 10*3/uL (ref 0.1–1.0)
Monocytes Relative: 3 % (ref 3–12)
Neutro Abs: 6.3 10*3/uL (ref 1.7–7.7)
Neutrophils Relative %: 70 % (ref 43–77)
Platelets: 215 10*3/uL (ref 150–400)
RBC: 4.65 MIL/uL (ref 3.87–5.11)
RDW: 14 % (ref 11.5–15.5)
WBC: 9 10*3/uL (ref 4.0–10.5)

## 2015-04-02 LAB — VITAMIN D 25 HYDROXY (VIT D DEFICIENCY, FRACTURES): Vit D, 25-Hydroxy: 28 ng/mL — ABNORMAL LOW (ref 30–100)

## 2015-04-02 LAB — HEMOGLOBIN A1C
Hgb A1c MFr Bld: 6.6 % — ABNORMAL HIGH (ref <5.7)
Mean Plasma Glucose: 143 mg/dL — ABNORMAL HIGH (ref <117)

## 2015-04-06 ENCOUNTER — Ambulatory Visit (INDEPENDENT_AMBULATORY_CARE_PROVIDER_SITE_OTHER): Payer: 59 | Admitting: Family Medicine

## 2015-04-06 ENCOUNTER — Other Ambulatory Visit: Payer: Self-pay | Admitting: Family Medicine

## 2015-04-06 ENCOUNTER — Encounter: Payer: Self-pay | Admitting: Family Medicine

## 2015-04-06 VITALS — BP 140/80 | HR 78 | Temp 98.0°F | Wt 242.0 lb

## 2015-04-06 DIAGNOSIS — R300 Dysuria: Secondary | ICD-10-CM

## 2015-04-06 DIAGNOSIS — I10 Essential (primary) hypertension: Secondary | ICD-10-CM

## 2015-04-06 DIAGNOSIS — E785 Hyperlipidemia, unspecified: Secondary | ICD-10-CM | POA: Diagnosis not present

## 2015-04-06 DIAGNOSIS — E559 Vitamin D deficiency, unspecified: Secondary | ICD-10-CM | POA: Diagnosis not present

## 2015-04-06 DIAGNOSIS — E119 Type 2 diabetes mellitus without complications: Secondary | ICD-10-CM

## 2015-04-06 MED ORDER — METFORMIN HCL 500 MG PO TABS: 500.0000 mg | ORAL_TABLET | Freq: Every day | ORAL | Status: DC

## 2015-04-06 MED ORDER — BUPROPION HCL ER (XL) 150 MG PO TB24: 150.0000 mg | ORAL_TABLET | Freq: Every day | ORAL | Status: DC

## 2015-04-06 MED ORDER — VITAMIN D (ERGOCALCIFEROL) 1.25 MG (50000 UNIT) PO CAPS: 50000.0000 [IU] | ORAL_CAPSULE | ORAL | Status: DC

## 2015-04-06 NOTE — Patient Instructions (Signed)
Great to see you. Please work on [employer] as discussed.  Please come see me and for labs in 3 months.

## 2015-04-06 NOTE — Assessment & Plan Note (Signed)
Deteriorated.  She will go ahead and schedule diabetic teaching, work on diet. Follow up in 3 months.

## 2015-04-06 NOTE — Assessment & Plan Note (Signed)
At goal. No changes made 

## 2015-04-06 NOTE — Progress Notes (Signed)
Subjective:   Patient ID: Hannah Giles, female    DOB: 1960/06/20, 54 y.o.   MRN: [id number]  Hannah Giles is a pleasant 54 y.o. year old female who presents to clinic today with Follow-up  on 04/06/2015  HPI:  New onset diabetes- diagnosed in 10/2014.  a1c has deteriorated a little this month.  Lipids not at goal and have deteriorated this month as well. Started her on Metformin 500 mg daily at that time and referred her to diabetic teaching which she has not  attended.  Lab Results  Component Value Date   HGBA1C 6.6* 04/01/2015  She has been checking her own FSBS.  Denies any episodes of hypoglycemia. Metformin did not cause stomach upset.    Admits to not eating well.  Eating drug rep lunches at work. Wt Readings from Last 3 Encounters:  04/06/15 242 lb (109.77 kg)  01/27/15 244 lb 4 oz (110.791 kg)  11/25/14 247 lb 1.6 oz (112.084 kg)    Lab Results  Component Value Date   CHOL 215* 04/01/2015   HDL 44* 04/01/2015   LDLCALC 132* 04/01/2015   LDLDIRECT 156.8 09/16/2012   TRIG 193* 04/01/2015   CHOLHDL 4.9 04/01/2015   Vit D deficiency- just finished 7 weeks of weekly 50,000 IU of Vit D.  Feels better but still tired. Vit D remains low. Current Outpatient Prescriptions on File Prior to Visit  Medication Sig Dispense Refill  . atenolol-chlorthalidone (TENORETIC) 50-25 MG per tablet TAKE 1 TABLET BY MOUTH DAILY 30 tablet 11  . Choline Fenofibrate (FENOFIBRIC ACID) 135 MG CPDR TAKE 1 CAPSULE (135 MG TOTAL) BY MOUTH DAILY. 30 capsule 5  . nitrofurantoin, macrocrystal-monohydrate, (MACROBID) 100 MG capsule Take 1 capsule (100 mg total) by mouth 2 (two) times daily. 14 capsule 0  . polyethylene glycol (MIRALAX / GLYCOLAX) packet Take 17 g by mouth daily.    . Vitamin D, Ergocalciferol, (DRISDOL) 50000 UNITS CAPS capsule Take 1 capsule (50,000 Units total) by mouth every 7 (seven) days. 7 capsule 0   No current facility-administered medications on file prior to visit.     Allergies  Allergen Reactions  . Niacin     REACTION: flushing, SOB face numb    Past Medical History  Diagnosis Date  . Hyperlipidemia   . Hypertension   . Urinary incontinence     Past Surgical History  Procedure Laterality Date  . Appendectomy    . Tonsillectomy    . Colonoscopy    . Back surgery    . Temporomandibular joint surgery    . Removal right ovary and tube      Family History  Problem Relation Age of Onset  . Cancer Mother     colon  . Colon cancer Mother   . Heart disease Father     s/p stents in the 60's  . Dementia Father   . Hypertension Sister   . Pancreatic cancer Paternal Grandfather   . Rectal cancer Neg Hx   . Stomach cancer Neg Hx   . Diabetes Paternal Uncle     Social History   Social History  . Marital Status: Married    Spouse Name: N/A  . Number of Children: N/A  . Years of Education: N/A   Occupational History  . RN, [employer] History Main Topics  . Smoking status: Former Smoker -- 0.50 packs/day for 13 years    Types: Cigarettes    Quit date: 06/04/1989  . Smokeless tobacco: Never  Used  . Alcohol Use: 1.8 - 2.4 oz/week    3-4 Glasses of wine per week     Comment: socially  . Drug Use: No  . Sexual Activity: Not on file   Other Topics Concern  . Not on file   Social History Narrative   The PMH, PSH, Social History, Family History, Medications, and allergies have been reviewed in [facility], and have been updated if relevant.  Review of Systems  Constitutional: Negative.   HENT: Negative.   Respiratory: Negative.   Cardiovascular: Negative.   Gastrointestinal: Negative.   Endocrine: Negative.   Genitourinary: Negative.   Musculoskeletal: Negative.   Skin: Negative.   Allergic/Immunologic: Negative.   Neurological: Negative.   Hematological: Negative.   Psychiatric/Behavioral: Negative.   All other systems reviewed and are negative.      Objective:    BP 140/80 mmHg  Pulse 78  Temp(Src)  98 F (36.7 C) (Tympanic)  Wt 242 lb (109.77 kg)  SpO2 98%   Physical Exam  Constitutional: She is oriented to person, place, and time. She appears well-developed and well-nourished. No distress.  HENT:  Head: Normocephalic.  Eyes: Conjunctivae are normal.  Neck: Normal range of motion.  Cardiovascular: Normal rate and regular rhythm.   Pulmonary/Chest: Effort normal and breath sounds normal. No respiratory distress. She has no wheezes.  Musculoskeletal: Normal range of motion. She exhibits no edema.  Neurological: She is alert and oriented to person, place, and time. No cranial nerve deficit.  Skin: Skin is warm and dry.  Psychiatric: She has a normal mood and affect. Her behavior is normal. Judgment and thought content normal.  Nursing note and vitals reviewed.         Assessment & Plan:   Diabetes mellitus, new onset ([place])  Essential hypertension  HLD (hyperlipidemia) No Follow-up on file.

## 2015-04-06 NOTE — Progress Notes (Signed)
Pre visit review using our clinic review tool, if applicable. No additional management support is needed unless otherwise documented below in the visit note. 

## 2015-04-06 NOTE — Assessment & Plan Note (Signed)
Deteriorated and not at goal for a diabetic.  She is refusing statin.  Wants to work on diet.

## 2015-04-06 NOTE — Assessment & Plan Note (Signed)
Repeat high dose Vit D. Remains low- eRx sent.

## 2015-04-13 ENCOUNTER — Encounter: Payer: Self-pay | Admitting: Family Medicine

## 2015-04-19 ENCOUNTER — Encounter: Payer: Self-pay | Admitting: Family Medicine

## 2015-04-19 MED ORDER — FENOFIBRIC ACID 135 MG PO CPDR: DELAYED_RELEASE_CAPSULE | ORAL | Status: DC

## 2015-04-19 MED ORDER — ATENOLOL-CHLORTHALIDONE 50-25 MG PO TABS: 1.0000 | ORAL_TABLET | Freq: Every day | ORAL | Status: DC

## 2015-04-20 ENCOUNTER — Ambulatory Visit: Admitting: Family Medicine

## 2015-04-28 ENCOUNTER — Telehealth: Payer: 59 | Admitting: Family

## 2015-04-28 DIAGNOSIS — N390 Urinary tract infection, site not specified: Secondary | ICD-10-CM | POA: Diagnosis not present

## 2015-04-28 MED ORDER — SULFAMETHOXAZOLE-TRIMETHOPRIM 800-160 MG PO TABS: 1.0000 | ORAL_TABLET | Freq: Two times a day (BID) | ORAL | Status: DC

## 2015-04-28 NOTE — Progress Notes (Signed)

## 2015-05-02 ENCOUNTER — Encounter: Payer: 59 | Attending: Family Medicine | Admitting: Dietician

## 2015-05-02 ENCOUNTER — Encounter: Payer: Self-pay | Admitting: Family Medicine

## 2015-05-02 VITALS — Ht 70.0 in | Wt 239.3 lb

## 2015-05-02 DIAGNOSIS — E119 Type 2 diabetes mellitus without complications: Secondary | ICD-10-CM | POA: Diagnosis not present

## 2015-05-02 NOTE — Progress Notes (Signed)

## 2015-05-09 ENCOUNTER — Encounter: Payer: 59 | Attending: Family Medicine | Admitting: Dietician

## 2015-05-09 VITALS — Wt 237.6 lb

## 2015-05-09 DIAGNOSIS — E119 Type 2 diabetes mellitus without complications: Secondary | ICD-10-CM | POA: Diagnosis not present

## 2015-05-09 NOTE — Progress Notes (Signed)
Appt. Start Time: 1730 Appt. End Time: 2030  Class 2 Diabetes Overview - define DM; state own type of DM; identify functions of pancreas and insulin; define insulin deficiency vs insulin resistance  Psychosocial - identify DM as a source of stress; state the effects of stress on BG control; verbalize appropriate stress management techniques; identify personal stress issues   Nutritional Management - describe effects of food on blood glucose; identify sources of carbohydrate, protein and fat; verbalize the importance of balance meals in controlling blood glucose; identify meals as well balanced or not; estimate servings of carbohydrate from menus; use food labels to identify servings size, content of carbohydrate, fiber, protein, fat, saturated fat and sodium; recognize food sources of fat, saturated fat, trans fat, sodium and verbalize goals for intake; describe healthful appropriate food choices when dining out   Exercise - describe the effects of exercise on blood glucose and importance of regular exercise in controlling diabetes; state a plan for personal exercise; verbalize contraindications for exercise  Medications - state name, dose, timing of currently prescribed medications; describe types of medications available for diabetes  Self-Monitoring - state importance of HBGM and demo procedure accurately; use HBGM results to effectively manage diabetes; identify importance of regular HbA1C testing and goals for results  Acute Complications/Sick Day Guidelines - recognize hyperglycemia and hypoglycemia with causes and effects; identify blood glucose results as high, low or in control; list steps in treating and preventing high and low blood glucose; state appropriate measure to manage blood glucose when ill (need for meds, HBGM plan, when to call physician, need for fluids)  Chronic Complications/Foot, Skin, Eye Dental Care - identify possible long-term complications of diabetes (retinopathy,  neuropathy, nephropathy, cardiovascular disease, infections); explain steps in prevention and treatment of chronic complications; state importance of daily self-foot exams; describe how to examine feet and what to look for; explain appropriate eye and dental care  Lifestyle Changes/Goals & Health/Community Resources - state benefits of making appropriate lifestyle changes; identify habits that need to change (meals, tobacco, alcohol); identify strategies to reduce risk factors for personal health; set goals for proper diabetes care; state need for and frequency of healthcare follow-up; describe appropriate community resources for good health (ADA, web sites, apps)   Pregnancy/Sexual Health - define gestational diabetes; state importance of good blood glucose control and birth control prior to pregnancy; state importance of good blood glucose control in preventing sexual problems (impotence, vaginal dryness, infections, loss of desire); state relationship of blood glucose control and pregnancy outcome; describe risk of maternal and fetal complications  Teaching Materials Used: Class 2 Slide Packet A1C Pamphlet Foot Care Literature Menu Ideas Goals for Class 2 1 bottle of 10  Ultra One Touch test strips

## 2015-05-12 ENCOUNTER — Encounter: Payer: Self-pay | Admitting: Family Medicine

## 2015-05-16 ENCOUNTER — Encounter: Payer: 59 | Admitting: Dietician

## 2015-05-16 VITALS — BP 120/80 | Ht 70.0 in | Wt 237.5 lb

## 2015-05-16 DIAGNOSIS — E119 Type 2 diabetes mellitus without complications: Secondary | ICD-10-CM

## 2015-05-16 NOTE — Progress Notes (Signed)

## 2015-05-27 ENCOUNTER — Encounter: Payer: Self-pay | Admitting: *Deleted

## 2015-05-31 ENCOUNTER — Ambulatory Visit (INDEPENDENT_AMBULATORY_CARE_PROVIDER_SITE_OTHER): Payer: 59 | Admitting: Family Medicine

## 2015-05-31 ENCOUNTER — Other Ambulatory Visit: Payer: Self-pay | Admitting: Family Medicine

## 2015-05-31 ENCOUNTER — Encounter: Payer: Self-pay | Admitting: Family Medicine

## 2015-05-31 VITALS — BP 116/64 | HR 73 | Temp 98.3°F | Wt 235.8 lb

## 2015-05-31 DIAGNOSIS — R300 Dysuria: Secondary | ICD-10-CM

## 2015-05-31 DIAGNOSIS — N390 Urinary tract infection, site not specified: Secondary | ICD-10-CM | POA: Diagnosis not present

## 2015-05-31 LAB — POCT URINALYSIS DIPSTICK
Glucose, UA: NEGATIVE
Ketones, UA: NEGATIVE
Nitrite, UA: NEGATIVE
Spec Grav, UA: >=1.03
Urobilinogen, UA: 0.2
pH, UA: 6

## 2015-05-31 MED ORDER — CEPHALEXIN 500 MG PO CAPS: 500.0000 mg | ORAL_CAPSULE | Freq: Two times a day (BID) | ORAL | Status: DC

## 2015-05-31 NOTE — Progress Notes (Signed)
SUBJECTIVE: Hannah Giles is a 54 y.o. female who complains of urinary frequency, urgency and dysuria x 1 day, without flank pain, fever, chills, or abnormal vaginal discharge or bleeding.   H/o recurrent UTIs. Just finished macrobid.  On 03/02/15- urine cxr grew ecoli resistant to cipro and bactrim.   Current Outpatient Prescriptions on File Prior to Visit  Medication Sig Dispense Refill  . atenolol-chlorthalidone (TENORETIC) 50-25 MG tablet Take 1 tablet by mouth daily. 90 tablet 3  . buPROPion (WELLBUTRIN XL) 150 MG 24 hr tablet Take 1 tablet (150 mg total) by mouth daily. 90 tablet 3  . Choline Fenofibrate (FENOFIBRIC ACID) 135 MG CPDR TAKE 1 CAPSULE (135 MG TOTAL) BY MOUTH DAILY. 90 capsule 1  . metFORMIN (GLUCOPHAGE) 500 MG tablet Take 1 tablet (500 mg total) by mouth daily with breakfast. 90 tablet 3  . polyethylene glycol (MIRALAX / GLYCOLAX) packet Take 17 g by mouth daily.     No current facility-administered medications on file prior to visit.    Allergies  Allergen Reactions  . Niacin     REACTION: flushing, SOB face numb    Past Medical History  Diagnosis Date  . Hyperlipidemia   . Hypertension   . Urinary incontinence     Past Surgical History  Procedure Laterality Date  . Appendectomy    . Tonsillectomy    . Colonoscopy    . Back surgery    . Temporomandibular joint surgery    . Removal right ovary and tube      Family History  Problem Relation Age of Onset  . Cancer Mother     colon  . Colon cancer Mother   . Heart disease Father     s/p stents in the 60's  . Dementia Father   . Hypertension Sister   . Pancreatic cancer Paternal Grandfather   . Rectal cancer Neg Hx   . Stomach cancer Neg Hx   . Diabetes Paternal Uncle     Social History   Social History  . Marital Status: Married    Spouse Name: N/A  . Number of Children: N/A  . Years of Education: N/A   Occupational History  . RN, [place] History Main Topics  .  Smoking status: Former Smoker -- 0.50 packs/day for 13 years    Types: Cigarettes    Quit date: 06/04/1989  . Smokeless tobacco: Never Used  . Alcohol Use: 1.8 - 2.4 oz/week    3-4 Glasses of wine per week     Comment: socially  . Drug Use: No  . Sexual Activity: Not on file   Other Topics Concern  . Not on file   Social History Narrative   The PMH, PSH, Social History, Family History, Medications, and allergies have been reviewed in [facility], and have been updated if relevant.  OBJECTIVE:  BP 116/64 mmHg  Pulse 73  Temp(Src) 98.3 F (36.8 C) (Oral)  Wt 235 lb 12 oz (106.935 kg)  SpO2 96% Appears well, in no apparent distress.  Vital signs are normal. The abdomen is soft without tenderness, guarding, mass, rebound or organomegaly. No CVA tenderness or inguinal adenopathy noted. Urine dipstick shows positive for WBC's, positive for nitrates and positive for leukocytes.    ASSESSMENT: Recurrent UTI without evidence of pyelonephritis  PLAN: Treatment per orders Keflex 500 mg twice daily 7 days- also push fluids, may use Pyridium OTC prn. Call or return to clinic prn if these symptoms worsen or fail to  improve as anticipated.

## 2015-05-31 NOTE — Patient Instructions (Signed)
Great to see you. Please take as Keflex as directed- 1 tablet twice daily x 7 days.  We will call you with urology referral.

## 2015-05-31 NOTE — Progress Notes (Signed)
Pre visit review using our clinic review tool, if applicable. No additional management support is needed unless otherwise documented below in the visit note. 

## 2015-06-01 ENCOUNTER — Encounter: Payer: Self-pay | Admitting: Family Medicine

## 2015-06-01 LAB — URINE CULTURE: Colony Count: 8000

## 2015-06-17 ENCOUNTER — Encounter: Payer: Self-pay | Admitting: Family Medicine

## 2015-06-17 ENCOUNTER — Other Ambulatory Visit (INDEPENDENT_AMBULATORY_CARE_PROVIDER_SITE_OTHER): Payer: 59 | Admitting: *Deleted

## 2015-06-17 DIAGNOSIS — E119 Type 2 diabetes mellitus without complications: Secondary | ICD-10-CM | POA: Diagnosis not present

## 2015-06-17 NOTE — Addendum Note (Signed)
Addended by: Eulis Foster on: 06/17/2015 01:51 PM   Modules accepted: Orders

## 2015-06-18 LAB — URINALYSIS
Bilirubin Urine: NEGATIVE
Glucose, UA: NEGATIVE
Ketones, ur: NEGATIVE
Nitrite: NEGATIVE
Protein, ur: NEGATIVE
Specific Gravity, Urine: 1.025 (ref 1.001–1.035)
pH: 6.5 (ref 5.0–8.0)

## 2015-06-20 ENCOUNTER — Other Ambulatory Visit: Payer: Self-pay | Admitting: Family Medicine

## 2015-06-20 ENCOUNTER — Telehealth: Payer: Self-pay | Admitting: Family Medicine

## 2015-06-20 LAB — URINE CULTURE: Colony Count: >=100000

## 2015-06-20 MED ORDER — CEPHALEXIN 500 MG PO CAPS: 500.0000 mg | ORAL_CAPSULE | Freq: Two times a day (BID) | ORAL | Status: DC

## 2015-06-20 MED FILL — CEPHALEXIN 500 MG CAPSULE: 500 | 7 days supply | Qty: 14 | Fill #0

## 2015-06-20 NOTE — Telephone Encounter (Signed)
Pt returned your call.  

## 2015-06-28 MED FILL — metFORMIN HCL 500 MG TABS: 500 | 90 days supply | Qty: 90 | Fill #1

## 2015-06-28 MED FILL — BUPROPION HCL XL 150 MG TAB: 150 | 90 days supply | Qty: 90 | Fill #1

## 2015-07-12 DIAGNOSIS — N952 Postmenopausal atrophic vaginitis: Secondary | ICD-10-CM | POA: Diagnosis not present

## 2015-07-12 DIAGNOSIS — Z0000 Encounter for general adult medical examination without abnormal findings: Secondary | ICD-10-CM | POA: Diagnosis not present

## 2015-07-12 DIAGNOSIS — N3020 Other chronic cystitis without hematuria: Secondary | ICD-10-CM | POA: Diagnosis not present

## 2015-07-12 DIAGNOSIS — N3946 Mixed incontinence: Secondary | ICD-10-CM | POA: Diagnosis not present

## 2015-07-12 MED FILL — PREMARIN VAGINAL CREAM-APPL: 0.625 | 90 days supply | Qty: 30 | Fill #0

## 2015-07-12 MED FILL — ATENOLOL/CHLORTHAL 50/25: 50-25 | 90 days supply | Qty: 90 | Fill #1

## 2015-07-12 MED FILL — FENOFIBRIC ACID DR 135 MG C: 135 | 90 days supply | Qty: 90 | Fill #1

## 2015-07-14 ENCOUNTER — Encounter: Payer: Self-pay | Admitting: Family Medicine

## 2015-07-14 ENCOUNTER — Other Ambulatory Visit (INDEPENDENT_AMBULATORY_CARE_PROVIDER_SITE_OTHER): Payer: 59 | Admitting: *Deleted

## 2015-07-14 ENCOUNTER — Telehealth: Payer: Self-pay | Admitting: Family Medicine

## 2015-07-14 DIAGNOSIS — N342 Other urethritis: Secondary | ICD-10-CM | POA: Diagnosis not present

## 2015-07-14 DIAGNOSIS — R300 Dysuria: Secondary | ICD-10-CM | POA: Diagnosis not present

## 2015-07-14 LAB — URINALYSIS, ROUTINE W REFLEX MICROSCOPIC
Bilirubin Urine: NEGATIVE
Glucose, UA: NEGATIVE
Nitrite: NEGATIVE
Specific Gravity, Urine: 1.026 (ref 1.001–1.035)
pH: 6.5 (ref 5.0–8.0)

## 2015-07-14 LAB — URINALYSIS, MICROSCOPIC ONLY
Bacteria, UA: NONE SEEN [HPF]
Casts: NONE SEEN [LPF]
Crystals: NONE SEEN [HPF]
Squamous Epithelial / LPF: NONE SEEN [HPF] (ref <=5)
Yeast: NONE SEEN [HPF]

## 2015-07-14 NOTE — Addendum Note (Signed)
Addended by: Eulis Foster on: 07/14/2015 11:31 AM   Modules accepted: Orders

## 2015-07-14 NOTE — Telephone Encounter (Signed)
Lm on pts vm requesting a call back 

## 2015-07-14 NOTE — Telephone Encounter (Signed)
Spoke to pt and advised the culture is being processed and Dr Deborra Medina will treat accordingly based on results

## 2015-07-14 NOTE — Telephone Encounter (Signed)
Pt request cb about my chart message  Please call [phone] thanks

## 2015-07-15 LAB — URINE CULTURE
Colony Count: NO GROWTH
Organism ID, Bacteria: NO GROWTH

## 2015-07-26 ENCOUNTER — Ambulatory Visit: Payer: Self-pay

## 2015-08-02 ENCOUNTER — Ambulatory Visit: Payer: 59

## 2015-08-15 DIAGNOSIS — N3020 Other chronic cystitis without hematuria: Secondary | ICD-10-CM | POA: Diagnosis not present

## 2015-08-15 DIAGNOSIS — K760 Fatty (change of) liver, not elsewhere classified: Secondary | ICD-10-CM | POA: Diagnosis not present

## 2015-08-23 DIAGNOSIS — N952 Postmenopausal atrophic vaginitis: Secondary | ICD-10-CM | POA: Diagnosis not present

## 2015-08-23 DIAGNOSIS — N3946 Mixed incontinence: Secondary | ICD-10-CM | POA: Diagnosis not present

## 2015-08-23 DIAGNOSIS — K5909 Other constipation: Secondary | ICD-10-CM | POA: Diagnosis not present

## 2015-08-23 DIAGNOSIS — N3020 Other chronic cystitis without hematuria: Secondary | ICD-10-CM | POA: Diagnosis not present

## 2015-10-03 MED FILL — BUPROPION HCL XL 150 MG TAB: 150 | 90 days supply | Qty: 90 | Fill #2

## 2015-10-03 MED FILL — metFORMIN HCL 500 MG TABS: 500 | 90 days supply | Qty: 90 | Fill #2

## 2015-10-19 MED FILL — ATENOLOL/CHLORTHAL 50/25: 50-25 | 90 days supply | Qty: 90 | Fill #2

## 2015-10-20 ENCOUNTER — Other Ambulatory Visit: Payer: Self-pay | Admitting: *Deleted

## 2015-10-20 MED ORDER — FENOFIBRIC ACID 135 MG PO CPDR: DELAYED_RELEASE_CAPSULE | ORAL | Status: DC

## 2015-10-20 MED FILL — FENOFIBRIC ACID DR 135 MG C: 135 | 30 days supply | Qty: 30 | Fill #0

## 2015-11-27 ENCOUNTER — Encounter: Payer: Self-pay | Admitting: Family Medicine

## 2015-11-28 MED ORDER — FENOFIBRIC ACID 135 MG PO CPDR: DELAYED_RELEASE_CAPSULE | ORAL | Status: DC

## 2015-11-28 MED FILL — FENOFIBRIC ACID DR 135 MG C: 135 | 90 days supply | Qty: 90 | Fill #0

## 2015-11-28 NOTE — Addendum Note (Signed)
Addended by: Modena Nunnery on: 11/28/2015 08:48 AM   Modules accepted: Orders

## 2015-12-27 ENCOUNTER — Other Ambulatory Visit: Payer: 59 | Admitting: *Deleted

## 2015-12-27 ENCOUNTER — Other Ambulatory Visit: Payer: 59

## 2015-12-27 DIAGNOSIS — E559 Vitamin D deficiency, unspecified: Secondary | ICD-10-CM | POA: Diagnosis not present

## 2015-12-28 LAB — HEMOGLOBIN A1C
Hgb A1c MFr Bld: 6.3 % — ABNORMAL HIGH (ref <5.7)
Mean Plasma Glucose: 134 mg/dL

## 2015-12-28 LAB — VITAMIN D 25 HYDROXY (VIT D DEFICIENCY, FRACTURES): Vit D, 25-Hydroxy: 20 ng/mL — ABNORMAL LOW (ref 30–100)

## 2015-12-28 MED FILL — BUPROPION HCL XL 150 MG TAB: 150 | 90 days supply | Qty: 90 | Fill #3

## 2015-12-28 MED FILL — metFORMIN HCL 500 MG TABS: 500 | 90 days supply | Qty: 90 | Fill #3

## 2016-01-02 ENCOUNTER — Encounter: Payer: Self-pay | Admitting: Family Medicine

## 2016-01-02 ENCOUNTER — Ambulatory Visit (INDEPENDENT_AMBULATORY_CARE_PROVIDER_SITE_OTHER): Payer: 59 | Admitting: Family Medicine

## 2016-01-02 VITALS — BP 124/62 | HR 79 | Temp 98.2°F | Wt 234.2 lb

## 2016-01-02 DIAGNOSIS — I10 Essential (primary) hypertension: Secondary | ICD-10-CM

## 2016-01-02 DIAGNOSIS — E119 Type 2 diabetes mellitus without complications: Secondary | ICD-10-CM | POA: Diagnosis not present

## 2016-01-02 DIAGNOSIS — E785 Hyperlipidemia, unspecified: Secondary | ICD-10-CM

## 2016-01-02 NOTE — Assessment & Plan Note (Signed)
a1c is at goal. Will check urine micro today. Recheck lipid panel as well.

## 2016-01-02 NOTE — Assessment & Plan Note (Signed)
Not willing to restart statins. Feels it affected her memory.

## 2016-01-02 NOTE — Assessment & Plan Note (Signed)
Well controlled. No changes made today. 

## 2016-01-02 NOTE — Progress Notes (Signed)
Subjective:   Patient ID: Hannah Giles, female    DOB: [birth year], 55 y.o.   MRN: [id number]  Tonantzin Assante is a pleasant 55 y.o. year old female who presents to clinic today with Follow-up  on 01/02/2016  HPI:  Diabetes- diagnosed in 10/2014.  a1c has deteriorated a little this month.  Lipids not at goal and have deteriorated this month as well. Started her on Metformin 500 mg daily at that time and referred her to diabetic teaching. Lab Results  Component Value Date   HGBA1C 6.3 (H) 12/27/2015  She has been checking her own FSBS.  Denies any episodes of hypoglycemia.   Wt Readings from Last 3 Encounters:  01/02/16 234 lb 4 oz (106.3 kg)  05/31/15 235 lb 12 oz (106.9 kg)  05/16/15 237 lb 8 oz (107.7 kg)   HLD- lipid panel not at goal for a diabetic. Lab Results  Component Value Date   CHOL 215 (H) 04/01/2015   HDL 44 (L) 04/01/2015   LDLCALC 132 (H) 04/01/2015   LDLDIRECT 156.8 09/16/2012   TRIG 193 (H) 04/01/2015   CHOLHDL 4.9 04/01/2015   HTN- has been normotensive on current rxs.  Lab Results  Component Value Date   CREATININE 0.66 04/01/2015    Current Outpatient Prescriptions on File Prior to Visit  Medication Sig Dispense Refill  . atenolol-chlorthalidone (TENORETIC) 50-25 MG tablet Take 1 tablet by mouth daily. 90 tablet 3  . buPROPion (WELLBUTRIN XL) 150 MG 24 hr tablet Take 1 tablet (150 mg total) by mouth daily. 90 tablet 3  . Choline Fenofibrate (FENOFIBRIC ACID) 135 MG CPDR TAKE 1 CAPSULE (135 MG TOTAL) BY MOUTH DAILY. OFFICE VISIT WITH LABS REQUIRED FOR ADDITIONAL REFILLS 90 capsule 0  . metFORMIN (GLUCOPHAGE) 500 MG tablet Take 1 tablet (500 mg total) by mouth daily with breakfast. 90 tablet 3  . polyethylene glycol (MIRALAX / GLYCOLAX) packet Take 17 g by mouth daily.     No current facility-administered medications on file prior to visit.     Allergies  Allergen Reactions  . Niacin     REACTION: flushing, SOB face numb    Past Medical History:    Diagnosis Date  . Hyperlipidemia   . Hypertension   . Urinary incontinence     Past Surgical History:  Procedure Laterality Date  . APPENDECTOMY    . BACK SURGERY    . COLONOSCOPY    . removal right ovary and tube    . TEMPOROMANDIBULAR JOINT SURGERY    . TONSILLECTOMY      Family History  Problem Relation Age of Onset  . Cancer Mother     colon  . Colon cancer Mother   . Heart disease Father     s/p stents in the 60's  . Dementia Father   . Hypertension Sister   . Pancreatic cancer Paternal Grandfather   . Rectal cancer Neg Hx   . Stomach cancer Neg Hx   . Diabetes Paternal Uncle     Social History   Social History  . Marital status: Married    Spouse name: N/A  . Number of children: N/A  . Years of education: N/A   Occupational History  . RN, [employer] History Main Topics  . Smoking status: Former Smoker    Packs/day: 0.50    Years: 13.00    Types: Cigarettes    Quit date: 06/04/1989  . Smokeless tobacco: Never Used  . Alcohol use 1.8 -  2.4 oz/week    3 - 4 Glasses of wine per week     Comment: socially  . Drug use: No  . Sexual activity: Not on file   Other Topics Concern  . Not on file   Social History Narrative  . No narrative on file   The PMH, PSH, Social History, Family History, Medications, and allergies have been reviewed in [facility], and have been updated if relevant.  Review of Systems  Constitutional: Negative.   HENT: Negative.   Respiratory: Negative.   Cardiovascular: Negative.   Gastrointestinal: Negative.   Endocrine: Negative.   Genitourinary: Negative.   Musculoskeletal: Negative.   Skin: Negative.   Allergic/Immunologic: Negative.   Neurological: Negative.   Hematological: Negative.   Psychiatric/Behavioral: Negative.   All other systems reviewed and are negative.      Objective:    BP 124/62   Pulse 79   Temp 98.2 F (36.8 C) (Oral)   Wt 234 lb 4 oz (106.3 kg)   SpO2 97%   BMI 33.61 kg/m    Wt Readings from Last 3 Encounters:  01/02/16 234 lb 4 oz (106.3 kg)  05/31/15 235 lb 12 oz (106.9 kg)  05/16/15 237 lb 8 oz (107.7 kg)     Physical Exam  Constitutional: She is oriented to person, place, and time. She appears well-developed and well-nourished. No distress.  HENT:  Head: Normocephalic.  Eyes: Conjunctivae are normal.  Neck: Normal range of motion.  Cardiovascular: Normal rate and regular rhythm.   Pulmonary/Chest: Effort normal and breath sounds normal. No respiratory distress. She has no wheezes.  Musculoskeletal: Normal range of motion. She exhibits no edema.  Neurological: She is alert and oriented to person, place, and time. No cranial nerve deficit.  Skin: Skin is warm and dry.  Psychiatric: She has a normal mood and affect. Her behavior is normal. Judgment and thought content normal.  Nursing note and vitals reviewed.         Assessment & Plan:   Diabetes mellitus, new onset ([place]) - Plan: Microalbumin / creatinine urine ratio  Essential hypertension  HLD (hyperlipidemia) - Plan: Lipid panel, Comprehensive metabolic panel No Follow-up on file.

## 2016-01-02 NOTE — Progress Notes (Signed)
Pre visit review using our clinic review tool, if applicable. No additional management support is needed unless otherwise documented below in the visit note. 

## 2016-01-03 ENCOUNTER — Other Ambulatory Visit: Payer: Self-pay | Admitting: Family Medicine

## 2016-01-03 ENCOUNTER — Encounter: Payer: Self-pay | Admitting: Family Medicine

## 2016-01-03 LAB — COMPREHENSIVE METABOLIC PANEL
ALT: 24 U/L (ref 0–35)
AST: 21 U/L (ref 0–37)
Albumin: 4.3 g/dL (ref 3.5–5.2)
Alkaline Phosphatase: 50 U/L (ref 39–117)
BUN: 19 mg/dL (ref 6–23)
CO2: 33 mEq/L — ABNORMAL HIGH (ref 19–32)
Calcium: 10.3 mg/dL (ref 8.4–10.5)
Chloride: 99 mEq/L (ref 96–112)
Creatinine, Ser: 0.68 mg/dL (ref 0.40–1.20)
GFR: 94.84 mL/min (ref >60.00)
Glucose, Bld: 118 mg/dL — ABNORMAL HIGH (ref 70–99)
Potassium: 3.8 mEq/L (ref 3.5–5.1)
Sodium: 140 mEq/L (ref 135–145)
Total Bilirubin: 0.4 mg/dL (ref 0.2–1.2)
Total Protein: 7.7 g/dL (ref 6.0–8.3)

## 2016-01-03 LAB — LIPID PANEL
Cholesterol: 230 mg/dL — ABNORMAL HIGH (ref 0–200)
HDL: 45.7 mg/dL (ref >39.00)
NonHDL: 184.08
Total CHOL/HDL Ratio: 5
Triglycerides: 212 mg/dL — ABNORMAL HIGH (ref 0.0–149.0)
VLDL: 42.4 mg/dL — ABNORMAL HIGH (ref 0.0–40.0)

## 2016-01-03 LAB — MICROALBUMIN / CREATININE URINE RATIO
Creatinine,U: 136 mg/dL
Microalb Creat Ratio: 0.7 mg/g (ref 0.0–30.0)
Microalb, Ur: 0.9 mg/dL (ref 0.0–1.9)

## 2016-01-03 LAB — LDL CHOLESTEROL, DIRECT: Direct LDL: 169 mg/dL

## 2016-01-03 MED ORDER — VITAMIN D (ERGOCALCIFEROL) 1.25 MG (50000 UNIT) PO CAPS: 50000.0000 [IU] | ORAL_CAPSULE | ORAL | 0 refills | Status: DC

## 2016-01-03 MED FILL — VIT D2 1.25 MG (50,000 UNIT: 1.25 MG | 49 days supply | Qty: 7 | Fill #0

## 2016-01-05 ENCOUNTER — Other Ambulatory Visit: Payer: Self-pay | Admitting: Family Medicine

## 2016-01-05 ENCOUNTER — Encounter: Payer: Self-pay | Admitting: Family Medicine

## 2016-01-05 DIAGNOSIS — E785 Hyperlipidemia, unspecified: Secondary | ICD-10-CM

## 2016-01-05 MED ORDER — ROSUVASTATIN CALCIUM 5 MG PO TABS: 5.0000 mg | ORAL_TABLET | Freq: Every day | ORAL | 3 refills | Status: DC

## 2016-01-05 MED FILL — ROSUVASTATIN CALCIUM 5 MG T: 5 | 90 days supply | Qty: 90 | Fill #0

## 2016-01-09 ENCOUNTER — Other Ambulatory Visit: Payer: Self-pay | Admitting: Family Medicine

## 2016-01-09 DIAGNOSIS — E785 Hyperlipidemia, unspecified: Secondary | ICD-10-CM

## 2016-01-16 MED FILL — ATENOLOL/CHLORTHAL 50/25: 50-25 | 90 days supply | Qty: 90 | Fill #3

## 2016-03-30 ENCOUNTER — Other Ambulatory Visit: Payer: Self-pay | Admitting: Family Medicine

## 2016-03-30 MED FILL — BUPROPION HCL XL 150 MG TAB: 150 | 90 days supply | Qty: 90 | Fill #0

## 2016-03-30 MED FILL — ROSUVASTATIN CALCIUM 5 MG T: 5 | 90 days supply | Qty: 90 | Fill #1

## 2016-03-30 MED FILL — ATENOLOL/CHLORTHAL 50/25: 50-25 | 90 days supply | Qty: 90 | Fill #0

## 2016-03-30 MED FILL — metFORMIN HCL 500 MG TABS: 500 | 90 days supply | Qty: 90 | Fill #0

## 2016-03-30 NOTE — Telephone Encounter (Signed)
Wellbutrin last refilled on 04/06/15 #90 +3, last OV 01/02/16. Ok to refill?

## 2016-05-16 DIAGNOSIS — E119 Type 2 diabetes mellitus without complications: Secondary | ICD-10-CM | POA: Diagnosis not present

## 2016-05-16 DIAGNOSIS — Z7984 Long term (current) use of oral hypoglycemic drugs: Secondary | ICD-10-CM | POA: Diagnosis not present

## 2016-05-16 DIAGNOSIS — H1789 Other corneal scars and opacities: Secondary | ICD-10-CM | POA: Diagnosis not present

## 2016-05-16 DIAGNOSIS — H52221 Regular astigmatism, right eye: Secondary | ICD-10-CM | POA: Diagnosis not present

## 2016-05-16 DIAGNOSIS — H5211 Myopia, right eye: Secondary | ICD-10-CM | POA: Diagnosis not present

## 2016-07-02 MED FILL — ATENOLOL/CHLORTHAL 50/25: 50-25 | 90 days supply | Qty: 90 | Fill #1 | Status: TO

## 2016-07-02 MED FILL — metFORMIN HCL 500 MG TABS: 500 | 90 days supply | Qty: 90 | Fill #1 | Status: TO

## 2016-07-02 MED FILL — BUPROPION HCL XL 150 MG TAB: 150 | 90 days supply | Qty: 90 | Fill #1 | Status: TO

## 2016-07-02 MED FILL — ROSUVASTATIN CALCIUM 5 MG T: 5 | 90 days supply | Qty: 90 | Fill #2 | Status: TO

## 2017-01-07 ENCOUNTER — Encounter: Payer: Self-pay | Admitting: Family Medicine

## 2017-01-07 DIAGNOSIS — E559 Vitamin D deficiency, unspecified: Secondary | ICD-10-CM

## 2017-01-07 DIAGNOSIS — E785 Hyperlipidemia, unspecified: Secondary | ICD-10-CM

## 2017-01-11 ENCOUNTER — Encounter: Payer: Self-pay | Admitting: Family Medicine

## 2017-01-11 MED ORDER — ROSUVASTATIN CALCIUM 5 MG PO TABS: 5.0000 mg | ORAL_TABLET | Freq: Every day | ORAL | 0 refills | Status: DC

## 2017-01-16 ENCOUNTER — Other Ambulatory Visit: Payer: 59

## 2017-01-16 DIAGNOSIS — E785 Hyperlipidemia, unspecified: Secondary | ICD-10-CM | POA: Diagnosis not present

## 2017-01-16 DIAGNOSIS — E559 Vitamin D deficiency, unspecified: Secondary | ICD-10-CM

## 2017-01-16 LAB — TSH: TSH: 2.09 mIU/L

## 2017-01-16 NOTE — Addendum Note (Signed)
Addended by: Ellamae Sia on: 01/16/2017 09:46 AM   Modules accepted: Orders

## 2017-01-17 LAB — LIPID PANEL
Cholesterol: 178 mg/dL (ref <200)
HDL: 44 mg/dL — ABNORMAL LOW (ref >50)
LDL Cholesterol: 91 mg/dL (ref <100)
Total CHOL/HDL Ratio: 4 Ratio (ref <5.0)
Triglycerides: 215 mg/dL — ABNORMAL HIGH (ref <150)
VLDL: 43 mg/dL — ABNORMAL HIGH (ref <30)

## 2017-01-17 LAB — COMPREHENSIVE METABOLIC PANEL
ALT: 31 U/L — ABNORMAL HIGH (ref 6–29)
AST: 31 U/L (ref 10–35)
Albumin: 4 g/dL (ref 3.6–5.1)
Alkaline Phosphatase: 98 U/L (ref 33–130)
BUN: 16 mg/dL (ref 7–25)
CO2: 29 mmol/L (ref 20–32)
Calcium: 9.6 mg/dL (ref 8.6–10.4)
Chloride: 96 mmol/L — ABNORMAL LOW (ref 98–110)
Creat: 0.59 mg/dL (ref 0.50–1.05)
Glucose, Bld: 175 mg/dL — ABNORMAL HIGH (ref 65–99)
Potassium: 4.1 mmol/L (ref 3.5–5.3)
Sodium: 140 mmol/L (ref 135–146)
Total Bilirubin: 0.5 mg/dL (ref 0.2–1.2)
Total Protein: 6.6 g/dL (ref 6.1–8.1)

## 2017-01-17 LAB — VITAMIN D 25 HYDROXY (VIT D DEFICIENCY, FRACTURES): Vit D, 25-Hydroxy: 38 ng/mL (ref 30–100)

## 2017-01-17 LAB — HEMOGLOBIN A1C
Hgb A1c MFr Bld: 7.2 % — ABNORMAL HIGH (ref <5.7)
Mean Plasma Glucose: 160 mg/dL

## 2017-01-23 ENCOUNTER — Encounter: Payer: Self-pay | Admitting: Family Medicine

## 2017-01-23 ENCOUNTER — Ambulatory Visit (INDEPENDENT_AMBULATORY_CARE_PROVIDER_SITE_OTHER): Payer: 59 | Admitting: Family Medicine

## 2017-01-23 VITALS — BP 118/58 | HR 83 | Temp 98.3°F | Resp 16 | Ht 69.5 in | Wt 247.1 lb

## 2017-01-23 DIAGNOSIS — E785 Hyperlipidemia, unspecified: Secondary | ICD-10-CM

## 2017-01-23 DIAGNOSIS — Z01419 Encounter for gynecological examination (general) (routine) without abnormal findings: Secondary | ICD-10-CM

## 2017-01-23 DIAGNOSIS — I10 Essential (primary) hypertension: Secondary | ICD-10-CM

## 2017-01-23 DIAGNOSIS — E119 Type 2 diabetes mellitus without complications: Secondary | ICD-10-CM

## 2017-01-23 NOTE — Assessment & Plan Note (Signed)
Deteriorated. She would like to work on dietary changes before changes dose of her metformin.  She is motivated to change diet. Follow up in 3 months.

## 2017-01-23 NOTE — Assessment & Plan Note (Signed)
At goal for diabetic. 

## 2017-01-23 NOTE — Progress Notes (Addendum)
Subjective:   Patient ID: Hannah Giles, female    DOB: Sep 04, 1960, 56 y.o.   MRN: [id number]  Hannah Giles is a pleasant 56 y.o. year old female who presents to clinic today with Annual Exam  on 01/23/2017  HPI:  Colonoscopy 04/23/14  Has [facility]- Dr. Helane Rima Mammogram- last year per patient  pap smear done last fall as well.   Diabetes- diagnosed in 10/2014.  a1c has deteriorated quite a bit this month.   She admits to not eating well- eating out a lot, eating a lot of sweets.   Denies any episodes of hypoglycemia.  Lab Results  Component Value Date   HGBA1C 7.2 (H) 01/16/2017   Lab Results  Component Value Date   CHOL 178 01/16/2017   HDL 44 (L) 01/16/2017   LDLCALC 91 01/16/2017   LDLDIRECT 169.0 01/02/2016   TRIG 215 (H) 01/16/2017   CHOLHDL 4.0 01/16/2017   Lab Results  Component Value Date   NA 140 01/16/2017   K 4.1 01/16/2017   CL 96 (L) 01/16/2017   CO2 29 01/16/2017   Lab Results  Component Value Date   ALT 31 (H) 01/16/2017   AST 31 01/16/2017   ALKPHOS 98 01/16/2017   BILITOT 0.5 01/16/2017   Lab Results  Component Value Date   WBC 9.0 04/01/2015   HGB 13.3 04/01/2015   HCT 40.1 04/01/2015   MCV 86.2 04/01/2015   PLT 215 04/01/2015   Lab Results  Component Value Date   TSH 2.09 01/16/2017   Current Outpatient Prescriptions on File Prior to Visit  Medication Sig Dispense Refill  . atenolol-chlorthalidone (TENORETIC) 50-25 MG tablet TAKE 1 TABLET BY MOUTH DAILY. 90 tablet 3  . buPROPion (WELLBUTRIN XL) 150 MG 24 hr tablet TAKE 1 TABLET BY MOUTH ONCE DAILY 90 tablet 3  . metFORMIN (GLUCOPHAGE) 500 MG tablet TAKE 1 TABLET BY MOUTH ONCE DAILY WITH BREAKFAST. 90 tablet 3  . polyethylene glycol (MIRALAX / GLYCOLAX) packet Take 17 g by mouth daily.    . rosuvastatin (CRESTOR) 5 MG tablet Take 1 tablet (5 mg total) by mouth daily. 30 tablet 0   No current facility-administered medications on file prior to visit.     Allergies  Allergen Reactions    . Niacin     REACTION: flushing, SOB face numb    Past Medical History:  Diagnosis Date  . Hyperlipidemia   . Hypertension   . Urinary incontinence     Past Surgical History:  Procedure Laterality Date  . APPENDECTOMY    . BACK SURGERY    . COLONOSCOPY    . removal right ovary and tube    . TEMPOROMANDIBULAR JOINT SURGERY    . TONSILLECTOMY      Family History  Problem Relation Age of Onset  . Cancer Mother        colon  . Colon cancer Mother   . Heart disease Father        s/p stents in the 60's  . Dementia Father   . Diabetes Paternal Uncle   . Hypertension Sister   . Pancreatic cancer Paternal Grandfather   . Rectal cancer Neg Hx   . Stomach cancer Neg Hx     Social History   Social History  . Marital status: Married    Spouse name: N/A  . Number of children: N/A  . Years of education: N/A   Occupational History  . RN, [place] History Main Topics  .  Smoking status: Former Smoker    Packs/day: 0.50    Years: 13.00    Types: Cigarettes    Quit date: 06/04/1989  . Smokeless tobacco: Never Used  . Alcohol use 1.8 - 2.4 oz/week    3 - 4 Glasses of wine per week     Comment: socially  . Drug use: No  . Sexual activity: Not on file   Other Topics Concern  . Not on file   Social History Narrative  . No narrative on file   The PMH, PSH, Social History, Family History, Medications, and allergies have been reviewed in [facility], and have been updated if relevant.     Review of Systems  Constitutional: Negative.   HENT: Negative.   Eyes: Negative.   Respiratory: Negative.   Cardiovascular: Negative.   Gastrointestinal: Negative.   Endocrine: Negative.   Genitourinary: Negative.   Musculoskeletal: Negative.   Allergic/Immunologic: Negative.   Neurological: Negative.   Hematological: Negative.   Psychiatric/Behavioral: Negative.   All other systems reviewed and are negative.      Objective:    BP (!) 118/58   Pulse 83    Temp 98.3 F (36.8 C) (Oral)   Resp 16   Ht 5' 9.5" (1.765 m)   Wt 247 lb 1.9 oz (112.1 kg)   SpO2 97%   BMI 35.97 kg/m    Physical Exam    General:  Well-developed,well-nourished,in no acute distress; alert,appropriate and cooperative throughout examination Head:  normocephalic and atraumatic.   Eyes:  vision grossly intact, PERRL Ears:  R ear normal and L ear normal externally, TMs clear bilaterally Nose:  no external deformity.   Mouth:  good dentition.   Neck:  No deformities, masses, or tenderness noted. Breasts:  No mass, nodules, thickening, tenderness, bulging, retraction, inflamation, nipple discharge or skin changes noted.   Lungs:  Normal respiratory effort, chest expands symmetrically. Lungs are clear to auscultation, no crackles or wheezes. Heart:  Normal rate and regular rhythm. S1 and S2 normal without gallop, murmur, click, rub or other extra sounds. Abdomen:  Bowel sounds positive,abdomen soft and non-tender without masses, organomegaly or hernias noted. Msk:  No deformity or scoliosis noted of thoracic or lumbar spine.   Extremities:  No clubbing, cyanosis, edema, or deformity noted with normal full range of motion of all joints.   Neurologic:  alert & oriented X3 and gait normal.   Skin:  Intact without suspicious lesions or rashes Cervical Nodes:  No lymphadenopathy noted Axillary Nodes:  No palpable lymphadenopathy Psych:  Cognition and judgment appear intact. Alert and cooperative with normal attention span and concentration. No apparent delusions, illusions, hallucinations      Assessment & Plan:   Well woman exam  Hyperlipidemia, unspecified hyperlipidemia type  Essential hypertension  Diabetes mellitus, new onset ([place]) No Follow-up on file.

## 2017-01-23 NOTE — Patient Instructions (Signed)
Great to see you.  Please work on diet as we discussed today- follow up blood work in 3 months.

## 2017-01-23 NOTE — Assessment & Plan Note (Signed)
Reviewed preventive care protocols, scheduled due services, and updated immunizations Discussed nutrition, exercise, diet, and healthy lifestyle.  

## 2017-02-08 ENCOUNTER — Other Ambulatory Visit: Payer: Self-pay | Admitting: Family Medicine

## 2017-03-10 ENCOUNTER — Encounter: Payer: Self-pay | Admitting: Family Medicine

## 2017-04-18 ENCOUNTER — Ambulatory Visit: Payer: Self-pay | Admitting: Physician Assistant

## 2017-04-18 ENCOUNTER — Encounter: Payer: Self-pay | Admitting: Physician Assistant

## 2017-04-18 VITALS — BP 148/90 | HR 78 | Temp 97.9°F

## 2017-04-18 DIAGNOSIS — T148XXA Other injury of unspecified body region, initial encounter: Secondary | ICD-10-CM

## 2017-04-18 NOTE — Progress Notes (Signed)
S: Patient states she saw blood on her underwear, states it is in the inguinal area, she can't see it well, not sure if it's infected. Denies fever or chills. Denies any pain in the area denies any known trauma to the area  O: Vitals are normal, no acute distress, skin in the right inguinal area has a small open area that has stopped bleeding, most likely an ingrown hair, no redness or pus noted in the area, and her neurovascular is intact  A: open wound  P: reassurance, return if worsening

## 2017-04-23 ENCOUNTER — Ambulatory Visit: Payer: 59 | Admitting: Internal Medicine

## 2017-05-01 ENCOUNTER — Other Ambulatory Visit (INDEPENDENT_AMBULATORY_CARE_PROVIDER_SITE_OTHER): Payer: 59

## 2017-05-01 DIAGNOSIS — E119 Type 2 diabetes mellitus without complications: Secondary | ICD-10-CM | POA: Diagnosis not present

## 2017-05-01 LAB — HEMOGLOBIN A1C: Hgb A1c MFr Bld: 7.4 % — ABNORMAL HIGH (ref 4.6–6.5)

## 2017-05-03 ENCOUNTER — Encounter: Payer: Self-pay | Admitting: Internal Medicine

## 2017-05-08 DIAGNOSIS — H52223 Regular astigmatism, bilateral: Secondary | ICD-10-CM | POA: Diagnosis not present

## 2017-05-08 DIAGNOSIS — H524 Presbyopia: Secondary | ICD-10-CM | POA: Diagnosis not present

## 2017-05-08 DIAGNOSIS — H5202 Hypermetropia, left eye: Secondary | ICD-10-CM | POA: Diagnosis not present

## 2017-05-08 DIAGNOSIS — E119 Type 2 diabetes mellitus without complications: Secondary | ICD-10-CM | POA: Diagnosis not present

## 2017-05-08 DIAGNOSIS — H5211 Myopia, right eye: Secondary | ICD-10-CM | POA: Diagnosis not present

## 2017-05-15 ENCOUNTER — Encounter: Payer: Self-pay | Admitting: Internal Medicine

## 2017-05-15 ENCOUNTER — Ambulatory Visit (INDEPENDENT_AMBULATORY_CARE_PROVIDER_SITE_OTHER): Payer: 59 | Admitting: Internal Medicine

## 2017-05-15 VITALS — BP 122/80 | HR 76 | Temp 98.2°F | Wt 246.0 lb

## 2017-05-15 DIAGNOSIS — F4323 Adjustment disorder with mixed anxiety and depressed mood: Secondary | ICD-10-CM | POA: Diagnosis not present

## 2017-05-15 DIAGNOSIS — I10 Essential (primary) hypertension: Secondary | ICD-10-CM | POA: Diagnosis not present

## 2017-05-15 DIAGNOSIS — E119 Type 2 diabetes mellitus without complications: Secondary | ICD-10-CM

## 2017-05-15 DIAGNOSIS — E7800 Pure hypercholesterolemia, unspecified: Secondary | ICD-10-CM

## 2017-05-15 NOTE — Assessment & Plan Note (Signed)
Controlled on Atenolol-Chlorthalidone CMET reviewed Will monitor

## 2017-05-15 NOTE — Assessment & Plan Note (Signed)
Discussed trying to wean off the Wellbutrin after the holiday's Will monitor for now

## 2017-05-15 NOTE — Assessment & Plan Note (Addendum)
Encouraged her to consume a low carb diet and exercise for weight loss She does not want to increase Metformin to 500 mg BID, but she wants to try 3 months of lifestyle changes first, repeat A1C ordered Advised her to continue Metformin daily for now Foot exam today Will request records of eye exam Flu and pneumovax UTD

## 2017-05-15 NOTE — Assessment & Plan Note (Signed)
Encouraged her to consume a low fat diet  Continue Crestor for now Will monitor

## 2017-05-15 NOTE — Patient Instructions (Signed)

## 2017-05-15 NOTE — Progress Notes (Signed)
HPI  Pt presents to the clinic today to establish care and for management of the conditions listed below. She is transferring care from Dr. Deborra Medina.  HTN: Her BP today is 122/80. She is taking Atenolol-Chlorthalidone as prescribed. There is no ECG on file.  HLD: She denies myalgias on Crestor. She does not consume a low fat diet.  Depression: She feels like this was situational after her father's death. She has not tried to come off the Wellbutrin, but reports she may be interested in this after the holidays. She denies SI/HI.  DM 2: Her last A1C was 7.4%, 04/2017. She is taking Metformin as prescribed. She denies GI side effects. She does not check her sugars or her feet on a routine basis. Her last eye exam was 05/2017.  Flu: 02/2017 Tetanus: 5/23011 Mammogram: 06/2015 Pap Smear: 06/2015 Colon Screening: 04/2014 Vision Screening: 05/2017, [facility] Dentist: biannually  Past Medical History:  Diagnosis Date  . Hyperlipidemia   . Hypertension   . Urinary incontinence     Current Outpatient Medications  Medication Sig Dispense Refill  . atenolol-chlorthalidone (TENORETIC) 50-25 MG tablet TAKE 1 TABLET BY MOUTH ONCE DAILY 90 tablet 1  . buPROPion (WELLBUTRIN XL) 150 MG 24 hr tablet TAKE 1 TABLET BY MOUTH ONCE DAILY 90 tablet 1  . metFORMIN (GLUCOPHAGE) 500 MG tablet TAKE 1 TABLET BY MOUTH ONCE DAILY WITH BREAKFAST. 90 tablet 1  . polyethylene glycol (MIRALAX / GLYCOLAX) packet Take 17 g by mouth daily.    . rosuvastatin (CRESTOR) 5 MG tablet TAKE 1 TABLET (5 MG TOTAL) BY MOUTH DAILY. 90 tablet 1   No current facility-administered medications for this visit.     Allergies  Allergen Reactions  . Niacin     REACTION: flushing, SOB face numb    Family History  Problem Relation Age of Onset  . Cancer Mother        colon  . Colon cancer Mother   . Heart disease Father        s/p stents in the 60's  . Dementia Father   . Diabetes Paternal Uncle   . Hypertension Sister   .  Pancreatic cancer Paternal Grandfather   . Rectal cancer Neg Hx   . Stomach cancer Neg Hx     Social History   Socioeconomic History  . Marital status: Married    Spouse name: Not on file  . Number of children: Not on file  . Years of education: Not on file  . Highest education level: Not on file  Social Needs  . Financial resource strain: Not on file  . Food insecurity - worry: Not on file  . Food insecurity - inability: Not on file  . Transportation needs - medical: Not on file  . Transportation needs - non-medical: Not on file  Occupational History  . Occupation: Therapist, sports, [employer] manager  Tobacco Use  . Smoking status: Former Smoker    Packs/day: 0.50    Years: 13.00    Pack years: 6.50    Types: Cigarettes    Last attempt to quit: 06/04/1989    Years since quitting: 27.9  . Smokeless tobacco: Never Used  Substance and Sexual Activity  . Alcohol use: Yes    Alcohol/week: 1.8 - 2.4 oz    Types: 3 - 4 Glasses of wine per week    Comment: socially  . Drug use: No  . Sexual activity: Not on file  Other Topics Concern  . Not on file  Social  History Narrative  . Not on file    ROS:  Constitutional: Denies fever, malaise, fatigue, headache or abrupt weight changes.  Respiratory: Denies difficulty breathing, shortness of breath, cough or sputum production.   Cardiovascular: Denies chest pain, chest tightness, palpitations or swelling in the hands or feet.  Gastrointestinal: Denies abdominal pain, bloating, constipation, diarrhea or blood in the stool.  Musculoskeletal: Denies decrease in range of motion, difficulty with gait, muscle pain or joint pain and swelling.  Skin: Denies redness, rashes, lesions or ulcercations.  Neurological: Denies dizziness, difficulty with memory, difficulty with speech or problems with balance and coordination.  Psych: Pt has a history of depression. Denies anxiety, SI/HI.  No other specific complaints in a complete review of systems (except  as listed in HPI above).  PE:  BP 122/80   Pulse 76   Temp 98.2 F (36.8 C) (Oral)   Wt 246 lb (111.6 kg)   SpO2 97%   BMI 35.81 kg/m  Wt Readings from Last 3 Encounters:  05/15/17 246 lb (111.6 kg)  01/23/17 247 lb 1.9 oz (112.1 kg)  01/02/16 234 lb 4 oz (106.3 kg)    General: Appears her stated age, obese in NAD. Skin: No ulcerations noted.  Cardiovascular: Normal rate and rhythm. S1,S2 noted.  No murmur, rubs or gallops noted. No JVD or BLE edema.  Pulmonary/Chest: Normal effort and positive vesicular breath sounds. No respiratory distress. No wheezes, rales or ronchi noted.  Abdomen: Soft and nontender. Normal bowel sounds. Neurological: Alert and oriented.  Psychiatric: Mood and affect normal. Behavior is normal. Judgment and thought content normal.    BMET    Component Value Date/Time   NA 140 01/16/2017 0945   NA 138 09/01/2013 0017   K 4.1 01/16/2017 0945   K 3.0 (L) 09/01/2013 0017   CL 96 (L) 01/16/2017 0945   CL 100 09/01/2013 0017   CO2 29 01/16/2017 0945   CO2 33 (H) 09/01/2013 0017   GLUCOSE 175 (H) 01/16/2017 0945   GLUCOSE 208 (H) 09/01/2013 0017   BUN 16 01/16/2017 0945   BUN 18 09/01/2013 0017   CREATININE 0.59 01/16/2017 0945   CALCIUM 9.6 01/16/2017 0945   CALCIUM 9.3 09/01/2013 0017   GFRNONAA >60 09/01/2013 0017   GFRAA >60 09/01/2013 0017    Lipid Panel     Component Value Date/Time   CHOL 178 01/16/2017 0945   TRIG 215 (H) 01/16/2017 0945   HDL 44 (L) 01/16/2017 0945   CHOLHDL 4.0 01/16/2017 0945   VLDL 43 (H) 01/16/2017 0945   LDLCALC 91 01/16/2017 0945    CBC    Component Value Date/Time   WBC 9.0 04/01/2015 0738   RBC 4.65 04/01/2015 0738   HGB 13.3 04/01/2015 0738   HGB 13.8 09/01/2013 0017   HCT 40.1 04/01/2015 0738   HCT 40.9 09/01/2013 0017   PLT 215 04/01/2015 0738   PLT 216 09/01/2013 0017   MCV 86.2 04/01/2015 0738   MCV 85 09/01/2013 0017   MCH 28.6 04/01/2015 0738   MCHC 33.2 04/01/2015 0738   RDW 14.0  04/01/2015 0738   RDW 13.9 09/01/2013 0017   LYMPHSABS 2.3 04/01/2015 0738   MONOABS 0.3 04/01/2015 0738   EOSABS 0.1 04/01/2015 0738   BASOSABS 0.0 04/01/2015 0738    Hgb A1C Lab Results  Component Value Date   HGBA1C 7.4 (H) 05/01/2017     Assessment and Plan:

## 2017-06-12 DIAGNOSIS — Z01419 Encounter for gynecological examination (general) (routine) without abnormal findings: Secondary | ICD-10-CM | POA: Diagnosis not present

## 2017-06-12 DIAGNOSIS — Z6835 Body mass index (BMI) 35.0-35.9, adult: Secondary | ICD-10-CM | POA: Diagnosis not present

## 2017-06-12 DIAGNOSIS — Z1231 Encounter for screening mammogram for malignant neoplasm of breast: Secondary | ICD-10-CM | POA: Diagnosis not present

## 2017-07-21 ENCOUNTER — Encounter: Payer: Self-pay | Admitting: Internal Medicine

## 2017-07-24 ENCOUNTER — Encounter: Payer: Self-pay | Admitting: Internal Medicine

## 2017-07-24 ENCOUNTER — Ambulatory Visit (INDEPENDENT_AMBULATORY_CARE_PROVIDER_SITE_OTHER): Payer: 59 | Admitting: Internal Medicine

## 2017-07-24 VITALS — BP 136/84 | HR 77 | Temp 98.7°F | Wt 251.0 lb

## 2017-07-24 DIAGNOSIS — L0291 Cutaneous abscess, unspecified: Secondary | ICD-10-CM

## 2017-07-24 MED ORDER — SULFAMETHOXAZOLE-TRIMETHOPRIM 800-160 MG PO TABS: 1.0000 | ORAL_TABLET | Freq: Two times a day (BID) | ORAL | 0 refills | Status: DC

## 2017-07-24 NOTE — Progress Notes (Signed)
Subjective:    Patient ID: Hannah Giles, female    DOB: 03-17-1961, 57 y.o.   MRN: [id number]  HPI  Pt presents to the clinic today with c/o a lump under her right breast. She noticed this 1-2 weeks ago. The area is hard and painful when touched. It is a little red, but she denies warmth or drainage. She has not tried anything OTC for her symptoms.  Review of Systems      Past Medical History:  Diagnosis Date  . Hyperlipidemia   . Hypertension   . Urinary incontinence     Current Outpatient Medications  Medication Sig Dispense Refill  . atenolol-chlorthalidone (TENORETIC) 50-25 MG tablet TAKE 1 TABLET BY MOUTH ONCE DAILY 90 tablet 1  . buPROPion (WELLBUTRIN XL) 150 MG 24 hr tablet TAKE 1 TABLET BY MOUTH ONCE DAILY 90 tablet 1  . metFORMIN (GLUCOPHAGE) 500 MG tablet TAKE 1 TABLET BY MOUTH ONCE DAILY WITH BREAKFAST. 90 tablet 1  . polyethylene glycol (MIRALAX / GLYCOLAX) packet Take 17 g by mouth daily.    . rosuvastatin (CRESTOR) 5 MG tablet TAKE 1 TABLET (5 MG TOTAL) BY MOUTH DAILY. 90 tablet 1  . sulfamethoxazole-trimethoprim (BACTRIM DS,SEPTRA DS) 800-160 MG tablet Take 1 tablet by mouth 2 (two) times daily. 14 tablet 0   No current facility-administered medications for this visit.     Allergies  Allergen Reactions  . Niacin     REACTION: flushing, SOB face numb    Family History  Problem Relation Age of Onset  . Cancer Mother        colon  . Colon cancer Mother   . Heart disease Father        s/p stents in the 60's  . Dementia Father   . Diabetes Paternal Uncle   . Hypertension Sister   . Pancreatic cancer Paternal Grandfather   . Rectal cancer Neg Hx   . Stomach cancer Neg Hx     Social History   Socioeconomic History  . Marital status: Married    Spouse name: Not on file  . Number of children: Not on file  . Years of education: Not on file  . Highest education level: Not on file  Social Needs  . Financial resource strain: Not on file  . Food  insecurity - worry: Not on file  . Food insecurity - inability: Not on file  . Transportation needs - medical: Not on file  . Transportation needs - non-medical: Not on file  Occupational History  . Occupation: Therapist, sports, Theme park manager  Tobacco Use  . Smoking status: Former Smoker    Packs/day: 0.50    Years: 13.00    Pack years: 6.50    Types: Cigarettes    Last attempt to quit: 06/04/1989    Years since quitting: 28.1  . Smokeless tobacco: Never Used  Substance and Sexual Activity  . Alcohol use: Yes    Alcohol/week: 1.8 - 2.4 oz    Types: 3 - 4 Glasses of wine per week    Comment: socially  . Drug use: No  . Sexual activity: Not on file  Other Topics Concern  . Not on file  Social History Narrative  . Not on file     Constitutional: Denies fever, malaise, fatigue, headache or abrupt weight changes.  Skin: Pt reports lump under right breast. Denies ulcercations.    No other specific complaints in a complete review of systems (except as listed in HPI above).  Objective:  Physical Exam BP 136/84   Pulse 77   Temp 98.7 F (37.1 C) (Oral)   Wt 251 lb (113.9 kg)   SpO2 98%   BMI 36.53 kg/m  Wt Readings from Last 3 Encounters:  07/24/17 251 lb (113.9 kg)  05/15/17 246 lb (111.6 kg)  01/23/17 247 lb 1.9 oz (112.1 kg)    General: Appears their stated age, well developed, well nourished in NAD. Skin: 1 cm hard abscess noted under right breast, no erythema. Area non fluctuant.  BMET    Component Value Date/Time   NA 140 01/16/2017 0945   NA 138 09/01/2013 0017   K 4.1 01/16/2017 0945   K 3.0 (L) 09/01/2013 0017   CL 96 (L) 01/16/2017 0945   CL 100 09/01/2013 0017   CO2 29 01/16/2017 0945   CO2 33 (H) 09/01/2013 0017   GLUCOSE 175 (H) 01/16/2017 0945   GLUCOSE 208 (H) 09/01/2013 0017   BUN 16 01/16/2017 0945   BUN 18 09/01/2013 0017   CREATININE 0.59 01/16/2017 0945   CALCIUM 9.6 01/16/2017 0945   CALCIUM 9.3 09/01/2013 0017   GFRNONAA >60 09/01/2013 0017    GFRAA >60 09/01/2013 0017    Lipid Panel     Component Value Date/Time   CHOL 178 01/16/2017 0945   TRIG 215 (H) 01/16/2017 0945   HDL 44 (L) 01/16/2017 0945   CHOLHDL 4.0 01/16/2017 0945   VLDL 43 (H) 01/16/2017 0945   LDLCALC 91 01/16/2017 0945    CBC    Component Value Date/Time   WBC 9.0 04/01/2015 0738   RBC 4.65 04/01/2015 0738   HGB 13.3 04/01/2015 0738   HGB 13.8 09/01/2013 0017   HCT 40.1 04/01/2015 0738   HCT 40.9 09/01/2013 0017   PLT 215 04/01/2015 0738   PLT 216 09/01/2013 0017   MCV 86.2 04/01/2015 0738   MCV 85 09/01/2013 0017   MCH 28.6 04/01/2015 0738   MCHC 33.2 04/01/2015 0738   RDW 14.0 04/01/2015 0738   RDW 13.9 09/01/2013 0017   LYMPHSABS 2.3 04/01/2015 0738   MONOABS 0.3 04/01/2015 0738   EOSABS 0.1 04/01/2015 0738   BASOSABS 0.0 04/01/2015 0738    Hgb A1C Lab Results  Component Value Date   HGBA1C 7.4 (H) 05/01/2017             Assessment & Plan:   Abscess:  Warm compresses TID eRx for Septra BID x 7 days  Return precautions discussed Webb Silversmith, NP

## 2017-07-24 NOTE — Patient Instructions (Signed)
Skin Abscess A skin abscess is an infected area on or under your skin that contains pus and other material. An abscess can happen almost anywhere on your body. Some abscesses break open (rupture) on their own. Most continue to get worse unless they are treated. The infection can spread deeper into the body and into your blood, which can make you feel sick. Treatment usually involves draining the abscess. Follow these instructions at home: Abscess Care  If you have an abscess that has not drained, place a warm, clean, wet washcloth over the abscess several times a day. Do this as told by your doctor.  Follow instructions from your doctor about how to take care of your abscess. Make sure you: ? Cover the abscess with a bandage (dressing). ? Change your bandage or gauze as told by your doctor. ? Wash your hands with soap and water before you change the bandage or gauze. If you cannot use soap and water, use hand sanitizer.  Check your abscess every day for signs that the infection is getting worse. Check for: ? More redness, swelling, or pain. ? More fluid or blood. ? Warmth. ? More pus or a bad smell. Medicines   Take over-the-counter and prescription medicines only as told by your doctor.  If you were prescribed an antibiotic medicine, take it as told by your doctor. Do not stop taking the antibiotic even if you start to feel better. General instructions  To avoid spreading the infection: ? Do not share personal care items, towels, or hot tubs with others. ? Avoid making skin-to-skin contact with other people.  Keep all follow-up visits as told by your doctor. This is important. Contact a doctor if:  You have more redness, swelling, or pain around your abscess.  You have more fluid or blood coming from your abscess.  Your abscess feels warm when you touch it.  You have more pus or a bad smell coming from your abscess.  You have a fever.  Your muscles ache.  You have  chills.  You feel sick. Get help right away if:  You have very bad (severe) pain.  You see red streaks on your skin spreading away from the abscess. This information is not intended to replace advice given to you by your health care provider. Make sure you discuss any questions you have with your health care provider. Document Released: 11/07/2007 Document Revised: 01/15/2016 Document Reviewed: 03/30/2015 Elsevier Interactive Patient Education  2018 [employer]  

## 2017-08-13 ENCOUNTER — Other Ambulatory Visit: Payer: Self-pay | Admitting: Family Medicine

## 2017-08-14 ENCOUNTER — Other Ambulatory Visit (INDEPENDENT_AMBULATORY_CARE_PROVIDER_SITE_OTHER): Payer: 59

## 2017-08-14 DIAGNOSIS — E119 Type 2 diabetes mellitus without complications: Secondary | ICD-10-CM

## 2017-08-14 LAB — HEMOGLOBIN A1C: Hgb A1c MFr Bld: 8.4 % — ABNORMAL HIGH (ref 4.6–6.5)

## 2017-08-15 ENCOUNTER — Encounter: Payer: Self-pay | Admitting: Internal Medicine

## 2017-08-16 MED ORDER — METFORMIN HCL 1000 MG PO TABS: 1000.0000 mg | ORAL_TABLET | Freq: Two times a day (BID) | ORAL | 0 refills | Status: DC

## 2017-08-16 MED ORDER — BUPROPION HCL ER (XL) 150 MG PO TB24: 150.0000 mg | ORAL_TABLET | Freq: Every day | ORAL | 1 refills | Status: DC

## 2017-08-16 MED ORDER — ATENOLOL-CHLORTHALIDONE 50-25 MG PO TABS: 1.0000 | ORAL_TABLET | Freq: Every day | ORAL | 1 refills | Status: DC

## 2017-08-16 MED ORDER — GLUCOSE BLOOD VI STRP: 1.0000 | ORAL_STRIP | Freq: Two times a day (BID) | 12 refills | Status: DC

## 2017-08-16 NOTE — Addendum Note (Signed)
Addended by: Lurlean Nanny on: 08/16/2017 09:19 AM   Modules accepted: Orders

## 2017-08-16 NOTE — Addendum Note (Signed)
Addended by: Lurlean Nanny on: 08/16/2017 10:04 AM   Modules accepted: Orders

## 2017-11-04 ENCOUNTER — Encounter: Payer: Self-pay | Admitting: Internal Medicine

## 2017-11-06 ENCOUNTER — Ambulatory Visit (INDEPENDENT_AMBULATORY_CARE_PROVIDER_SITE_OTHER): Payer: 59 | Admitting: Internal Medicine

## 2017-11-06 ENCOUNTER — Encounter: Payer: Self-pay | Admitting: Internal Medicine

## 2017-11-06 VITALS — BP 132/84 | HR 82 | Temp 98.4°F | Wt 244.0 lb

## 2017-11-06 DIAGNOSIS — E119 Type 2 diabetes mellitus without complications: Secondary | ICD-10-CM | POA: Diagnosis not present

## 2017-11-06 DIAGNOSIS — R5383 Other fatigue: Secondary | ICD-10-CM | POA: Diagnosis not present

## 2017-11-06 MED ORDER — GLIPIZIDE 5 MG PO TABS: 5.0000 mg | ORAL_TABLET | Freq: Two times a day (BID) | ORAL | 3 refills | Status: DC

## 2017-11-06 NOTE — Progress Notes (Signed)
Subjective:    Patient ID: Hannah Giles, female    DOB: 03-20-1961, 57 y.o.   MRN: [id number]  HPI  Pt presents to the clinic today with c/o abdominal discomfort. She reports this started shortly after increasing her Metformin to 1000 mg BID. She reports associated fatigue, lightheadedness and belching. She denies syncope, nausea, vomiting or diarrhea. She reports she has not taken the Metformin in 2 days and her symptoms have essentially resolved. She denies recent changes in diet or other medications. She is not checking her sugars. She does not consume a low carb diet. She does not exercise routinely. She checks her feet daily. She gets an eye exam routinely.  Review of Systems      Past Medical History:  Diagnosis Date  . Hyperlipidemia   . Hypertension   . Urinary incontinence     Current Outpatient Medications  Medication Sig Dispense Refill  . atenolol-chlorthalidone (TENORETIC) 50-25 MG tablet Take 1 tablet by mouth daily. 90 tablet 1  . buPROPion (WELLBUTRIN XL) 150 MG 24 hr tablet Take 1 tablet (150 mg total) by mouth daily. 90 tablet 1  . glucose blood (FREESTYLE LITE) test strip 1 each by Other route 2 (two) times daily. Use as instructed 100 each 12  . metFORMIN (GLUCOPHAGE) 1000 MG tablet Take 1 tablet (1,000 mg total) by mouth 2 (two) times daily with a meal. 180 tablet 0  . polyethylene glycol (MIRALAX / GLYCOLAX) packet Take 17 g by mouth daily.    . rosuvastatin (CRESTOR) 5 MG tablet TAKE 1 TABLET (5 MG TOTAL) BY MOUTH DAILY. 90 tablet 1  . glipiZIDE (GLUCOTROL) 5 MG tablet Take 1 tablet (5 mg total) by mouth 2 (two) times daily before a meal. 60 tablet 3   No current facility-administered medications for this visit.     Allergies  Allergen Reactions  . Niacin     REACTION: flushing, SOB face numb    Family History  Problem Relation Age of Onset  . Cancer Mother        colon  . Colon cancer Mother   . Heart disease Father        s/p stents in the 60's    . Dementia Father   . Diabetes Paternal Uncle   . Hypertension Sister   . Pancreatic cancer Paternal Grandfather   . Rectal cancer Neg Hx   . Stomach cancer Neg Hx     Social History   Socioeconomic History  . Marital status: Married    Spouse name: Not on file  . Number of children: Not on file  . Years of education: Not on file  . Highest education level: Not on file  Occupational History  . Occupation: Therapist, sports, [place]  . Financial resource strain: Not on file  . Food insecurity:    Worry: Not on file    Inability: Not on file  . Transportation needs:    Medical: Not on file    Non-medical: Not on file  Tobacco Use  . Smoking status: Former Smoker    Packs/day: 0.50    Years: 13.00    Pack years: 6.50    Types: Cigarettes    Last attempt to quit: 06/04/1989    Years since quitting: 28.4  . Smokeless tobacco: Never Used  Substance and Sexual Activity  . Alcohol use: Yes    Alcohol/week: 1.8 - 2.4 oz    Types: 3 - 4 Glasses of wine per week  Comment: socially  . Drug use: No  . Sexual activity: Not on file  Lifestyle  . Physical activity:    Days per week: Not on file    Minutes per session: Not on file  . Stress: Not on file  Relationships  . Social connections:    Talks on phone: Not on file    Gets together: Not on file    Attends religious service: Not on file    Active member of club or organization: Not on file    Attends meetings of clubs or organizations: Not on file    Relationship status: Not on file  . Intimate partner violence:    Fear of current or ex partner: Not on file    Emotionally abused: Not on file    Physically abused: Not on file    Forced sexual activity: Not on file  Other Topics Concern  . Not on file  Social History Narrative  . Not on file     Constitutional: Pt reports fatigue. Denies fever, malaise, headache or abrupt weight changes.  Respiratory: Denies difficulty breathing, shortness of breath,  cough or sputum production.   Cardiovascular: Denies chest pain, chest tightness, palpitations or swelling in the hands or feet.  Gastrointestinal: Pt reports abdominal pain, belching. Denies bloating, constipation, diarrhea or blood in the stool.  GU: Denies urgency, frequency, pain with urination, burning sensation, blood in urine, odor or discharge. Neurological: Pt reports lightheadedness. Denies dizziness, difficulty with memory, difficulty with speech or problems with balance and coordination.    No other specific complaints in a complete review of systems (except as listed in HPI above).  Objective:   Physical Exam   BP 132/84   Pulse 82   Temp 98.4 F (36.9 C) (Oral)   Wt 244 lb (110.7 kg)   SpO2 98%   BMI 35.52 kg/m  Wt Readings from Last 3 Encounters:  11/06/17 244 lb (110.7 kg)  07/24/17 251 lb (113.9 kg)  05/15/17 246 lb (111.6 kg)    General: Appears her stated age, obese in NAD. Skin: Warm, dry and intact. No ulcerations noted. Cardiovascular: Normal rate and rhythm.  Pulmonary/Chest: Normal effort and positive vesicular breath sounds. No respiratory distress. No wheezes, rales or ronchi noted.  Abdomen: Soft and nontender. Normal bowel sounds. No distention or masses noted.  Neurological: Alert and oriented. Coordination normal.    BMET    Component Value Date/Time   NA 140 01/16/2017 0945   NA 138 09/01/2013 0017   K 4.1 01/16/2017 0945   K 3.0 (L) 09/01/2013 0017   CL 96 (L) 01/16/2017 0945   CL 100 09/01/2013 0017   CO2 29 01/16/2017 0945   CO2 33 (H) 09/01/2013 0017   GLUCOSE 175 (H) 01/16/2017 0945   GLUCOSE 208 (H) 09/01/2013 0017   BUN 16 01/16/2017 0945   BUN 18 09/01/2013 0017   CREATININE 0.59 01/16/2017 0945   CALCIUM 9.6 01/16/2017 0945   CALCIUM 9.3 09/01/2013 0017   GFRNONAA >60 09/01/2013 0017   GFRAA >60 09/01/2013 0017    Lipid Panel     Component Value Date/Time   CHOL 178 01/16/2017 0945   TRIG 215 (H) 01/16/2017 0945    HDL 44 (L) 01/16/2017 0945   CHOLHDL 4.0 01/16/2017 0945   VLDL 43 (H) 01/16/2017 0945   LDLCALC 91 01/16/2017 0945    CBC    Component Value Date/Time   WBC 9.0 04/01/2015 0738   RBC 4.65 04/01/2015 0738  HGB 13.3 04/01/2015 0738   HGB 13.8 09/01/2013 0017   HCT 40.1 04/01/2015 0738   HCT 40.9 09/01/2013 0017   PLT 215 04/01/2015 0738   PLT 216 09/01/2013 0017   MCV 86.2 04/01/2015 0738   MCV 85 09/01/2013 0017   MCH 28.6 04/01/2015 0738   MCHC 33.2 04/01/2015 0738   RDW 14.0 04/01/2015 0738   RDW 13.9 09/01/2013 0017   LYMPHSABS 2.3 04/01/2015 0738   MONOABS 0.3 04/01/2015 0738   EOSABS 0.1 04/01/2015 0738   BASOSABS 0.0 04/01/2015 0738    Hgb A1C Lab Results  Component Value Date   HGBA1C 8.4 (H) 08/14/2017           Assessment & Plan:   Fatigue:  She is coming back for labs in 1 week Will check B12 and Vit D  Return precautions discussed Webb Silversmith, NP

## 2017-11-06 NOTE — Assessment & Plan Note (Signed)
Will have her return in 1 week for lab only A1C and microalbumin Encouraged her to consume a low carb diet and exercise for weight loss GI issues with Metformin, will d/c RX for Glipizide 5 mg BID  Encouraged her to monitor for hypoglycemia Encouraged yearly eye exam Pneumovax UTD Foot exam today

## 2017-11-06 NOTE — Patient Instructions (Signed)

## 2017-11-13 ENCOUNTER — Other Ambulatory Visit (INDEPENDENT_AMBULATORY_CARE_PROVIDER_SITE_OTHER): Payer: 59

## 2017-11-13 ENCOUNTER — Ambulatory Visit: Payer: 59 | Admitting: Internal Medicine

## 2017-11-13 DIAGNOSIS — E119 Type 2 diabetes mellitus without complications: Secondary | ICD-10-CM

## 2017-11-13 DIAGNOSIS — R5383 Other fatigue: Secondary | ICD-10-CM | POA: Diagnosis not present

## 2017-11-13 LAB — BASIC METABOLIC PANEL
BUN: 17 mg/dL (ref 6–23)
CO2: 33 mEq/L — ABNORMAL HIGH (ref 19–32)
Calcium: 9.8 mg/dL (ref 8.4–10.5)
Chloride: 98 mEq/L (ref 96–112)
Creatinine, Ser: 0.59 mg/dL (ref 0.40–1.20)
GFR: 111 mL/min (ref >60.00)
Glucose, Bld: 158 mg/dL — ABNORMAL HIGH (ref 70–99)
Potassium: 3.5 mEq/L (ref 3.5–5.1)
Sodium: 139 mEq/L (ref 135–145)

## 2017-11-13 LAB — MICROALBUMIN / CREATININE URINE RATIO
Creatinine,U: 142.4 mg/dL
Microalb Creat Ratio: 1 mg/g (ref 0.0–30.0)
Microalb, Ur: 1.4 mg/dL (ref 0.0–1.9)

## 2017-11-13 LAB — VITAMIN D 25 HYDROXY (VIT D DEFICIENCY, FRACTURES): VITD: 31.12 ng/mL (ref 30.00–100.00)

## 2017-11-13 LAB — HEMOGLOBIN A1C: Hgb A1c MFr Bld: 6.9 % — ABNORMAL HIGH (ref 4.6–6.5)

## 2017-11-13 LAB — VITAMIN B12: Vitamin B-12: 326 pg/mL (ref 211–911)

## 2017-11-15 ENCOUNTER — Encounter: Payer: Self-pay | Admitting: Internal Medicine

## 2017-12-08 ENCOUNTER — Encounter: Payer: Self-pay | Admitting: Internal Medicine

## 2017-12-09 MED ORDER — GLIPIZIDE 5 MG PO TABS: 5.0000 mg | ORAL_TABLET | Freq: Two times a day (BID) | ORAL | 0 refills | Status: DC

## 2017-12-18 ENCOUNTER — Encounter: Payer: 59 | Admitting: Internal Medicine

## 2018-01-22 DIAGNOSIS — L900 Lichen sclerosus et atrophicus: Secondary | ICD-10-CM | POA: Diagnosis not present

## 2018-01-22 DIAGNOSIS — N760 Acute vaginitis: Secondary | ICD-10-CM | POA: Diagnosis not present

## 2018-01-24 ENCOUNTER — Encounter: Payer: 59 | Admitting: Internal Medicine

## 2018-02-12 ENCOUNTER — Encounter: Payer: Self-pay | Admitting: Internal Medicine

## 2018-02-12 ENCOUNTER — Ambulatory Visit (INDEPENDENT_AMBULATORY_CARE_PROVIDER_SITE_OTHER): Payer: 59 | Admitting: Internal Medicine

## 2018-02-12 VITALS — BP 128/76 | HR 83 | Temp 98.3°F | Ht 69.33 in | Wt 249.0 lb

## 2018-02-12 DIAGNOSIS — E7800 Pure hypercholesterolemia, unspecified: Secondary | ICD-10-CM

## 2018-02-12 DIAGNOSIS — E559 Vitamin D deficiency, unspecified: Secondary | ICD-10-CM

## 2018-02-12 DIAGNOSIS — E119 Type 2 diabetes mellitus without complications: Secondary | ICD-10-CM | POA: Diagnosis not present

## 2018-02-12 DIAGNOSIS — I10 Essential (primary) hypertension: Secondary | ICD-10-CM

## 2018-02-12 DIAGNOSIS — Z0000 Encounter for general adult medical examination without abnormal findings: Secondary | ICD-10-CM | POA: Diagnosis not present

## 2018-02-12 DIAGNOSIS — F4323 Adjustment disorder with mixed anxiety and depressed mood: Secondary | ICD-10-CM

## 2018-02-12 NOTE — Progress Notes (Signed)
Subjective:    Patient ID: Hannah Giles, female    DOB: 06-15-1960, 57 y.o.   MRN: [id number]  HPI  Pt presents to the clinic today for her annual exam. She is also due to follow up chronic conditions.  HTN: Her BP today is 128/76.She is taking Atenolol-Chorthalidone as prescribed. There is no ECG on file.  DM 2: Her last A1C was 6.9%. She is taking Glipizide as prescribed. She does not check her sugars. She checks her feet daily. Her last eye exam was 05/2017.  HLD: Her last LDL was 91, 01/2017. She denies myalgias on Rosuvastatin. She tries to consume a low fat diet.  Anxiety and Depression: Chronic but stable on Wellbutrin. She denies SI/HI.  Flu: 02/2018 Tetanus: 10/2009 Pneumovax: 01/2015 Pap Smear: 06/2017 Mammogram: 06/2017 Colon Screening: 04/2014 Vision Screening: annually Dentist: biannually  Diet: She does eat meat. She consumes fruits and veggies daily. She tries to avoid fried foods. She drinks mostly water. Exercise: None  Review of Systems      Past Medical History:  Diagnosis Date  . Hyperlipidemia   . Hypertension   . Urinary incontinence     Current Outpatient Medications  Medication Sig Dispense Refill  . atenolol-chlorthalidone (TENORETIC) 50-25 MG tablet Take 1 tablet by mouth daily. 90 tablet 1  . buPROPion (WELLBUTRIN XL) 150 MG 24 hr tablet Take 1 tablet (150 mg total) by mouth daily. 90 tablet 1  . glipiZIDE (GLUCOTROL) 5 MG tablet Take 1 tablet (5 mg total) by mouth 2 (two) times daily before a meal. 180 tablet 0  . glucose blood (FREESTYLE LITE) test strip 1 each by Other route 2 (two) times daily. Use as instructed 100 each 12  . polyethylene glycol (MIRALAX / GLYCOLAX) packet Take 17 g by mouth daily.    . rosuvastatin (CRESTOR) 5 MG tablet TAKE 1 TABLET (5 MG TOTAL) BY MOUTH DAILY. 90 tablet 1   No current facility-administered medications for this visit.     Allergies  Allergen Reactions  . Niacin     REACTION: flushing, SOB face numb      Family History  Problem Relation Age of Onset  . Cancer Mother        colon  . Colon cancer Mother   . Heart disease Father        s/p stents in the 60's  . Dementia Father   . Diabetes Paternal Uncle   . Hypertension Sister   . Pancreatic cancer Paternal Grandfather   . Rectal cancer Neg Hx   . Stomach cancer Neg Hx     Social History   Socioeconomic History  . Marital status: Married    Spouse name: Not on file  . Number of children: Not on file  . Years of education: Not on file  . Highest education level: Not on file  Occupational History  . Occupation: Therapist, sports, [place]  . Financial resource strain: Not on file  . Food insecurity:    Worry: Not on file    Inability: Not on file  . Transportation needs:    Medical: Not on file    Non-medical: Not on file  Tobacco Use  . Smoking status: Former Smoker    Packs/day: 0.50    Years: 13.00    Pack years: 6.50    Types: Cigarettes    Last attempt to quit: 06/04/1989    Years since quitting: 28.7  . Smokeless tobacco: Never Used  Substance and Sexual  Activity  . Alcohol use: Yes    Alcohol/week: 3.0 - 4.0 standard drinks    Types: 3 - 4 Glasses of wine per week    Comment: socially  . Drug use: No  . Sexual activity: Not on file  Lifestyle  . Physical activity:    Days per week: Not on file    Minutes per session: Not on file  . Stress: Not on file  Relationships  . Social connections:    Talks on phone: Not on file    Gets together: Not on file    Attends religious service: Not on file    Active member of club or organization: Not on file    Attends meetings of clubs or organizations: Not on file    Relationship status: Not on file  . Intimate partner violence:    Fear of current or ex partner: Not on file    Emotionally abused: Not on file    Physically abused: Not on file    Forced sexual activity: Not on file  Other Topics Concern  . Not on file  Social History Narrative  .  Not on file     Constitutional: Denies fever, malaise, fatigue, headache or abrupt weight changes.  HEENT: Denies eye pain, eye redness, ear pain, ringing in the ears, wax buildup, runny nose, nasal congestion, bloody nose, or sore throat. Respiratory: Denies difficulty breathing, shortness of breath, cough or sputum production.   Cardiovascular: Denies chest pain, chest tightness, palpitations or swelling in the hands or feet.  Gastrointestinal: Denies abdominal pain, bloating, constipation, diarrhea or blood in the stool.  GU: Denies urgency, frequency, pain with urination, burning sensation, blood in urine, odor or discharge. Musculoskeletal: Denies decrease in range of motion, difficulty with gait, muscle pain or joint pain and swelling.  Skin: Denies redness, rashes, lesions or ulcercations.  Neurological: Denies dizziness, difficulty with memory, difficulty with speech or problems with balance and coordination.  Psych: Pt reports anxiety and depression. Denies SI/HI.  No other specific complaints in a complete review of systems (except as listed in HPI above).  Objective:   Physical Exam  BP 128/76   Pulse 83   Temp 98.3 F (36.8 C) (Oral)   Ht 5' 9.33" (1.761 m)   Wt 249 lb (112.9 kg)   SpO2 98%   BMI 36.42 kg/m  Wt Readings from Last 3 Encounters:  02/12/18 249 lb (112.9 kg)  11/06/17 244 lb (110.7 kg)  07/24/17 251 lb (113.9 kg)    General: Appears her stated age, obese in NAD. Skin: Warm, dry and intact.  HEENT: Head: normal shape and size; Eyes: sclera white, no icterus, conjunctiva pink, PERRLA and EOMs intact; Left Ear: Tm's gray and intact, normal light reflex; Right Ear: cerumen impaction; Throat/Mouth: Teeth present, mucosa pink and moist, no exudate, lesions or ulcerations noted.  Neck:  Neck supple, trachea midline. No masses, lumps or thyromegaly present.  Cardiovascular: Normal rate and rhythm. S1,S2 noted.  No murmur, rubs or gallops noted. Trace BLE edema.  No carotid bruits noted. Pulmonary/Chest: Normal effort and positive vesicular breath sounds. No respiratory distress. No wheezes, rales or ronchi noted.  Abdomen: Soft and nontender. Normal bowel sounds. No distention or masses noted. Liver, spleen and kidneys non palpable. Musculoskeletal: Strength 5/5 BUE/BLE. No difficulty with gait.  Neurological: Alert and oriented. Cranial nerves II-XII grossly intact. Coordination normal.  Psychiatric: Mood and affect normal. Behavior is normal. Judgment and thought content normal.     BMET  Component Value Date/Time   NA 139 11/13/2017 1233   NA 138 09/01/2013 0017   K 3.5 11/13/2017 1233   K 3.0 (L) 09/01/2013 0017   CL 98 11/13/2017 1233   CL 100 09/01/2013 0017   CO2 33 (H) 11/13/2017 1233   CO2 33 (H) 09/01/2013 0017   GLUCOSE 158 (H) 11/13/2017 1233   GLUCOSE 208 (H) 09/01/2013 0017   BUN 17 11/13/2017 1233   BUN 18 09/01/2013 0017   CREATININE 0.59 11/13/2017 1233   CREATININE 0.59 01/16/2017 0945   CALCIUM 9.8 11/13/2017 1233   CALCIUM 9.3 09/01/2013 0017   GFRNONAA >60 09/01/2013 0017   GFRAA >60 09/01/2013 0017    Lipid Panel     Component Value Date/Time   CHOL 178 01/16/2017 0945   TRIG 215 (H) 01/16/2017 0945   HDL 44 (L) 01/16/2017 0945   CHOLHDL 4.0 01/16/2017 0945   VLDL 43 (H) 01/16/2017 0945   LDLCALC 91 01/16/2017 0945    CBC    Component Value Date/Time   WBC 9.0 04/01/2015 0738   RBC 4.65 04/01/2015 0738   HGB 13.3 04/01/2015 0738   HGB 13.8 09/01/2013 0017   HCT 40.1 04/01/2015 0738   HCT 40.9 09/01/2013 0017   PLT 215 04/01/2015 0738   PLT 216 09/01/2013 0017   MCV 86.2 04/01/2015 0738   MCV 85 09/01/2013 0017   MCH 28.6 04/01/2015 0738   MCHC 33.2 04/01/2015 0738   RDW 14.0 04/01/2015 0738   RDW 13.9 09/01/2013 0017   LYMPHSABS 2.3 04/01/2015 0738   MONOABS 0.3 04/01/2015 0738   EOSABS 0.1 04/01/2015 0738   BASOSABS 0.0 04/01/2015 0738    Hgb A1C Lab Results  Component Value Date    HGBA1C 6.9 (H) 11/13/2017            Assessment & Plan:   Preventative Health Maintenance:  Flu, tetanus and pneumovax UTD Pap smear, mammogram and colon screening UTD Encouraged her to consume a balanced diet and exercise regimen Advised her to see an eye doctor and dentist annually Will check CBC, CMET, Lipid, A1C, microalbumin and Vit D today.  RTC in 1 year, sooner if needed Webb Silversmith, NP

## 2018-02-12 NOTE — Assessment & Plan Note (Signed)
A1C and microalbumin today Encouraged her to consume a low fat diet, increase aerobic exercise Continue Glipizide, will adjust if needed based on labs Foot exam today Encouraged yearly eye exams Flu and pneumovax UTD

## 2018-02-12 NOTE — Assessment & Plan Note (Signed)
Controlled on Atenolol-Chlorthalidone CBC and CMET today Reinforced DASH diet and exercise for weight loss

## 2018-02-12 NOTE — Assessment & Plan Note (Signed)
She declines Wellbutrin wean at this time Support offered today

## 2018-02-12 NOTE — Assessment & Plan Note (Signed)
CMET and Lipid profile today Encouraged her to consume a low fat diet Continue Rosuvastatin for now, will adjust if needed based on labs

## 2018-02-12 NOTE — Patient Instructions (Signed)
Health Maintenance for Postmenopausal Women Menopause is a normal process in which your reproductive ability comes to an end. This process happens gradually over a span of months to years, usually between the ages of 22 and 9. Menopause is complete when you have missed 12 consecutive menstrual periods. It is important to talk with your health care provider about some of the most common conditions that affect postmenopausal women, such as heart disease, cancer, and bone loss (osteoporosis). Adopting a healthy lifestyle and getting preventive care can help to promote your health and wellness. Those actions can also lower your chances of developing some of these common conditions. What should I know about menopause? During menopause, you may experience a number of symptoms, such as:  Moderate-to-severe hot flashes.  Night sweats.  Decrease in sex drive.  Mood swings.  Headaches.  Tiredness.  Irritability.  Memory problems.  Insomnia.  Choosing to treat or not to treat menopausal changes is an individual decision that you make with your health care provider. What should I know about hormone replacement therapy and supplements? Hormone therapy products are effective for treating symptoms that are associated with menopause, such as hot flashes and night sweats. Hormone replacement carries certain risks, especially as you become older. If you are thinking about using estrogen or estrogen with progestin treatments, discuss the benefits and risks with your health care provider. What should I know about heart disease and stroke? Heart disease, heart attack, and stroke become more likely as you age. This may be due, in part, to the hormonal changes that your body experiences during menopause. These can affect how your body processes dietary fats, triglycerides, and cholesterol. Heart attack and stroke are both medical emergencies. There are many things that you can do to help prevent heart disease  and stroke:  Have your blood pressure checked at least every 1-2 years. High blood pressure causes heart disease and increases the risk of stroke.  If you are 53-22 years old, ask your health care provider if you should take aspirin to prevent a heart attack or a stroke.  Do not use any tobacco products, including cigarettes, chewing tobacco, or electronic cigarettes. If you need help quitting, ask your health care provider.  It is important to eat a healthy diet and maintain a healthy weight. ? Be sure to include plenty of vegetables, fruits, low-fat dairy products, and lean protein. ? Avoid eating foods that are high in solid fats, added sugars, or salt (sodium).  Get regular exercise. This is one of the most important things that you can do for your health. ? Try to exercise for at least 150 minutes each week. The type of exercise that you do should increase your heart rate and make you sweat. This is known as moderate-intensity exercise. ? Try to do strengthening exercises at least twice each week. Do these in addition to the moderate-intensity exercise.  Know your numbers.Ask your health care provider to check your cholesterol and your blood glucose. Continue to have your blood tested as directed by your health care provider.  What should I know about cancer screening? There are several types of cancer. Take the following steps to reduce your risk and to catch any cancer development as early as possible. Breast Cancer  Practice breast self-awareness. ? This means understanding how your breasts normally appear and feel. ? It also means doing regular breast self-exams. Let your health care provider know about any changes, no matter how small.  If you are 40  or older, have a clinician do a breast exam (clinical breast exam or CBE) every year. Depending on your age, family history, and medical history, it may be recommended that you also have a yearly breast X-ray (mammogram).  If you  have a family history of breast cancer, talk with your health care provider about genetic screening.  If you are at high risk for breast cancer, talk with your health care provider about having an MRI and a mammogram every year.  Breast cancer (BRCA) gene test is recommended for women who have family members with BRCA-related cancers. Results of the assessment will determine the need for genetic counseling and BRCA1 and for BRCA2 testing. BRCA-related cancers include these types: ? Breast. This occurs in males or females. ? Ovarian. ? Tubal. This may also be called fallopian tube cancer. ? Cancer of the abdominal or pelvic lining (peritoneal cancer). ? Prostate. ? Pancreatic.  Cervical, Uterine, and Ovarian Cancer Your health care provider may recommend that you be screened regularly for cancer of the pelvic organs. These include your ovaries, uterus, and vagina. This screening involves a pelvic exam, which includes checking for microscopic changes to the surface of your cervix (Pap test).  For women ages 21-65, health care providers may recommend a pelvic exam and a Pap test every three years. For women ages 79-65, they may recommend the Pap test and pelvic exam, combined with testing for human papilloma virus (HPV), every five years. Some types of HPV increase your risk of cervical cancer. Testing for HPV may also be done on women of any age who have unclear Pap test results.  Other health care providers may not recommend any screening for nonpregnant women who are considered low risk for pelvic cancer and have no symptoms. Ask your health care provider if a screening pelvic exam is right for you.  If you have had past treatment for cervical cancer or a condition that could lead to cancer, you need Pap tests and screening for cancer for at least 20 years after your treatment. If Pap tests have been discontinued for you, your risk factors (such as having a new sexual partner) need to be  reassessed to determine if you should start having screenings again. Some women have medical problems that increase the chance of getting cervical cancer. In these cases, your health care provider may recommend that you have screening and Pap tests more often.  If you have a family history of uterine cancer or ovarian cancer, talk with your health care provider about genetic screening.  If you have vaginal bleeding after reaching menopause, tell your health care provider.  There are currently no reliable tests available to screen for ovarian cancer.  Lung Cancer Lung cancer screening is recommended for adults 69-62 years old who are at high risk for lung cancer because of a history of smoking. A yearly low-dose CT scan of the lungs is recommended if you:  Currently smoke.  Have a history of at least 30 pack-years of smoking and you currently smoke or have quit within the past 15 years. A pack-year is smoking an average of one pack of cigarettes per day for one year.  Yearly screening should:  Continue until it has been 15 years since you quit.  Stop if you develop a health problem that would prevent you from having lung cancer treatment.  Colorectal Cancer  This type of cancer can be detected and can often be prevented.  Routine colorectal cancer screening usually begins at  age 42 and continues through age 45.  If you have risk factors for colon cancer, your health care provider may recommend that you be screened at an earlier age.  If you have a family history of colorectal cancer, talk with your health care provider about genetic screening.  Your health care provider may also recommend using home test kits to check for hidden blood in your stool.  A small camera at the end of a tube can be used to examine your colon directly (sigmoidoscopy or colonoscopy). This is done to check for the earliest forms of colorectal cancer.  Direct examination of the colon should be repeated every  5-10 years until age 71. However, if early forms of precancerous polyps or small growths are found or if you have a family history or genetic risk for colorectal cancer, you may need to be screened more often.  Skin Cancer  Check your skin from head to toe regularly.  Monitor any moles. Be sure to tell your health care provider: ? About any new moles or changes in moles, especially if there is a change in a mole's shape or color. ? If you have a mole that is larger than the size of a pencil eraser.  If any of your family members has a history of skin cancer, especially at a young age, talk with your health care provider about genetic screening.  Always use sunscreen. Apply sunscreen liberally and repeatedly throughout the day.  Whenever you are outside, protect yourself by wearing long sleeves, pants, a wide-brimmed hat, and sunglasses.  What should I know about osteoporosis? Osteoporosis is a condition in which bone destruction happens more quickly than new bone creation. After menopause, you may be at an increased risk for osteoporosis. To help prevent osteoporosis or the bone fractures that can happen because of osteoporosis, the following is recommended:  If you are 46-71 years old, get at least 1,000 mg of calcium and at least 600 mg of vitamin D per day.  If you are older than age 55 but younger than age 65, get at least 1,200 mg of calcium and at least 600 mg of vitamin D per day.  If you are older than age 54, get at least 1,200 mg of calcium and at least 800 mg of vitamin D per day.  Smoking and excessive alcohol intake increase the risk of osteoporosis. Eat foods that are rich in calcium and vitamin D, and do weight-bearing exercises several times each week as directed by your health care provider. What should I know about how menopause affects my mental health? Depression may occur at any age, but it is more common as you become older. Common symptoms of depression  include:  Low or sad mood.  Changes in sleep patterns.  Changes in appetite or eating patterns.  Feeling an overall lack of motivation or enjoyment of activities that you previously enjoyed.  Frequent crying spells.  Talk with your health care provider if you think that you are experiencing depression. What should I know about immunizations? It is important that you get and maintain your immunizations. These include:  Tetanus, diphtheria, and pertussis (Tdap) booster vaccine.  Influenza every year before the flu season begins.  Pneumonia vaccine.  Shingles vaccine.  Your health care provider may also recommend other immunizations. This information is not intended to replace advice given to you by your health care provider. Make sure you discuss any questions you have with your health care provider. Document Released: 07/13/2005  Document Revised: 12/09/2015 Document Reviewed: 02/22/2015 Elsevier Interactive Patient Education  2018 [employer] Inc[employer]  

## 2018-02-13 LAB — LIPID PANEL
Cholesterol: 170 mg/dL (ref 0–200)
HDL: 43.6 mg/dL (ref >39.00)
NonHDL: 126.21
Total CHOL/HDL Ratio: 4
Triglycerides: 267 mg/dL — ABNORMAL HIGH (ref 0.0–149.0)
VLDL: 53.4 mg/dL — ABNORMAL HIGH (ref 0.0–40.0)

## 2018-02-13 LAB — CBC
HCT: 38.5 % (ref 36.0–46.0)
Hemoglobin: 13.1 g/dL (ref 12.0–15.0)
MCHC: 34 g/dL (ref 30.0–36.0)
MCV: 85.1 fl (ref 78.0–100.0)
Platelets: 198 10*3/uL (ref 150.0–400.0)
RBC: 4.52 Mil/uL (ref 3.87–5.11)
RDW: 15 % (ref 11.5–15.5)
WBC: 9.8 10*3/uL (ref 4.0–10.5)

## 2018-02-13 LAB — COMPREHENSIVE METABOLIC PANEL
ALT: 32 U/L (ref 0–35)
AST: 33 U/L (ref 0–37)
Albumin: 4 g/dL (ref 3.5–5.2)
Alkaline Phosphatase: 88 U/L (ref 39–117)
BUN: 21 mg/dL (ref 6–23)
CO2: 35 mEq/L — ABNORMAL HIGH (ref 19–32)
Calcium: 9.7 mg/dL (ref 8.4–10.5)
Chloride: 96 mEq/L (ref 96–112)
Creatinine, Ser: 0.73 mg/dL (ref 0.40–1.20)
GFR: 86.74 mL/min (ref >60.00)
Glucose, Bld: 130 mg/dL — ABNORMAL HIGH (ref 70–99)
Potassium: 3.6 mEq/L (ref 3.5–5.1)
Sodium: 140 mEq/L (ref 135–145)
Total Bilirubin: 0.4 mg/dL (ref 0.2–1.2)
Total Protein: 7.1 g/dL (ref 6.0–8.3)

## 2018-02-13 LAB — VITAMIN D 25 HYDROXY (VIT D DEFICIENCY, FRACTURES): VITD: 41.27 ng/mL (ref 30.00–100.00)

## 2018-02-13 LAB — HEMOGLOBIN A1C: Hgb A1c MFr Bld: 7.7 % — ABNORMAL HIGH (ref 4.6–6.5)

## 2018-02-13 LAB — MICROALBUMIN / CREATININE URINE RATIO
Creatinine,U: 105.8 mg/dL
Microalb Creat Ratio: 0.8 mg/g (ref 0.0–30.0)
Microalb, Ur: 0.8 mg/dL (ref 0.0–1.9)

## 2018-02-13 LAB — LDL CHOLESTEROL, DIRECT: Direct LDL: 111 mg/dL

## 2018-02-14 ENCOUNTER — Encounter: Payer: Self-pay | Admitting: Internal Medicine

## 2018-02-15 MED ORDER — BUPROPION HCL ER (XL) 150 MG PO TB24: 150.0000 mg | ORAL_TABLET | Freq: Every day | ORAL | 1 refills | Status: DC

## 2018-02-15 MED ORDER — ATENOLOL-CHLORTHALIDONE 50-25 MG PO TABS: 1.0000 | ORAL_TABLET | Freq: Every day | ORAL | 1 refills | Status: DC

## 2018-02-15 MED ORDER — ROSUVASTATIN CALCIUM 10 MG PO TABS: 10.0000 mg | ORAL_TABLET | Freq: Every day | ORAL | 0 refills | Status: DC

## 2018-02-15 MED ORDER — GLIPIZIDE 10 MG PO TABS: 10.0000 mg | ORAL_TABLET | Freq: Two times a day (BID) | ORAL | 0 refills | Status: DC

## 2018-02-15 MED ORDER — FENOFIBRATE 54 MG PO TABS: 54.0000 mg | ORAL_TABLET | Freq: Every day | ORAL | 0 refills | Status: DC

## 2018-02-15 NOTE — Addendum Note (Signed)
Addended by: Lurlean Nanny on: 02/15/2018 11:51 AM   Modules accepted: Orders

## 2018-02-16 ENCOUNTER — Other Ambulatory Visit: Payer: Self-pay | Admitting: Internal Medicine

## 2018-04-14 ENCOUNTER — Encounter: Payer: Self-pay | Admitting: Internal Medicine

## 2018-05-18 ENCOUNTER — Other Ambulatory Visit: Payer: Self-pay | Admitting: Internal Medicine

## 2018-05-20 MED ORDER — GLIPIZIDE 10 MG PO TABS: 10.0000 mg | ORAL_TABLET | Freq: Two times a day (BID) | ORAL | 0 refills | Status: DC

## 2018-05-20 MED ORDER — ROSUVASTATIN CALCIUM 10 MG PO TABS: 10.0000 mg | ORAL_TABLET | Freq: Every day | ORAL | 0 refills | Status: DC

## 2018-05-20 MED ORDER — FENOFIBRATE 54 MG PO TABS: 54.0000 mg | ORAL_TABLET | Freq: Every day | ORAL | 0 refills | Status: DC

## 2018-06-25 ENCOUNTER — Encounter: Payer: Self-pay | Admitting: Internal Medicine

## 2018-06-25 ENCOUNTER — Ambulatory Visit (INDEPENDENT_AMBULATORY_CARE_PROVIDER_SITE_OTHER): Payer: 59 | Admitting: Internal Medicine

## 2018-06-25 VITALS — BP 136/84 | HR 76 | Temp 98.0°F | Wt 250.0 lb

## 2018-06-25 DIAGNOSIS — E119 Type 2 diabetes mellitus without complications: Secondary | ICD-10-CM

## 2018-06-25 DIAGNOSIS — E7800 Pure hypercholesterolemia, unspecified: Secondary | ICD-10-CM

## 2018-06-25 DIAGNOSIS — I10 Essential (primary) hypertension: Secondary | ICD-10-CM

## 2018-06-25 NOTE — Assessment & Plan Note (Signed)
A1C today microalbumin done in the last year Encouraged her to consume a low carb diet and exercise for weight loss Continue Glipizide for now Foot exam today Encouraged yearly eye exam Flu and pneumovax UTD

## 2018-06-25 NOTE — Assessment & Plan Note (Signed)
Borderline control on Atenolol- Chlorthalidone Reinforced DASH diet and exercise for weight loss CMET today

## 2018-06-25 NOTE — Assessment & Plan Note (Signed)
CMET and Lipid profile today Encouraged her to consume a low fat diet  Continue Rosuvastatin and Fenofibrate for now, will adjust if needed based on labs

## 2018-06-25 NOTE — Patient Instructions (Signed)
Fat and Cholesterol Restricted Eating Plan Getting too much fat and cholesterol in your diet may cause health problems. Choosing the right foods helps keep your fat and cholesterol at normal levels. This can keep you from getting certain diseases. Your doctor may recommend an eating plan that includes:  Total fat: ______% or less of total calories a day.  Saturated fat: ______% or less of total calories a day.  Cholesterol: less than _________mg a day.  Fiber: ______g a day. What are tips for following this plan? Meal planning  At meals, divide your plate into four equal parts: ? Fill one-half of your plate with vegetables and green salads. ? Fill one-fourth of your plate with whole grains. ? Fill one-fourth of your plate with low-fat (lean) protein foods.  Eat fish that is high in omega-3 fats at least two times a week. This includes mackerel, tuna, sardines, and salmon.  Eat foods that are high in fiber, such as whole grains, beans, apples, broccoli, carrots, peas, and barley. General tips   Work with your doctor to lose weight if you need to.  Avoid: ? Foods with added sugar. ? Fried foods. ? Foods with partially hydrogenated oils.  Limit alcohol intake to no more than 1 drink a day for nonpregnant women and 2 drinks a day for men. One drink equals 12 oz of beer, 5 oz of wine, or 1 oz of hard liquor. Reading food labels  Check food labels for: ? Trans fats. ? Partially hydrogenated oils. ? Saturated fat (g) in each serving. ? Cholesterol (mg) in each serving. ? Fiber (g) in each serving.  Choose foods with healthy fats, such as: ? Monounsaturated fats. ? Polyunsaturated fats. ? Omega-3 fats.  Choose grain products that have whole grains. Look for the word "whole" as the first word in the ingredient list. Cooking  Cook foods using low-fat methods. These include baking, boiling, grilling, and broiling.  Eat more home-cooked foods. Eat at restaurants and buffets  less often.  Avoid cooking using saturated fats, such as butter, cream, palm oil, palm kernel oil, and coconut oil. Recommended foods  Fruits  All fresh, canned (in natural juice), or frozen fruits. Vegetables  Fresh or frozen vegetables (raw, steamed, roasted, or grilled). Green salads. Grains  Whole grains, such as whole wheat or whole grain breads, crackers, cereals, and pasta. Unsweetened oatmeal, bulgur, barley, quinoa, or brown rice. Corn or whole wheat flour tortillas. Meats and other protein foods  Ground beef (85% or leaner), grass-fed beef, or beef trimmed of fat. Skinless chicken or turkey. Ground chicken or turkey. Pork trimmed of fat. All fish and seafood. Egg whites. Dried beans, peas, or lentils. Unsalted nuts or seeds. Unsalted canned beans. Nut butters without added sugar or oil. Dairy  Low-fat or nonfat dairy products, such as skim or 1% milk, 2% or reduced-fat cheeses, low-fat and fat-free ricotta or cottage cheese, or plain low-fat and nonfat yogurt. Fats and oils  Tub margarine without trans fats. Light or reduced-fat mayonnaise and salad dressings. Avocado. Olive, canola, sesame, or safflower oils. The items listed above may not be a complete list of foods and beverages you can eat. Contact a dietitian for more information. Foods to avoid Fruits  Canned fruit in heavy syrup. Fruit in cream or butter sauce. Fried fruit. Vegetables  Vegetables cooked in cheese, cream, or butter sauce. Fried vegetables. Grains  White bread. White pasta. White rice. Cornbread. Bagels, pastries, and croissants. Crackers and snack foods that contain trans fat   and hydrogenated oils. Meats and other protein foods  Fatty cuts of meat. Ribs, chicken wings, bacon, sausage, bologna, salami, chitterlings, fatback, hot dogs, bratwurst, and packaged lunch meats. Liver and organ meats. Whole eggs and egg yolks. Chicken and turkey with skin. Fried meat. Dairy  Whole or 2% milk, cream,  half-and-half, and cream cheese. Whole milk cheeses. Whole-fat or sweetened yogurt. Full-fat cheeses. Nondairy creamers and whipped toppings. Processed cheese, cheese spreads, and cheese curds. Beverages  Alcohol. Sugar-sweetened drinks such as sodas, lemonade, and fruit drinks. Fats and oils  Butter, stick margarine, lard, shortening, ghee, or bacon fat. Coconut, palm kernel, and palm oils. Sweets and desserts  Corn syrup, sugars, honey, and molasses. Candy. Jam and jelly. Syrup. Sweetened cereals. Cookies, pies, cakes, donuts, muffins, and ice cream. The items listed above may not be a complete list of foods and beverages you should avoid. Contact a dietitian for more information. Summary  Choosing the right foods helps keep your fat and cholesterol at normal levels. This can keep you from getting certain diseases.  At meals, fill one-half of your plate with vegetables and green salads.  Eat high-fiber foods, like whole grains, beans, apples, carrots, peas, and barley.  Limit added sugar, saturated fats, alcohol, and fried foods. This information is not intended to replace advice given to you by your health care provider. Make sure you discuss any questions you have with your health care provider. Document Released: 11/20/2011 Document Revised: 01/22/2018 Document Reviewed: 02/05/2017 Elsevier Interactive Patient Education  2019 Elsevier Inc.   

## 2018-06-25 NOTE — Progress Notes (Signed)
Subjective:    Patient ID: Hannah Giles, female    DOB: 1960/11/25, 58 y.o.   MRN: [id number]  HPI  Pt presents to the clinic today to follow up chronic conditions.   HTN: Her BP today is 136/84. She is taking Atenolol-Chlorthalidone as prescribed. There is no ECG on file.  HLD: Her last LDL was 111, triglycerides 267, 02/2018. Her Rosuvastatin was increased to 10 mg daily at that time, and Fenofibrate was added to her regimen. She has been taking the medication as prescribed. She has been trying to consume a low fat diet.  DM 2: Her last A1C was 7.7%. Her Glipizide was increased to 10 mg BID. She has been taking the medication as prescribed. She does not check her sugars. She checks her feet routinely. Her last eye exam was 01/2018. Flu 02/2018. Pneumovax 01/2015.    Review of Systems      Past Medical History:  Diagnosis Date  . Hyperlipidemia   . Hypertension   . Urinary incontinence     Current Outpatient Medications  Medication Sig Dispense Refill  . atenolol-chlorthalidone (TENORETIC) 50-25 MG tablet Take 1 tablet by mouth daily. 90 tablet 1  . buPROPion (WELLBUTRIN XL) 150 MG 24 hr tablet Take 1 tablet (150 mg total) by mouth daily. 90 tablet 1  . fenofibrate 54 MG tablet Take 1 tablet (54 mg total) by mouth daily. 90 tablet 0  . glipiZIDE (GLUCOTROL) 10 MG tablet Take 1 tablet (10 mg total) by mouth 2 (two) times daily before a meal. 180 tablet 0  . glucose blood (FREESTYLE LITE) test strip 1 each by Other route 2 (two) times daily. Use as instructed 100 each 12  . INTRAROSA 6.5 MG INST Place 1 suppository vaginally at bedtime.  2  . polyethylene glycol (MIRALAX / GLYCOLAX) packet Take 17 g by mouth daily.    . rosuvastatin (CRESTOR) 10 MG tablet Take 1 tablet (10 mg total) by mouth daily. 90 tablet 0  . triamcinolone cream (KENALOG) 0.1 %   1   No current facility-administered medications for this visit.     Allergies  Allergen Reactions  . Niacin     REACTION:  flushing, SOB face numb    Family History  Problem Relation Age of Onset  . Cancer Mother        colon  . Colon cancer Mother   . Heart disease Father        s/p stents in the 60's  . Dementia Father   . Diabetes Paternal Uncle   . Hypertension Sister   . Pancreatic cancer Paternal Grandfather   . Rectal cancer Neg Hx   . Stomach cancer Neg Hx     Social History   Socioeconomic History  . Marital status: Married    Spouse name: Not on file  . Number of children: Not on file  . Years of education: Not on file  . Highest education level: Not on file  Occupational History  . Occupation: Therapist, sports, [place]  . Financial resource strain: Not on file  . Food insecurity:    Worry: Not on file    Inability: Not on file  . Transportation needs:    Medical: Not on file    Non-medical: Not on file  Tobacco Use  . Smoking status: Former Smoker    Packs/day: 0.50    Years: 13.00    Pack years: 6.50    Types: Cigarettes    Last attempt  to quit: 06/04/1989    Years since quitting: 29.0  . Smokeless tobacco: Never Used  Substance and Sexual Activity  . Alcohol use: Yes    Alcohol/week: 3.0 - 4.0 standard drinks    Types: 3 - 4 Glasses of wine per week    Comment: socially  . Drug use: No  . Sexual activity: Not on file  Lifestyle  . Physical activity:    Days per week: Not on file    Minutes per session: Not on file  . Stress: Not on file  Relationships  . Social connections:    Talks on phone: Not on file    Gets together: Not on file    Attends religious service: Not on file    Active member of club or organization: Not on file    Attends meetings of clubs or organizations: Not on file    Relationship status: Not on file  . Intimate partner violence:    Fear of current or ex partner: Not on file    Emotionally abused: Not on file    Physically abused: Not on file    Forced sexual activity: Not on file  Other Topics Concern  . Not on file  Social  History Narrative  . Not on file     Constitutional: Denies fever, malaise, fatigue, headache or abrupt weight changes.  HEENT: Pt reports dry mouth. Denies eye pain, eye redness, ear pain, ringing in the ears, wax buildup, runny nose, nasal congestion, bloody nose, or sore throat. Respiratory: Denies difficulty breathing, shortness of breath, cough or sputum production.   Cardiovascular: Denies chest pain, chest tightness, palpitations or swelling in the hands or feet.  Gastrointestinal: Denies abdominal pain, bloating, constipation, diarrhea or blood in the stool.  Skin: Denies redness, rashes, lesions or ulcercations.  Neurological: Denies dizziness, difficulty with memory, difficulty with speech or problems with balance and coordination.  .  No other specific complaints in a complete review of systems (except as listed in HPI above).  Objective:   Physical Exam   BP 136/84   Pulse 76   Temp 98 F (36.7 C) (Oral)   Wt 250 lb (113.4 kg)   SpO2 98%   BMI 36.57 kg/m  Wt Readings from Last 3 Encounters:  06/25/18 250 lb (113.4 kg)  02/12/18 249 lb (112.9 kg)  11/06/17 244 lb (110.7 kg)    General: Appears her stated age, obese, in NAD. Skin: Warm, dry and intact. No ulcerations noted. Cardiovascular: Normal rate and rhythm. S1,S2 noted.  No murmur, rubs or gallops noted.  Pulmonary/Chest: Normal effort and positive vesicular breath sounds. No respiratory distress. No wheezes, rales or ronchi noted.  Neurological: Alert and oriented. Sensation intact to BLE.   BMET    Component Value Date/Time   NA 140 02/12/2018 1546   NA 138 09/01/2013 0017   K 3.6 02/12/2018 1546   K 3.0 (L) 09/01/2013 0017   CL 96 02/12/2018 1546   CL 100 09/01/2013 0017   CO2 35 (H) 02/12/2018 1546   CO2 33 (H) 09/01/2013 0017   GLUCOSE 130 (H) 02/12/2018 1546   GLUCOSE 208 (H) 09/01/2013 0017   BUN 21 02/12/2018 1546   BUN 18 09/01/2013 0017   CREATININE 0.73 02/12/2018 1546   CREATININE  0.59 01/16/2017 0945   CALCIUM 9.7 02/12/2018 1546   CALCIUM 9.3 09/01/2013 0017   GFRNONAA >60 09/01/2013 0017   GFRAA >60 09/01/2013 0017    Lipid Panel     Component  Value Date/Time   CHOL 170 02/12/2018 1546   TRIG 267.0 (H) 02/12/2018 1546   HDL 43.60 02/12/2018 1546   CHOLHDL 4 02/12/2018 1546   VLDL 53.4 (H) 02/12/2018 1546   LDLCALC 91 01/16/2017 0945    CBC    Component Value Date/Time   WBC 9.8 02/12/2018 1546   RBC 4.52 02/12/2018 1546   HGB 13.1 02/12/2018 1546   HGB 13.8 09/01/2013 0017   HCT 38.5 02/12/2018 1546   HCT 40.9 09/01/2013 0017   PLT 198.0 02/12/2018 1546   PLT 216 09/01/2013 0017   MCV 85.1 02/12/2018 1546   MCV 85 09/01/2013 0017   MCH 28.6 04/01/2015 0738   MCHC 34.0 02/12/2018 1546   RDW 15.0 02/12/2018 1546   RDW 13.9 09/01/2013 0017   LYMPHSABS 2.3 04/01/2015 0738   MONOABS 0.3 04/01/2015 0738   EOSABS 0.1 04/01/2015 0738   BASOSABS 0.0 04/01/2015 0738    Hgb A1C Lab Results  Component Value Date   HGBA1C 7.7 (H) 02/12/2018           Assessment & Plan:

## 2018-06-26 LAB — COMPREHENSIVE METABOLIC PANEL
ALT: 29 U/L (ref 0–35)
AST: 38 U/L — ABNORMAL HIGH (ref 0–37)
Albumin: 4.2 g/dL (ref 3.5–5.2)
Alkaline Phosphatase: 79 U/L (ref 39–117)
BUN: 19 mg/dL (ref 6–23)
CO2: 33 mEq/L — ABNORMAL HIGH (ref 19–32)
Calcium: 10.5 mg/dL (ref 8.4–10.5)
Chloride: 96 mEq/L (ref 96–112)
Creatinine, Ser: 0.98 mg/dL (ref 0.40–1.20)
GFR: 58.03 mL/min — ABNORMAL LOW (ref >60.00)
Glucose, Bld: 198 mg/dL — ABNORMAL HIGH (ref 70–99)
Potassium: 4.1 mEq/L (ref 3.5–5.1)
Sodium: 138 mEq/L (ref 135–145)
Total Bilirubin: 0.4 mg/dL (ref 0.2–1.2)
Total Protein: 7.3 g/dL (ref 6.0–8.3)

## 2018-06-26 LAB — LDL CHOLESTEROL, DIRECT: Direct LDL: 103 mg/dL

## 2018-06-26 LAB — LIPID PANEL
Cholesterol: 171 mg/dL (ref 0–200)
HDL: 46 mg/dL (ref >39.00)
NonHDL: 124.66
Total CHOL/HDL Ratio: 4
Triglycerides: 319 mg/dL — ABNORMAL HIGH (ref 0.0–149.0)
VLDL: 63.8 mg/dL — ABNORMAL HIGH (ref 0.0–40.0)

## 2018-06-26 LAB — HEMOGLOBIN A1C: Hgb A1c MFr Bld: 8.5 % — ABNORMAL HIGH (ref 4.6–6.5)

## 2018-07-03 ENCOUNTER — Encounter: Payer: Self-pay | Admitting: Internal Medicine

## 2018-08-12 ENCOUNTER — Other Ambulatory Visit: Payer: Self-pay | Admitting: Internal Medicine

## 2018-08-13 MED ORDER — FENOFIBRATE 67 MG PO CAPS: 67.0000 mg | ORAL_CAPSULE | Freq: Every day | ORAL | 0 refills | Status: DC

## 2018-08-25 ENCOUNTER — Other Ambulatory Visit: Payer: Self-pay | Admitting: Internal Medicine

## 2018-08-25 MED ORDER — GLIPIZIDE 10 MG PO TABS: 10.0000 mg | ORAL_TABLET | Freq: Two times a day (BID) | ORAL | 0 refills | Status: DC

## 2018-08-25 MED ORDER — ROSUVASTATIN CALCIUM 10 MG PO TABS: 10.0000 mg | ORAL_TABLET | Freq: Every day | ORAL | 0 refills | Status: DC

## 2018-09-23 ENCOUNTER — Encounter: Payer: Self-pay | Admitting: Internal Medicine

## 2018-09-25 ENCOUNTER — Other Ambulatory Visit: Payer: 59

## 2018-09-25 ENCOUNTER — Other Ambulatory Visit: Payer: Self-pay

## 2018-09-25 ENCOUNTER — Telehealth: Payer: Self-pay

## 2018-09-25 ENCOUNTER — Ambulatory Visit (INDEPENDENT_AMBULATORY_CARE_PROVIDER_SITE_OTHER): Payer: 59 | Admitting: Internal Medicine

## 2018-09-25 ENCOUNTER — Encounter: Payer: Self-pay | Admitting: Internal Medicine

## 2018-09-25 VITALS — Wt 223.5 lb

## 2018-09-25 DIAGNOSIS — E7800 Pure hypercholesterolemia, unspecified: Secondary | ICD-10-CM | POA: Diagnosis not present

## 2018-09-25 DIAGNOSIS — I10 Essential (primary) hypertension: Secondary | ICD-10-CM

## 2018-09-25 DIAGNOSIS — E119 Type 2 diabetes mellitus without complications: Secondary | ICD-10-CM | POA: Diagnosis not present

## 2018-09-25 DIAGNOSIS — F4323 Adjustment disorder with mixed anxiety and depressed mood: Secondary | ICD-10-CM

## 2018-09-25 NOTE — Assessment & Plan Note (Signed)
Will have her set up lab only appt for A1C and urine microalbumin Encouraged her to consume a low carb diet Continue Glipizide for now Encouraged low carb diet Congratulated her on weight loss Encouraged routine foot exams Encouraged yearly eye exams Flu and pneumovax UTD

## 2018-09-25 NOTE — Progress Notes (Signed)
Virtual Visit via Video Note  I connected with Hannah Giles on 09/25/18 at 10:00 AM EDT by a video enabled telemedicine application and verified that I am speaking with the correct person using two identifiers.   I discussed the limitations of evaluation and management by telemedicine and the availability of in person appointments. The patient expressed understanding and agreed to proceed.  Patient Location: Provider Location: Office  History of Present Illness:  Pt due for follow up of chronic conditions.  HTN: She does not monitor her BP at home. She is taking Atenolol -Chlorthalidone as prescribed. She has intermittently felt lightheaded at times but denies syncopal episodes.There is no ECG on file.  HLD: Her last LDL was 103, triglycerides 319, 06/2018. She is taking Rosuvastatin and Fenofibrate as prescribed. She denies myalgias. She tries to consume a low fat diet.  DM 2: Her last A1C was 8.5%, 06/2018. She is taking Glipizide as prescribed. She refused to start Januvia as recommended. Her sugars range 105-124. She has lost 21 lbs. She checks her feet routinely. Her last eye exam was 01/2018. Flu 02/2018. Pneumovax 01/2015.  Anxiety and Depression: Chronic. She reports her mood is stable on Wellbutrin. She is not interested in weaning medication at this time. She does not see a therapist. She denies SI/HI.  Past Medical History:  Diagnosis Date  . Hyperlipidemia   . Hypertension   . Urinary incontinence     Current Outpatient Medications  Medication Sig Dispense Refill  . atenolol-chlorthalidone (TENORETIC) 50-25 MG tablet Take 1 tablet by mouth daily. 90 tablet 1  . buPROPion (WELLBUTRIN XL) 150 MG 24 hr tablet Take 1 tablet (150 mg total) by mouth daily. 90 tablet 1  . Cholecalciferol (VITAMIN D) 50 MCG (2000 UT) tablet Take 4,000 Units by mouth daily.    . fenofibrate 54 MG tablet Take 1 tablet (54 mg total) by mouth daily. 90 tablet 0  . fenofibrate micronized (LOFIBRA) 67 MG  capsule Take 1 capsule (67 mg total) by mouth daily before breakfast. 90 capsule 0  . glipiZIDE (GLUCOTROL) 10 MG tablet Take 1 tablet (10 mg total) by mouth 2 (two) times daily before a meal. 180 tablet 0  . glucose blood (FREESTYLE LITE) test strip 1 each by Other route 2 (two) times daily. Use as instructed 100 each 12  . INTRAROSA 6.5 MG INST Place 1 suppository vaginally at bedtime.  2  . polyethylene glycol (MIRALAX / GLYCOLAX) packet Take 17 g by mouth daily.    . rosuvastatin (CRESTOR) 10 MG tablet Take 1 tablet (10 mg total) by mouth daily. 90 tablet 0  . triamcinolone cream (KENALOG) 0.1 %   1   No current facility-administered medications for this visit.     Allergies  Allergen Reactions  . Niacin     REACTION: flushing, SOB face numb    Family History  Problem Relation Age of Onset  . Cancer Mother        colon  . Colon cancer Mother   . Heart disease Father        s/p stents in the 60's  . Dementia Father   . Diabetes Paternal Uncle   . Hypertension Sister   . Pancreatic cancer Paternal Grandfather   . Rectal cancer Neg Hx   . Stomach cancer Neg Hx     Social History   Socioeconomic History  . Marital status: Married    Spouse name: Not on file  . Number of children: Not on file  .  Years of education: Not on file  . Highest education level: Not on file  Occupational History  . Occupation: Therapist, sports, [employer]  . Financial resource strain: Not on file  . Food insecurity:    Worry: Not on file    Inability: Not on file  . Transportation needs:    Medical: Not on file    Non-medical: Not on file  Tobacco Use  . Smoking status: Former Smoker    Packs/day: 0.50    Years: 13.00    Pack years: 6.50    Types: Cigarettes    Last attempt to quit: 06/04/1989    Years since quitting: 29.3  . Smokeless tobacco: Never Used  Substance and Sexual Activity  . Alcohol use: Yes    Alcohol/week: 3.0 - 4.0 standard drinks    Types: 3 - 4 Glasses of  wine per week    Comment: socially  . Drug use: No  . Sexual activity: Not on file  Lifestyle  . Physical activity:    Days per week: Not on file    Minutes per session: Not on file  . Stress: Not on file  Relationships  . Social connections:    Talks on phone: Not on file    Gets together: Not on file    Attends religious service: Not on file    Active member of club or organization: Not on file    Attends meetings of clubs or organizations: Not on file    Relationship status: Not on file  . Intimate partner violence:    Fear of current or ex partner: Not on file    Emotionally abused: Not on file    Physically abused: Not on file    Forced sexual activity: Not on file  Other Topics Concern  . Not on file  Social History Narrative  . Not on file     Constitutional: Pt reports intentional weight loss. Denies fever, malaise, fatigue, headache.  HEENT: Denies eye pain, eye redness, ear pain, ringing in the ears, wax buildup, runny nose, nasal congestion, bloody nose, or sore throat. Respiratory: Denies difficulty breathing, shortness of breath, cough or sputum production.   Cardiovascular: Denies chest pain, chest tightness, palpitations or swelling in the hands or feet.  Gastrointestinal: Denies abdominal pain, bloating, constipation, diarrhea or blood in the stool.  GU: Denies urgency, frequency, pain with urination, burning sensation, blood in urine, odor or discharge. Musculoskeletal: Denies decrease in range of motion, difficulty with gait, muscle pain or joint pain and swelling.  Skin: Denies redness, rashes, lesions or ulcercations.  Neurological: Pt reports intermittent lightheadedness. Denies dizziness, difficulty with memory, difficulty with speech or problems with balance and coordination.  Psych: Pt has a history of anxiety and depression. Denies SI/HI.  No other specific complaints in a complete review of systems (except as listed in HPI above).    Wt Readings  from Last 3 Encounters:  06/25/18 250 lb (113.4 kg)  02/12/18 249 lb (112.9 kg)  11/06/17 244 lb (110.7 kg)    General: Appears her stated age, obese, in NAD. Skin: Warm, dry and intact. No obvious lower extremity ulcerations noted. Pulmonary/Chest: Normal effort . No respiratory distress.  Neurological: Alert and oriented.  Psychiatric: Mood and affect normal. Behavior is normal. Judgment and thought content normal.     BMET    Component Value Date/Time   NA 138 06/25/2018 1601   NA 138 09/01/2013 0017   K 4.1 06/25/2018 1601  K 3.0 (L) 09/01/2013 0017   CL 96 06/25/2018 1601   CL 100 09/01/2013 0017   CO2 33 (H) 06/25/2018 1601   CO2 33 (H) 09/01/2013 0017   GLUCOSE 198 (H) 06/25/2018 1601   GLUCOSE 208 (H) 09/01/2013 0017   BUN 19 06/25/2018 1601   BUN 18 09/01/2013 0017   CREATININE 0.98 06/25/2018 1601   CREATININE 0.59 01/16/2017 0945   CALCIUM 10.5 06/25/2018 1601   CALCIUM 9.3 09/01/2013 0017   GFRNONAA >60 09/01/2013 0017   GFRAA >60 09/01/2013 0017    Lipid Panel     Component Value Date/Time   CHOL 171 06/25/2018 1601   TRIG 319.0 (H) 06/25/2018 1601   HDL 46.00 06/25/2018 1601   CHOLHDL 4 06/25/2018 1601   VLDL 63.8 (H) 06/25/2018 1601   LDLCALC 91 01/16/2017 0945    CBC    Component Value Date/Time   WBC 9.8 02/12/2018 1546   RBC 4.52 02/12/2018 1546   HGB 13.1 02/12/2018 1546   HGB 13.8 09/01/2013 0017   HCT 38.5 02/12/2018 1546   HCT 40.9 09/01/2013 0017   PLT 198.0 02/12/2018 1546   PLT 216 09/01/2013 0017   MCV 85.1 02/12/2018 1546   MCV 85 09/01/2013 0017   MCH 28.6 04/01/2015 0738   MCHC 34.0 02/12/2018 1546   RDW 15.0 02/12/2018 1546   RDW 13.9 09/01/2013 0017   LYMPHSABS 2.3 04/01/2015 0738   MONOABS 0.3 04/01/2015 0738   EOSABS 0.1 04/01/2015 0738   BASOSABS 0.0 04/01/2015 0738    Hgb A1C Lab Results  Component Value Date   HGBA1C 8.5 (H) 06/25/2018       Assessment and Plan:  See problem based  charting.  Follow Up Instructions:    I discussed the assessment and treatment plan with the patient. The patient was provided an opportunity to ask questions and all were answered. The patient agreed with the plan and demonstrated an understanding of the instructions.   The patient was advised to call back or seek an in-person evaluation if the symptoms worsen or if the condition fails to improve as anticipated.     Webb Silversmith, NP

## 2018-09-25 NOTE — Telephone Encounter (Signed)
Patient had office visit on 09/25/18 @ 1000 with PCP.

## 2018-09-25 NOTE — Assessment & Plan Note (Addendum)
Might need dose reduction if lightheaded due to low BP Will have her set up nurse visit for BP check Will have her set up lab only appt for CBC and CMET Reinforced DASH diet

## 2018-09-25 NOTE — Telephone Encounter (Signed)
[place] Night - Client Nonclinical Telephone Record [facility] Night - Client Client Site [place] Physician Webb Silversmith - NP Contact Type Call Who Is Calling Patient / Member / Family / Caregiver Caller Name [place] Phone Number [phone] Patient Name Hannah Giles Patient DOB [birth year] Call Type Message Only Information Provided Reason for Call Request to [facility] Appointment Initial Comment Caller states she wants to cancel an appointment Additional Comment Call Closed By: Mable Paris Transaction Date/Time: 09/24/2018 6:38:26 PM (ET)

## 2018-09-25 NOTE — Assessment & Plan Note (Signed)
Will have her set up lab only appt for CMET and lipid profile Encouraged her to consume a low fat diet Continue Rosuvastatin and Fenofibrate for now

## 2018-09-25 NOTE — Assessment & Plan Note (Signed)
Support offered today Continue Wellbutrin for now Will monitor

## 2018-09-25 NOTE — Patient Instructions (Signed)
Fat and Cholesterol Restricted Eating Plan Getting too much fat and cholesterol in your diet may cause health problems. Choosing the right foods helps keep your fat and cholesterol at normal levels. This can keep you from getting certain diseases. Your doctor may recommend an eating plan that includes:  Total fat: ______% or less of total calories a day.  Saturated fat: ______% or less of total calories a day.  Cholesterol: less than _________mg a day.  Fiber: ______g a day. What are tips for following this plan? Meal planning  At meals, divide your plate into four equal parts: ? Fill one-half of your plate with vegetables and green salads. ? Fill one-fourth of your plate with whole grains. ? Fill one-fourth of your plate with low-fat (lean) protein foods.  Eat fish that is high in omega-3 fats at least two times a week. This includes mackerel, tuna, sardines, and salmon.  Eat foods that are high in fiber, such as whole grains, beans, apples, broccoli, carrots, peas, and barley. General tips   Work with your doctor to lose weight if you need to.  Avoid: ? Foods with added sugar. ? Fried foods. ? Foods with partially hydrogenated oils.  Limit alcohol intake to no more than 1 drink a day for nonpregnant women and 2 drinks a day for men. One drink equals 12 oz of beer, 5 oz of wine, or 1 oz of hard liquor. Reading food labels  Check food labels for: ? Trans fats. ? Partially hydrogenated oils. ? Saturated fat (g) in each serving. ? Cholesterol (mg) in each serving. ? Fiber (g) in each serving.  Choose foods with healthy fats, such as: ? Monounsaturated fats. ? Polyunsaturated fats. ? Omega-3 fats.  Choose grain products that have whole grains. Look for the word "whole" as the first word in the ingredient list. Cooking  Cook foods using low-fat methods. These include baking, boiling, grilling, and broiling.  Eat more home-cooked foods. Eat at restaurants and buffets  less often.  Avoid cooking using saturated fats, such as butter, cream, palm oil, palm kernel oil, and coconut oil. Recommended foods  Fruits  All fresh, canned (in natural juice), or frozen fruits. Vegetables  Fresh or frozen vegetables (raw, steamed, roasted, or grilled). Green salads. Grains  Whole grains, such as whole wheat or whole grain breads, crackers, cereals, and pasta. Unsweetened oatmeal, bulgur, barley, quinoa, or brown rice. Corn or whole wheat flour tortillas. Meats and other protein foods  Ground beef (85% or leaner), grass-fed beef, or beef trimmed of fat. Skinless chicken or turkey. Ground chicken or turkey. Pork trimmed of fat. All fish and seafood. Egg whites. Dried beans, peas, or lentils. Unsalted nuts or seeds. Unsalted canned beans. Nut butters without added sugar or oil. Dairy  Low-fat or nonfat dairy products, such as skim or 1% milk, 2% or reduced-fat cheeses, low-fat and fat-free ricotta or cottage cheese, or plain low-fat and nonfat yogurt. Fats and oils  Tub margarine without trans fats. Light or reduced-fat mayonnaise and salad dressings. Avocado. Olive, canola, sesame, or safflower oils. The items listed above may not be a complete list of foods and beverages you can eat. Contact a dietitian for more information. Foods to avoid Fruits  Canned fruit in heavy syrup. Fruit in cream or butter sauce. Fried fruit. Vegetables  Vegetables cooked in cheese, cream, or butter sauce. Fried vegetables. Grains  White bread. White pasta. White rice. Cornbread. Bagels, pastries, and croissants. Crackers and snack foods that contain trans fat   and hydrogenated oils. Meats and other protein foods  Fatty cuts of meat. Ribs, chicken wings, bacon, sausage, bologna, salami, chitterlings, fatback, hot dogs, bratwurst, and packaged lunch meats. Liver and organ meats. Whole eggs and egg yolks. Chicken and turkey with skin. Fried meat. Dairy  Whole or 2% milk, cream,  half-and-half, and cream cheese. Whole milk cheeses. Whole-fat or sweetened yogurt. Full-fat cheeses. Nondairy creamers and whipped toppings. Processed cheese, cheese spreads, and cheese curds. Beverages  Alcohol. Sugar-sweetened drinks such as sodas, lemonade, and fruit drinks. Fats and oils  Butter, stick margarine, lard, shortening, ghee, or bacon fat. Coconut, palm kernel, and palm oils. Sweets and desserts  Corn syrup, sugars, honey, and molasses. Candy. Jam and jelly. Syrup. Sweetened cereals. Cookies, pies, cakes, donuts, muffins, and ice cream. The items listed above may not be a complete list of foods and beverages you should avoid. Contact a dietitian for more information. Summary  Choosing the right foods helps keep your fat and cholesterol at normal levels. This can keep you from getting certain diseases.  At meals, fill one-half of your plate with vegetables and green salads.  Eat high-fiber foods, like whole grains, beans, apples, carrots, peas, and barley.  Limit added sugar, saturated fats, alcohol, and fried foods. This information is not intended to replace advice given to you by your health care provider. Make sure you discuss any questions you have with your health care provider. Document Released: 11/20/2011 Document Revised: 01/22/2018 Document Reviewed: 02/05/2017 Elsevier Interactive Patient Education  2019 Elsevier Inc.   

## 2018-10-02 ENCOUNTER — Ambulatory Visit: Payer: 59

## 2018-10-02 ENCOUNTER — Other Ambulatory Visit: Payer: Self-pay

## 2018-10-02 ENCOUNTER — Other Ambulatory Visit (INDEPENDENT_AMBULATORY_CARE_PROVIDER_SITE_OTHER): Payer: 59

## 2018-10-02 ENCOUNTER — Ambulatory Visit: Payer: Self-pay

## 2018-10-02 VITALS — BP 134/78

## 2018-10-02 DIAGNOSIS — E119 Type 2 diabetes mellitus without complications: Secondary | ICD-10-CM | POA: Diagnosis not present

## 2018-10-02 DIAGNOSIS — I10 Essential (primary) hypertension: Secondary | ICD-10-CM

## 2018-10-02 DIAGNOSIS — E7800 Pure hypercholesterolemia, unspecified: Secondary | ICD-10-CM

## 2018-10-02 DIAGNOSIS — F4323 Adjustment disorder with mixed anxiety and depressed mood: Secondary | ICD-10-CM

## 2018-10-02 LAB — COMPREHENSIVE METABOLIC PANEL
ALT: 14 U/L (ref 0–35)
AST: 16 U/L (ref 0–37)
Albumin: 4.1 g/dL (ref 3.5–5.2)
Alkaline Phosphatase: 71 U/L (ref 39–117)
BUN: 13 mg/dL (ref 6–23)
CO2: 33 mEq/L — ABNORMAL HIGH (ref 19–32)
Calcium: 9.4 mg/dL (ref 8.4–10.5)
Chloride: 98 mEq/L (ref 96–112)
Creatinine, Ser: 0.66 mg/dL (ref 0.40–1.20)
GFR: 91.48 mL/min (ref >60.00)
Glucose, Bld: 122 mg/dL — ABNORMAL HIGH (ref 70–99)
Potassium: 3.9 mEq/L (ref 3.5–5.1)
Sodium: 140 mEq/L (ref 135–145)
Total Bilirubin: 0.4 mg/dL (ref 0.2–1.2)
Total Protein: 6.9 g/dL (ref 6.0–8.3)

## 2018-10-02 LAB — CBC
HCT: 39.7 % (ref 36.0–46.0)
Hemoglobin: 13.6 g/dL (ref 12.0–15.0)
MCHC: 34.3 g/dL (ref 30.0–36.0)
MCV: 86.3 fl (ref 78.0–100.0)
Platelets: 188 10*3/uL (ref 150.0–400.0)
RBC: 4.6 Mil/uL (ref 3.87–5.11)
RDW: 14.4 % (ref 11.5–15.5)
WBC: 9.7 10*3/uL (ref 4.0–10.5)

## 2018-10-02 LAB — LIPID PANEL
Cholesterol: 129 mg/dL (ref 0–200)
HDL: 37 mg/dL — ABNORMAL LOW (ref >39.00)
LDL Cholesterol: 66 mg/dL (ref 0–99)
NonHDL: 91.9
Total CHOL/HDL Ratio: 3
Triglycerides: 129 mg/dL (ref 0.0–149.0)
VLDL: 25.8 mg/dL (ref 0.0–40.0)

## 2018-10-02 LAB — HEMOGLOBIN A1C: Hgb A1c MFr Bld: 6 % (ref 4.6–6.5)

## 2018-10-02 NOTE — Progress Notes (Signed)
Patient had a nurse visit for a BP check. BP was 134 78

## 2018-10-03 ENCOUNTER — Encounter: Payer: Self-pay | Admitting: Internal Medicine

## 2018-10-03 MED ORDER — FENOFIBRATE 54 MG PO TABS: 54.0000 mg | ORAL_TABLET | Freq: Every day | ORAL | 0 refills | Status: DC

## 2018-10-03 NOTE — Progress Notes (Signed)
BP slightly elevated. See if she would be willing to start Lisinopril 10 mg for better BP control and for kidney protection. If agreeable send in #30, 0 refills. Repeat BP in 2 weeks Webb Silversmith, NP

## 2018-10-03 NOTE — Addendum Note (Signed)
Addended by: Ellamae Sia on: 10/03/2018 03:00 PM   Modules accepted: Orders

## 2018-10-03 NOTE — Addendum Note (Signed)
Addended by: Lurlean Nanny on: 10/03/2018 12:27 PM   Modules accepted: Orders

## 2018-10-06 MED ORDER — LISINOPRIL 10 MG PO TABS: 10.0000 mg | ORAL_TABLET | Freq: Every day | ORAL | 0 refills | Status: DC

## 2018-10-06 NOTE — Addendum Note (Signed)
Addended by: Lurlean Nanny on: 10/06/2018 12:19 PM   Modules accepted: Orders

## 2018-10-07 ENCOUNTER — Encounter: Payer: Self-pay | Admitting: Internal Medicine

## 2018-10-07 MED ORDER — GLIPIZIDE 5 MG PO TABS: 5.0000 mg | ORAL_TABLET | Freq: Two times a day (BID) | ORAL | 1 refills | Status: DC

## 2018-10-07 MED ORDER — ATENOLOL-CHLORTHALIDONE 50-25 MG PO TABS: 1.0000 | ORAL_TABLET | Freq: Every day | ORAL | 1 refills | Status: DC

## 2018-10-07 MED ORDER — BUPROPION HCL ER (XL) 150 MG PO TB24: 150.0000 mg | ORAL_TABLET | Freq: Every day | ORAL | 1 refills | Status: DC

## 2018-10-07 NOTE — Addendum Note (Signed)
Addended by: Lurlean Nanny on: 10/07/2018 09:35 AM   Modules accepted: Orders

## 2018-10-08 ENCOUNTER — Encounter: Payer: Self-pay | Admitting: Internal Medicine

## 2018-10-08 DIAGNOSIS — E119 Type 2 diabetes mellitus without complications: Secondary | ICD-10-CM

## 2018-10-08 MED ORDER — FREESTYLE LANCETS MISC: 1.0000 | Freq: Two times a day (BID) | 2 refills | Status: DC | PRN

## 2018-10-20 ENCOUNTER — Encounter: Payer: Self-pay | Admitting: Internal Medicine

## 2018-10-28 ENCOUNTER — Encounter: Payer: Self-pay | Admitting: Internal Medicine

## 2018-11-11 ENCOUNTER — Encounter: Payer: Self-pay | Admitting: Internal Medicine

## 2018-11-11 MED ORDER — LISINOPRIL 5 MG PO TABS: 5.0000 mg | ORAL_TABLET | Freq: Every day | ORAL | 0 refills | Status: DC

## 2018-11-16 ENCOUNTER — Other Ambulatory Visit: Payer: Self-pay | Admitting: Internal Medicine

## 2018-11-17 MED ORDER — ROSUVASTATIN CALCIUM 10 MG PO TABS: 10.0000 mg | ORAL_TABLET | Freq: Every day | ORAL | 0 refills | Status: DC

## 2018-12-29 ENCOUNTER — Encounter: Payer: Self-pay | Admitting: Internal Medicine

## 2018-12-29 MED ORDER — FREESTYLE LITE TEST VI STRP: 1.0000 | ORAL_STRIP | Freq: Two times a day (BID) | 2 refills | Status: DC

## 2019-01-01 ENCOUNTER — Encounter: Payer: Self-pay | Admitting: Internal Medicine

## 2019-01-01 DIAGNOSIS — H524 Presbyopia: Secondary | ICD-10-CM | POA: Diagnosis not present

## 2019-01-01 DIAGNOSIS — E119 Type 2 diabetes mellitus without complications: Secondary | ICD-10-CM | POA: Diagnosis not present

## 2019-01-01 DIAGNOSIS — H52223 Regular astigmatism, bilateral: Secondary | ICD-10-CM | POA: Diagnosis not present

## 2019-01-01 DIAGNOSIS — H5211 Myopia, right eye: Secondary | ICD-10-CM | POA: Diagnosis not present

## 2019-01-01 LAB — HM DIABETES EYE EXAM

## 2019-01-02 ENCOUNTER — Encounter: Payer: Self-pay | Admitting: Internal Medicine

## 2019-01-02 MED ORDER — GLIPIZIDE 5 MG PO TABS: 5.0000 mg | ORAL_TABLET | Freq: Two times a day (BID) | ORAL | 0 refills | Status: DC

## 2019-01-02 MED ORDER — FENOFIBRATE 54 MG PO TABS: 54.0000 mg | ORAL_TABLET | Freq: Every day | ORAL | 0 refills | Status: DC

## 2019-01-02 MED ORDER — LISINOPRIL 5 MG PO TABS: 5.0000 mg | ORAL_TABLET | Freq: Every day | ORAL | 0 refills | Status: DC

## 2019-02-02 ENCOUNTER — Encounter: Payer: Self-pay | Admitting: Internal Medicine

## 2019-02-13 ENCOUNTER — Other Ambulatory Visit: Payer: Self-pay | Admitting: Internal Medicine

## 2019-02-13 MED ORDER — ROSUVASTATIN CALCIUM 10 MG PO TABS: 10.0000 mg | ORAL_TABLET | Freq: Every day | ORAL | 0 refills | Status: DC

## 2019-02-19 ENCOUNTER — Other Ambulatory Visit: Payer: Self-pay

## 2019-02-19 ENCOUNTER — Encounter: Payer: Self-pay | Admitting: Internal Medicine

## 2019-02-19 ENCOUNTER — Ambulatory Visit (INDEPENDENT_AMBULATORY_CARE_PROVIDER_SITE_OTHER): Payer: 59 | Admitting: Internal Medicine

## 2019-02-19 VITALS — BP 126/78 | HR 78 | Temp 98.2°F | Ht 69.0 in | Wt 202.0 lb

## 2019-02-19 DIAGNOSIS — N952 Postmenopausal atrophic vaginitis: Secondary | ICD-10-CM | POA: Diagnosis not present

## 2019-02-19 DIAGNOSIS — F4323 Adjustment disorder with mixed anxiety and depressed mood: Secondary | ICD-10-CM | POA: Diagnosis not present

## 2019-02-19 DIAGNOSIS — E119 Type 2 diabetes mellitus without complications: Secondary | ICD-10-CM

## 2019-02-19 DIAGNOSIS — I10 Essential (primary) hypertension: Secondary | ICD-10-CM | POA: Diagnosis not present

## 2019-02-19 DIAGNOSIS — E7800 Pure hypercholesterolemia, unspecified: Secondary | ICD-10-CM | POA: Diagnosis not present

## 2019-02-19 DIAGNOSIS — Z1159 Encounter for screening for other viral diseases: Secondary | ICD-10-CM | POA: Diagnosis not present

## 2019-02-19 DIAGNOSIS — Z6829 Body mass index (BMI) 29.0-29.9, adult: Secondary | ICD-10-CM | POA: Diagnosis not present

## 2019-02-19 DIAGNOSIS — Z114 Encounter for screening for human immunodeficiency virus [HIV]: Secondary | ICD-10-CM

## 2019-02-19 DIAGNOSIS — Z0000 Encounter for general adult medical examination without abnormal findings: Secondary | ICD-10-CM | POA: Diagnosis not present

## 2019-02-19 DIAGNOSIS — Z01419 Encounter for gynecological examination (general) (routine) without abnormal findings: Secondary | ICD-10-CM | POA: Diagnosis not present

## 2019-02-19 DIAGNOSIS — L900 Lichen sclerosus et atrophicus: Secondary | ICD-10-CM | POA: Diagnosis not present

## 2019-02-19 DIAGNOSIS — Z1231 Encounter for screening mammogram for malignant neoplasm of breast: Secondary | ICD-10-CM | POA: Diagnosis not present

## 2019-02-19 DIAGNOSIS — R32 Unspecified urinary incontinence: Secondary | ICD-10-CM | POA: Diagnosis not present

## 2019-02-19 LAB — COMPREHENSIVE METABOLIC PANEL
ALT: 13 U/L (ref 0–35)
AST: 16 U/L (ref 0–37)
Albumin: 4.4 g/dL (ref 3.5–5.2)
Alkaline Phosphatase: 81 U/L (ref 39–117)
BUN: 17 mg/dL (ref 6–23)
CO2: 34 mEq/L — ABNORMAL HIGH (ref 19–32)
Calcium: 10.1 mg/dL (ref 8.4–10.5)
Chloride: 100 mEq/L (ref 96–112)
Creatinine, Ser: 0.63 mg/dL (ref 0.40–1.20)
GFR: 96.4 mL/min (ref >60.00)
Glucose, Bld: 66 mg/dL — ABNORMAL LOW (ref 70–99)
Potassium: 3.8 mEq/L (ref 3.5–5.1)
Sodium: 141 mEq/L (ref 135–145)
Total Bilirubin: 0.5 mg/dL (ref 0.2–1.2)
Total Protein: 6.9 g/dL (ref 6.0–8.3)

## 2019-02-19 LAB — CBC
HCT: 38 % (ref 36.0–46.0)
Hemoglobin: 13 g/dL (ref 12.0–15.0)
MCHC: 34.3 g/dL (ref 30.0–36.0)
MCV: 87.2 fl (ref 78.0–100.0)
Platelets: 205 10*3/uL (ref 150.0–400.0)
RBC: 4.36 Mil/uL (ref 3.87–5.11)
RDW: 14.1 % (ref 11.5–15.5)
WBC: 9.2 10*3/uL (ref 4.0–10.5)

## 2019-02-19 LAB — HEMOGLOBIN A1C: Hgb A1c MFr Bld: 5.2 % (ref 4.6–6.5)

## 2019-02-19 LAB — LIPID PANEL
Cholesterol: 123 mg/dL (ref 0–200)
HDL: 40.9 mg/dL (ref >39.00)
LDL Cholesterol: 62 mg/dL (ref 0–99)
NonHDL: 82.32
Total CHOL/HDL Ratio: 3
Triglycerides: 104 mg/dL (ref 0.0–149.0)
VLDL: 20.8 mg/dL (ref 0.0–40.0)

## 2019-02-19 LAB — VITAMIN D 25 HYDROXY (VIT D DEFICIENCY, FRACTURES): VITD: 49.42 ng/mL (ref 30.00–100.00)

## 2019-02-19 NOTE — Assessment & Plan Note (Signed)
CMET and Lipid profile today Encouraged low fat diet Continue Rosuvastatin and Fenofibrate

## 2019-02-19 NOTE — Assessment & Plan Note (Signed)
Controlled on Atenolol-Chlorthalidone and Lisinopril Reinforced DASH diet and exercise for weight loss Will monitor

## 2019-02-19 NOTE — Assessment & Plan Note (Signed)
Stable on Wellbutrin Will continue for now

## 2019-02-19 NOTE — Assessment & Plan Note (Signed)
A1C today No microalbumin secondary to ACEI therapy Encouraged low carb diet and exercise for weight loss Continue Glipizide Foot exam today Eye exam UTD-abstracted Flu and Pneumovax UTD

## 2019-02-19 NOTE — Patient Instructions (Signed)

## 2019-02-19 NOTE — Progress Notes (Signed)
Subjective:    Patient ID: Hannah Giles, female    DOB: 10/12/1960, 58 y.o.   MRN: [id number]  HPI  Pt presents to the clinic today for her annual exam. She is also due to follow up chronic conditions.  HTN: Her BP today is 126/78. She is taking Atenolol-Chlorthalidone and Lisinopril as prescribed. There is no ECG on file.  HLD: Her last LDL was 66, 09/2018. She denies myalgias on Rosuvastatin. She is taking Fenofibrate as well. She tries to consume a low fat diet.  DM 2: Her last A1C was 6%, 09/2018. She is taking Glipizide as prescribed. Her blood sugars range 80-90's. She checks her feet routinely.   Anxiety and Depression: Chronic but stable on Wellbutrin. She is not currently seeing a therapist. She denies SI/HI.  Flu: 02/2019 Tetanus: 10/2009 Pneumovax: 01/2015 Pap Smear: 02/2018 Mammogram: 02/2019 Bone Density: unsure Colon Screening: 04/2014 Vision Screening: annually Dentist: biannually  Diet: She does eat meat. She consumes fruits and veggies daily. She tries to avoid fried foods. She drinks mostly water. Exercise: None  Review of Systems      Past Medical History:  Diagnosis Date  . Hyperlipidemia   . Hypertension   . Urinary incontinence     Current Outpatient Medications  Medication Sig Dispense Refill  . atenolol-chlorthalidone (TENORETIC) 50-25 MG tablet Take 1 tablet by mouth daily. 90 tablet 1  . buPROPion (WELLBUTRIN XL) 150 MG 24 hr tablet Take 1 tablet (150 mg total) by mouth daily. 90 tablet 1  . Cholecalciferol (VITAMIN D) 50 MCG (2000 UT) tablet Take 4,000 Units by mouth daily.    . fenofibrate 54 MG tablet Take 1 tablet (54 mg total) by mouth daily. 90 tablet 0  . glipiZIDE (GLUCOTROL) 5 MG tablet Take 1 tablet (5 mg total) by mouth 2 (two) times daily before a meal. 180 tablet 0  . glucose blood (FREESTYLE LITE) test strip 1 each by Other route 2 (two) times daily. 100 each 2  . INTRAROSA 6.5 MG INST Place 1 suppository vaginally at bedtime.  2  .  Lancets (FREESTYLE) lancets 1 each by Other route 2 (two) times daily as needed for other. 100 each 2  . lisinopril (ZESTRIL) 5 MG tablet Take 1 tablet (5 mg total) by mouth daily. 90 tablet 0  . polyethylene glycol (MIRALAX / GLYCOLAX) packet Take 17 g by mouth daily.    . rosuvastatin (CRESTOR) 10 MG tablet Take 1 tablet (10 mg total) by mouth daily. 90 tablet 0  . triamcinolone cream (KENALOG) 0.1 %   1   No current facility-administered medications for this visit.     Allergies  Allergen Reactions  . Niacin     REACTION: flushing, SOB face numb    Family History  Problem Relation Age of Onset  . Cancer Mother        colon  . Colon cancer Mother   . Heart disease Father        s/p stents in the 60's  . Dementia Father   . Diabetes Paternal Uncle   . Hypertension Sister   . Pancreatic cancer Paternal Grandfather   . Rectal cancer Neg Hx   . Stomach cancer Neg Hx     Social History   Socioeconomic History  . Marital status: Married    Spouse name: Not on file  . Number of children: Not on file  . Years of education: Not on file  . Highest education level: Not on file  Occupational History  . Occupation: Therapist, sports, [employer]  . Financial resource strain: Not on file  . Food insecurity    Worry: Not on file    Inability: Not on file  . Transportation needs    Medical: Not on file    Non-medical: Not on file  Tobacco Use  . Smoking status: Former Smoker    Packs/day: 0.50    Years: 13.00    Pack years: 6.50    Types: Cigarettes    Quit date: 06/04/1989    Years since quitting: 29.7  . Smokeless tobacco: Never Used  Substance and Sexual Activity  . Alcohol use: Yes    Alcohol/week: 3.0 - 4.0 standard drinks    Types: 3 - 4 Glasses of wine per week    Comment: socially  . Drug use: No  . Sexual activity: Not on file  Lifestyle  . Physical activity    Days per week: Not on file    Minutes per session: Not on file  . Stress: Not on file   Relationships  . Social Herbalist on phone: Not on file    Gets together: Not on file    Attends religious service: Not on file    Active member of club or organization: Not on file    Attends meetings of clubs or organizations: Not on file    Relationship status: Not on file  . Intimate partner violence    Fear of current or ex partner: Not on file    Emotionally abused: Not on file    Physically abused: Not on file    Forced sexual activity: Not on file  Other Topics Concern  . Not on file  Social History Narrative  . Not on file     Constitutional: Pt reports intentional weight loss. Denies fever, malaise, fatigue, headache.  HEENT: Denies eye pain, eye redness, ear pain, ringing in the ears, wax buildup, runny nose, nasal congestion, bloody nose, or sore throat. Respiratory: Denies difficulty breathing, shortness of breath, cough or sputum production.   Cardiovascular: Denies chest pain, chest tightness, palpitations or swelling in the hands or feet.  Gastrointestinal: Denies abdominal pain, bloating, constipation, diarrhea or blood in the stool.  GU: Denies urgency, frequency, pain with urination, burning sensation, blood in urine, odor or discharge. Musculoskeletal: Denies decrease in range of motion, difficulty with gait, muscle pain or joint pain and swelling.  Skin: Denies redness, rashes, lesions or ulcercations.  Neurological: Denies dizziness, difficulty with memory, difficulty with speech or problems with balance and coordination.  Psych: Pt has a history of anxiety and depression. Denies SI/HI.  No other specific complaints in a complete review of systems (except as listed in HPI above).  Objective:   Physical Exam  BP 126/78   Pulse 78   Temp 98.2 F (36.8 C) (Temporal)   Ht 5\' 9"  (1.753 m)   Wt 202 lb (91.6 kg)   SpO2 98%   BMI 29.83 kg/m  Wt Readings from Last 3 Encounters:  02/19/19 202 lb (91.6 kg)  09/25/18 223 lb 8 oz (101.4 kg)   06/25/18 250 lb (113.4 kg)    General: Appears her stated age, well developed, well nourished in NAD. Skin: Warm, dry and intact. No ulcerations noted. HEENT: Head: normal shape and size; Eyes: sclera white, no icterus, conjunctiva pink, PERRLA and EOMs intact; Ears: Tm's gray and intact, normal light reflex;  Neck:  Neck supple, trachea midline. No masses,  lumps or thyromegaly present.  Cardiovascular: Normal rate and rhythm. S1,S2 noted.  No murmur, rubs or gallops noted. No JVD or BLE edema. No carotid bruits noted. Pulmonary/Chest: Normal effort and positive vesicular breath sounds. No respiratory distress. No wheezes, rales or ronchi noted.  Abdomen: Soft and nontender. Normal bowel sounds. No distention or masses noted. Liver, spleen and kidneys non palpable. Musculoskeletal: Strength 5/5  BUE/BLE. No difficulty with gait.  Neurological: Alert and oriented. Cranial nerves II-XII grossly intact. Coordination normal.  Psychiatric: Mood and affect normal. Behavior is normal. Judgment and thought content normal.     BMET    Component Value Date/Time   NA 140 10/02/2018 0857   NA 138 09/01/2013 0017   K 3.9 10/02/2018 0857   K 3.0 (L) 09/01/2013 0017   CL 98 10/02/2018 0857   CL 100 09/01/2013 0017   CO2 33 (H) 10/02/2018 0857   CO2 33 (H) 09/01/2013 0017   GLUCOSE 122 (H) 10/02/2018 0857   GLUCOSE 208 (H) 09/01/2013 0017   BUN 13 10/02/2018 0857   BUN 18 09/01/2013 0017   CREATININE 0.66 10/02/2018 0857   CREATININE 0.59 01/16/2017 0945   CALCIUM 9.4 10/02/2018 0857   CALCIUM 9.3 09/01/2013 0017   GFRNONAA >60 09/01/2013 0017   GFRAA >60 09/01/2013 0017    Lipid Panel     Component Value Date/Time   CHOL 129 10/02/2018 0857   TRIG 129.0 10/02/2018 0857   HDL 37.00 (L) 10/02/2018 0857   CHOLHDL 3 10/02/2018 0857   VLDL 25.8 10/02/2018 0857   LDLCALC 66 10/02/2018 0857    CBC    Component Value Date/Time   WBC 9.7 10/02/2018 0857   RBC 4.60 10/02/2018 0857    HGB 13.6 10/02/2018 0857   HGB 13.8 09/01/2013 0017   HCT 39.7 10/02/2018 0857   HCT 40.9 09/01/2013 0017   PLT 188.0 10/02/2018 0857   PLT 216 09/01/2013 0017   MCV 86.3 10/02/2018 0857   MCV 85 09/01/2013 0017   MCH 28.6 04/01/2015 0738   MCHC 34.3 10/02/2018 0857   RDW 14.4 10/02/2018 0857   RDW 13.9 09/01/2013 0017   LYMPHSABS 2.3 04/01/2015 0738   MONOABS 0.3 04/01/2015 0738   EOSABS 0.1 04/01/2015 0738   BASOSABS 0.0 04/01/2015 0738    Hgb A1C Lab Results  Component Value Date   HGBA1C 6.0 10/02/2018            Assessment & Plan:   Preventative Health Maintenance:  Flu shot UTD Tetanus UTD Pneumovax UTD Pap smear UTD Mammogram UTD Bone density UTD per her record, will request copy from :[facility] Colon screening UTD Encouraged her to consume a balanced diet and exercise regimen Advised her to see an eye doctor and dentist annually Will check CBC, CMET, Lipid, A1C and Vit D today  RTC in 6 months for follow up chronic conditions Webb Silversmith, NP

## 2019-02-20 ENCOUNTER — Encounter: Payer: Self-pay | Admitting: Internal Medicine

## 2019-02-20 DIAGNOSIS — E119 Type 2 diabetes mellitus without complications: Secondary | ICD-10-CM

## 2019-02-21 LAB — HEPATITIS C ANTIBODY
Hepatitis C Ab: NONREACTIVE
SIGNAL TO CUT-OFF: 0.01 (ref <1.00)

## 2019-02-21 LAB — HIV ANTIBODY (ROUTINE TESTING W REFLEX): HIV 1&2 Ab, 4th Generation: NONREACTIVE

## 2019-03-15 ENCOUNTER — Encounter: Payer: Self-pay | Admitting: Internal Medicine

## 2019-03-31 ENCOUNTER — Other Ambulatory Visit: Payer: Self-pay | Admitting: Internal Medicine

## 2019-03-31 ENCOUNTER — Encounter: Payer: Self-pay | Admitting: Internal Medicine

## 2019-03-31 MED ORDER — LISINOPRIL 5 MG PO TABS: 5.0000 mg | ORAL_TABLET | Freq: Every day | ORAL | 2 refills | Status: DC

## 2019-03-31 MED ORDER — FENOFIBRATE 54 MG PO TABS: 54.0000 mg | ORAL_TABLET | Freq: Every day | ORAL | 1 refills | Status: DC

## 2019-04-01 MED ORDER — ATENOLOL-CHLORTHALIDONE 50-25 MG PO TABS: 1.0000 | ORAL_TABLET | Freq: Every day | ORAL | 1 refills | Status: DC

## 2019-04-17 ENCOUNTER — Encounter: Payer: Self-pay | Admitting: Internal Medicine

## 2019-05-05 ENCOUNTER — Encounter: Payer: Self-pay | Admitting: Internal Medicine

## 2019-05-21 ENCOUNTER — Other Ambulatory Visit (INDEPENDENT_AMBULATORY_CARE_PROVIDER_SITE_OTHER): Payer: 59

## 2019-05-21 ENCOUNTER — Other Ambulatory Visit: Payer: Self-pay

## 2019-05-21 ENCOUNTER — Encounter: Payer: Self-pay | Admitting: Internal Medicine

## 2019-05-21 DIAGNOSIS — E119 Type 2 diabetes mellitus without complications: Secondary | ICD-10-CM | POA: Diagnosis not present

## 2019-05-21 DIAGNOSIS — I10 Essential (primary) hypertension: Secondary | ICD-10-CM

## 2019-05-21 LAB — POCT GLYCOSYLATED HEMOGLOBIN (HGB A1C): Hemoglobin A1C: 5.1 % (ref 4.0–5.6)

## 2019-05-21 LAB — MICROALBUMIN / CREATININE URINE RATIO
Creatinine,U: 87.7 mg/dL
Microalb Creat Ratio: 0.9 mg/g (ref 0.0–30.0)
Microalb, Ur: 0.8 mg/dL (ref 0.0–1.9)

## 2019-05-31 ENCOUNTER — Encounter: Payer: Self-pay | Admitting: Internal Medicine

## 2019-06-01 MED ORDER — ROSUVASTATIN CALCIUM 10 MG PO TABS: 10.0000 mg | ORAL_TABLET | Freq: Every day | ORAL | 0 refills | Status: DC

## 2019-06-10 ENCOUNTER — Encounter: Payer: Self-pay | Admitting: Internal Medicine

## 2019-06-18 ENCOUNTER — Encounter: Payer: Self-pay | Admitting: Internal Medicine

## 2019-06-18 DIAGNOSIS — E7800 Pure hypercholesterolemia, unspecified: Secondary | ICD-10-CM

## 2019-06-18 DIAGNOSIS — E119 Type 2 diabetes mellitus without complications: Secondary | ICD-10-CM

## 2019-07-29 ENCOUNTER — Encounter: Payer: Self-pay | Admitting: Internal Medicine

## 2019-08-19 ENCOUNTER — Other Ambulatory Visit: Payer: 59

## 2019-08-20 ENCOUNTER — Other Ambulatory Visit: Payer: Self-pay

## 2019-08-20 ENCOUNTER — Other Ambulatory Visit (INDEPENDENT_AMBULATORY_CARE_PROVIDER_SITE_OTHER): Payer: 59

## 2019-08-20 DIAGNOSIS — E119 Type 2 diabetes mellitus without complications: Secondary | ICD-10-CM

## 2019-08-20 DIAGNOSIS — E7800 Pure hypercholesterolemia, unspecified: Secondary | ICD-10-CM | POA: Diagnosis not present

## 2019-08-20 LAB — LIPID PANEL
Cholesterol: 139 mg/dL (ref 0–200)
HDL: 47.3 mg/dL (ref >39.00)
LDL Cholesterol: 71 mg/dL (ref 0–99)
NonHDL: 91.89
Total CHOL/HDL Ratio: 3
Triglycerides: 106 mg/dL (ref 0.0–149.0)
VLDL: 21.2 mg/dL (ref 0.0–40.0)

## 2019-08-20 LAB — HEMOGLOBIN A1C: Hgb A1c MFr Bld: 5.2 % (ref 4.6–6.5)

## 2019-08-25 ENCOUNTER — Other Ambulatory Visit: Payer: Self-pay | Admitting: Internal Medicine

## 2019-08-25 ENCOUNTER — Encounter: Payer: Self-pay | Admitting: Internal Medicine

## 2019-08-25 MED ORDER — ROSUVASTATIN CALCIUM 10 MG PO TABS: 10.0000 mg | ORAL_TABLET | Freq: Every day | ORAL | 0 refills | Status: DC

## 2019-09-17 DIAGNOSIS — D2271 Melanocytic nevi of right lower limb, including hip: Secondary | ICD-10-CM | POA: Diagnosis not present

## 2019-09-17 DIAGNOSIS — L900 Lichen sclerosus et atrophicus: Secondary | ICD-10-CM | POA: Diagnosis not present

## 2019-09-17 DIAGNOSIS — D225 Melanocytic nevi of trunk: Secondary | ICD-10-CM | POA: Diagnosis not present

## 2019-09-17 DIAGNOSIS — L718 Other rosacea: Secondary | ICD-10-CM | POA: Diagnosis not present

## 2019-09-17 DIAGNOSIS — D2261 Melanocytic nevi of right upper limb, including shoulder: Secondary | ICD-10-CM | POA: Diagnosis not present

## 2019-09-17 DIAGNOSIS — D2272 Melanocytic nevi of left lower limb, including hip: Secondary | ICD-10-CM | POA: Diagnosis not present

## 2019-09-17 DIAGNOSIS — D2262 Melanocytic nevi of left upper limb, including shoulder: Secondary | ICD-10-CM | POA: Diagnosis not present

## 2019-09-17 DIAGNOSIS — L218 Other seborrheic dermatitis: Secondary | ICD-10-CM | POA: Diagnosis not present

## 2019-10-06 ENCOUNTER — Encounter: Payer: Self-pay | Admitting: Internal Medicine

## 2019-10-06 MED ORDER — ATENOLOL-CHLORTHALIDONE 50-25 MG PO TABS: 1.0000 | ORAL_TABLET | Freq: Every day | ORAL | 0 refills | Status: DC

## 2019-11-16 ENCOUNTER — Other Ambulatory Visit: Payer: Self-pay | Admitting: Internal Medicine

## 2019-11-17 MED ORDER — ATENOLOL-CHLORTHALIDONE 50-25 MG PO TABS: 1.0000 | ORAL_TABLET | Freq: Every day | ORAL | 0 refills | Status: DC

## 2019-11-17 MED ORDER — ROSUVASTATIN CALCIUM 10 MG PO TABS: 10.0000 mg | ORAL_TABLET | Freq: Every day | ORAL | 0 refills | Status: DC

## 2019-12-27 ENCOUNTER — Encounter: Payer: Self-pay | Admitting: Internal Medicine

## 2019-12-28 MED ORDER — ATENOLOL-CHLORTHALIDONE 50-25 MG PO TABS: 1.0000 | ORAL_TABLET | Freq: Every day | ORAL | 0 refills | Status: DC

## 2019-12-28 MED ORDER — LISINOPRIL 5 MG PO TABS: 5.0000 mg | ORAL_TABLET | Freq: Every day | ORAL | 0 refills | Status: DC

## 2020-01-14 DIAGNOSIS — H5211 Myopia, right eye: Secondary | ICD-10-CM | POA: Diagnosis not present

## 2020-01-14 DIAGNOSIS — E119 Type 2 diabetes mellitus without complications: Secondary | ICD-10-CM | POA: Diagnosis not present

## 2020-01-14 DIAGNOSIS — H52223 Regular astigmatism, bilateral: Secondary | ICD-10-CM | POA: Diagnosis not present

## 2020-01-14 DIAGNOSIS — H524 Presbyopia: Secondary | ICD-10-CM | POA: Diagnosis not present

## 2020-02-19 ENCOUNTER — Encounter: Payer: Self-pay | Admitting: Internal Medicine

## 2020-02-23 ENCOUNTER — Other Ambulatory Visit: Payer: Self-pay | Admitting: Internal Medicine

## 2020-02-24 MED ORDER — ROSUVASTATIN CALCIUM 10 MG PO TABS: 10.0000 mg | ORAL_TABLET | Freq: Every day | ORAL | 0 refills | Status: DC

## 2020-02-25 ENCOUNTER — Encounter: Payer: Self-pay | Admitting: Internal Medicine

## 2020-02-25 ENCOUNTER — Other Ambulatory Visit: Payer: Self-pay

## 2020-02-25 ENCOUNTER — Ambulatory Visit (INDEPENDENT_AMBULATORY_CARE_PROVIDER_SITE_OTHER): Payer: 59 | Admitting: Internal Medicine

## 2020-02-25 VITALS — BP 122/78 | HR 60 | Temp 98.1°F | Ht 69.33 in | Wt 186.0 lb

## 2020-02-25 DIAGNOSIS — E7800 Pure hypercholesterolemia, unspecified: Secondary | ICD-10-CM | POA: Diagnosis not present

## 2020-02-25 DIAGNOSIS — Z23 Encounter for immunization: Secondary | ICD-10-CM | POA: Diagnosis not present

## 2020-02-25 DIAGNOSIS — L900 Lichen sclerosus et atrophicus: Secondary | ICD-10-CM | POA: Diagnosis not present

## 2020-02-25 DIAGNOSIS — Z0000 Encounter for general adult medical examination without abnormal findings: Secondary | ICD-10-CM | POA: Diagnosis not present

## 2020-02-25 DIAGNOSIS — E119 Type 2 diabetes mellitus without complications: Secondary | ICD-10-CM | POA: Diagnosis not present

## 2020-02-25 DIAGNOSIS — I10 Essential (primary) hypertension: Secondary | ICD-10-CM | POA: Diagnosis not present

## 2020-02-25 DIAGNOSIS — F4323 Adjustment disorder with mixed anxiety and depressed mood: Secondary | ICD-10-CM

## 2020-02-25 LAB — LIPID PANEL
Cholesterol: 152 mg/dL (ref 0–200)
HDL: 52.8 mg/dL (ref >39.00)
LDL Cholesterol: 83 mg/dL (ref 0–99)
NonHDL: 99.29
Total CHOL/HDL Ratio: 3
Triglycerides: 80 mg/dL (ref 0.0–149.0)
VLDL: 16 mg/dL (ref 0.0–40.0)

## 2020-02-25 LAB — CBC
HCT: 42.5 % (ref 36.0–46.0)
Hemoglobin: 14.6 g/dL (ref 12.0–15.0)
MCHC: 34.3 g/dL (ref 30.0–36.0)
MCV: 90.1 fl (ref 78.0–100.0)
Platelets: 171 10*3/uL (ref 150.0–400.0)
RBC: 4.72 Mil/uL (ref 3.87–5.11)
RDW: 12.8 % (ref 11.5–15.5)
WBC: 9.6 10*3/uL (ref 4.0–10.5)

## 2020-02-25 LAB — COMPREHENSIVE METABOLIC PANEL
ALT: 15 U/L (ref 0–35)
AST: 19 U/L (ref 0–37)
Albumin: 4.4 g/dL (ref 3.5–5.2)
Alkaline Phosphatase: 61 U/L (ref 39–117)
BUN: 17 mg/dL (ref 6–23)
CO2: 34 mEq/L — ABNORMAL HIGH (ref 19–32)
Calcium: 9.8 mg/dL (ref 8.4–10.5)
Chloride: 99 mEq/L (ref 96–112)
Creatinine, Ser: 0.54 mg/dL (ref 0.40–1.20)
GFR: 114.78 mL/min (ref >60.00)
Glucose, Bld: 97 mg/dL (ref 70–99)
Potassium: 4.6 mEq/L (ref 3.5–5.1)
Sodium: 139 mEq/L (ref 135–145)
Total Bilirubin: 0.4 mg/dL (ref 0.2–1.2)
Total Protein: 7.2 g/dL (ref 6.0–8.3)

## 2020-02-25 LAB — VITAMIN D 25 HYDROXY (VIT D DEFICIENCY, FRACTURES): VITD: 47.18 ng/mL (ref 30.00–100.00)

## 2020-02-25 LAB — HEMOGLOBIN A1C: Hgb A1c MFr Bld: 5.4 % (ref 4.6–6.5)

## 2020-02-25 MED ORDER — CLOBETASOL PROPIONATE 0.05 % EX OINT
1.0000 | TOPICAL_OINTMENT | Freq: Two times a day (BID) | CUTANEOUS | 1 refills | Status: DC
Start: 2020-02-25 — End: 2022-03-22
  Filled 2021-01-31: qty 60, 30d supply, fill #0

## 2020-02-25 NOTE — Progress Notes (Signed)
Subjective:    Patient ID: Hannah Giles, female    DOB: 05-11-1961, 59 y.o.   MRN: [id number]  HPI  Pt presents to the clinic today for her annual exam. She is also due to follow up chronic conditions.  HTN: Her BP today is 122/78.  She is taking Atenolol-Chlorthalidone and Lisinopril as prescribed.  There is no ECG on file.  HLD: Her last LDL was 71, triglycerides 106, 08/2019.  She denies myalgias on Rosuvastatin.  She no longer taking Fenofibrate as prescribed.  She tries to consume a low-fat diet.  DM 2: Her last A1c was 5.2, 08/2019.  She stopped taking Glipizide because she lost 68 lbs. Her blood sugars run < 110 fasting.  She checks her feet routinely.  Her last eye exam was 01/2020.  Anxiety and Depression: Improved, currently not taking Wellbutrin.  She is not seeing a therapist.  She denies SI/HI.  She also would like a refill of Clobetasol cream. She uses this for Lichen Sclerosis. She typically gets this from her GYN but she is out and does not have an appt until November.   Flu: 02/2019 Tetanus: 10/2009 Covid: Pfizer Pneumovax: 01/2015 Pap smear: 06/2017 Mammogram: 06/2017, scheduled 04/2020 Bone Density: ? 2020 Colon screening: 04/2014, 5 years Vision screening: annually Dentist: biannually  Diet: She does eat meat. She consumes fruits and veggies. She tries to avoid fried foods. She drinks mostly water. Exercise: None   Review of Systems      Past Medical History:  Diagnosis Date  . Hyperlipidemia   . Hypertension   . Urinary incontinence     Current Outpatient Medications  Medication Sig Dispense Refill  . atenolol-chlorthalidone (TENORETIC) 50-25 MG tablet Take 1 tablet by mouth daily. 90 tablet 0  . buPROPion (WELLBUTRIN XL) 150 MG 24 hr tablet Take 1 tablet (150 mg total) by mouth daily. 90 tablet 1  . Cholecalciferol (VITAMIN D) 50 MCG (2000 UT) tablet Take 4,000 Units by mouth daily.    . fenofibrate 54 MG tablet Take 1 tablet (54 mg total) by mouth  daily. 90 tablet 1  . glipiZIDE (GLUCOTROL) 5 MG tablet Take 0.5 tablets (2.5 mg total) by mouth 2 (two) times daily before a meal. 180 tablet 0  . glucose blood (FREESTYLE LITE) test strip 1 each by Other route 2 (two) times daily. 100 each 2  . INTRAROSA 6.5 MG INST Place 1 suppository vaginally at bedtime.  2  . Lancets (FREESTYLE) lancets 1 each by Other route 2 (two) times daily as needed for other. 100 each 2  . lisinopril (ZESTRIL) 5 MG tablet Take 1 tablet (5 mg total) by mouth daily. 90 tablet 0  . polyethylene glycol (MIRALAX / GLYCOLAX) packet Take 17 g by mouth daily.    . rosuvastatin (CRESTOR) 10 MG tablet Take 1 tablet (10 mg total) by mouth daily. 90 tablet 0  . triamcinolone cream (KENALOG) 0.1 %   1   No current facility-administered medications for this visit.    Allergies  Allergen Reactions  . Niacin     REACTION: flushing, SOB face numb    Family History  Problem Relation Age of Onset  . Cancer Mother        colon  . Colon cancer Mother   . Heart disease Father        s/p stents in the 60's  . Dementia Father   . Diabetes Paternal Uncle   . Hypertension Sister   . Pancreatic cancer Paternal  Grandfather   . Rectal cancer Neg Hx   . Stomach cancer Neg Hx     Social History   Socioeconomic History  . Marital status: Married    Spouse name: Not on file  . Number of children: Not on file  . Years of education: Not on file  . Highest education level: Not on file  Occupational History  . Occupation: Therapist, sports, Theme park manager  Tobacco Use  . Smoking status: Former Smoker    Packs/day: 0.50    Years: 13.00    Pack years: 6.50    Types: Cigarettes    Quit date: 06/04/1989    Years since quitting: 30.7  . Smokeless tobacco: Never Used  Vaping Use  . Vaping Use: Never used  Substance and Sexual Activity  . Alcohol use: Yes    Alcohol/week: 3.0 - 4.0 standard drinks    Types: 3 - 4 Glasses of wine per week    Comment: socially  . Drug use: No  . Sexual  activity: Not on file  Other Topics Concern  . Not on file  Social History Narrative  . Not on file   Social Determinants of Health   Financial Resource Strain:   . Difficulty of Paying Living Expenses: Not on file  Food Insecurity:   . Worried About Charity fundraiser in the Last Year: Not on file  . Ran Out of Food in the Last Year: Not on file  Transportation Needs:   . Lack of Transportation (Medical): Not on file  . Lack of Transportation (Non-Medical): Not on file  Physical Activity:   . Days of Exercise per Week: Not on file  . Minutes of Exercise per Session: Not on file  Stress:   . Feeling of Stress : Not on file  Social Connections:   . Frequency of Communication with Friends and Family: Not on file  . Frequency of Social Gatherings with Friends and Family: Not on file  . Attends Religious Services: Not on file  . Active Member of Clubs or Organizations: Not on file  . Attends Archivist Meetings: Not on file  . Marital Status: Not on file  Intimate Partner Violence:   . Fear of Current or Ex-Partner: Not on file  . Emotionally Abused: Not on file  . Physically Abused: Not on file  . Sexually Abused: Not on file     Constitutional: Pt reports 68 lbs weight loss. Denies fever, malaise, fatigue, headache.  HEENT: Denies eye pain, eye redness, ear pain, ringing in the ears, wax buildup, runny nose, nasal congestion, bloody nose, or sore throat. Respiratory: Denies difficulty breathing, shortness of breath, cough or sputum production.   Cardiovascular: Denies chest pain, chest tightness, palpitations or swelling in the hands or feet.  Gastrointestinal: Denies abdominal pain, bloating, constipation, diarrhea or blood in the stool.  GU: Pt reports vaginal itching due to Lichen Sclerosis. Denies urgency, frequency, pain with urination, burning sensation, blood in urine, odor or discharge. Musculoskeletal: Denies decrease in range of motion, difficulty with  gait, muscle pain or joint pain and swelling.  Skin: Denies redness, rashes, lesions or ulcercations.  Neurological: Denies dizziness, difficulty with memory, difficulty with speech or problems with balance and coordination.  Psych: Pt has a history of anxiety and depression, improved off meds. Denies SI/HI.  No other specific complaints in a complete review of systems (except as listed in HPI above).  Objective:   Physical Exam  BP 122/78   Pulse 60  Temp 98.1 F (36.7 C) (Temporal)   Ht 5' 9.33" (1.761 m)   Wt 186 lb (84.4 kg)   SpO2 98%   BMI 27.21 kg/m   Wt Readings from Last 3 Encounters:  02/19/19 202 lb (91.6 kg)  09/25/18 223 lb 8 oz (101.4 kg)  06/25/18 250 lb (113.4 kg)    General: Appears her stated age, well developed, well nourished in NAD. Skin: Warm, dry and intact. No ulcerations noted. HEENT: Head: normal shape and size; Eyes: sclera white, no icterus, conjunctiva pink, PERRLA and EOMs intact; Neck:  Neck supple, trachea midline. No masses, lumps or thyromegaly present.  Cardiovascular: Normal rate and rhythm. S1,S2 noted.  No murmur, rubs or gallops noted. No JVD or BLE edema. No carotid bruits noted. Pulmonary/Chest: Normal effort and positive vesicular breath sounds. No respiratory distress. No wheezes, rales or ronchi noted.  Abdomen: Soft and nontender. Normal bowel sounds. No distention or masses noted. Liver, spleen and kidneys non palpable. Musculoskeletal: Strength 5/5 BUE/BLE. No difficulty with gait.  Neurological: Alert and oriented. Cranial nerves II-XII grossly intact. Coordination normal.  Psychiatric: Mood and affect normal. Behavior is normal. Judgment and thought content normal.    BMET    Component Value Date/Time   NA 141 02/19/2019 1030   NA 138 09/01/2013 0017   K 3.8 02/19/2019 1030   K 3.0 (L) 09/01/2013 0017   CL 100 02/19/2019 1030   CL 100 09/01/2013 0017   CO2 34 (H) 02/19/2019 1030   CO2 33 (H) 09/01/2013 0017   GLUCOSE  66 (L) 02/19/2019 1030   GLUCOSE 208 (H) 09/01/2013 0017   BUN 17 02/19/2019 1030   BUN 18 09/01/2013 0017   CREATININE 0.63 02/19/2019 1030   CREATININE 0.59 01/16/2017 0945   CALCIUM 10.1 02/19/2019 1030   CALCIUM 9.3 09/01/2013 0017   GFRNONAA >60 09/01/2013 0017   GFRAA >60 09/01/2013 0017    Lipid Panel     Component Value Date/Time   CHOL 139 08/20/2019 0828   TRIG 106.0 08/20/2019 0828   HDL 47.30 08/20/2019 0828   CHOLHDL 3 08/20/2019 0828   VLDL 21.2 08/20/2019 0828   LDLCALC 71 08/20/2019 0828    CBC    Component Value Date/Time   WBC 9.2 02/19/2019 1030   RBC 4.36 02/19/2019 1030   HGB 13.0 02/19/2019 1030   HGB 13.8 09/01/2013 0017   HCT 38.0 02/19/2019 1030   HCT 40.9 09/01/2013 0017   PLT 205.0 02/19/2019 1030   PLT 216 09/01/2013 0017   MCV 87.2 02/19/2019 1030   MCV 85 09/01/2013 0017   MCH 28.6 04/01/2015 0738   MCHC 34.3 02/19/2019 1030   RDW 14.1 02/19/2019 1030   RDW 13.9 09/01/2013 0017   LYMPHSABS 2.3 04/01/2015 0738   MONOABS 0.3 04/01/2015 0738   EOSABS 0.1 04/01/2015 0738   BASOSABS 0.0 04/01/2015 0738    Hgb A1C Lab Results  Component Value Date   HGBA1C 5.2 08/20/2019          Assessment & Plan:   Preventative Health Maintenance:  Flu shot today Tetanus due, will get at next annual exam Covid UTD Pneumovax today Pap smear UTD Mammogram scheduled Bone density done 2020, will request copy Colon screening due but she would like to hold off until next year. Encouraged her to consume a balanced diet and exercise regimen Advised her to see an eye doctor and dentist annually Will check CBC, CMET, Lipid, A1C and Vit D today  RTC in 1  year, sooner if needed Webb Silversmith, NP This visit occurred during the SARS-CoV-2 public health emergency.  Safety protocols were in place, including screening questions prior to the visit, additional usage of staff PPE, and extensive cleaning of exam room while observing appropriate contact  time as indicated for disinfecting solutions.

## 2020-02-25 NOTE — Assessment & Plan Note (Signed)
Clobetasol refilled today

## 2020-02-25 NOTE — Assessment & Plan Note (Signed)
Continue Atenolol-Chlorthalidone Discussed stopping Lisinopril given her weight loss and improvement in diabetes, she will consider this and monitor blood pressures. CMET today

## 2020-02-25 NOTE — Assessment & Plan Note (Signed)
CMET and Lipid profile today Encouraged her to consume a low fat diet Continue Rosuvastatin, will adjust if needed based on labs

## 2020-02-25 NOTE — Assessment & Plan Note (Signed)
A1C today No urine microalbumin secondary to ACEI thearpy Encouraged low carb diet, congratulated her on weight loss Encouraged routine eye exams Encouraged routine foot exams Flu and pneumovax today Covid UTD

## 2020-02-25 NOTE — Addendum Note (Signed)
Addended by: Lurlean Nanny on: 02/25/2020 04:01 PM   Modules accepted: Orders

## 2020-02-25 NOTE — Patient Instructions (Signed)

## 2020-02-25 NOTE — Assessment & Plan Note (Signed)
She is having some stress but stable off Wellbutrin She will let me know if she wants to restart this Support offered

## 2020-03-23 ENCOUNTER — Other Ambulatory Visit: Payer: Self-pay | Admitting: Internal Medicine

## 2020-03-25 ENCOUNTER — Other Ambulatory Visit: Payer: Self-pay | Admitting: Internal Medicine

## 2020-03-25 ENCOUNTER — Encounter: Payer: Self-pay | Admitting: Internal Medicine

## 2020-03-25 MED ORDER — LISINOPRIL 5 MG PO TABS
5.0000 mg | ORAL_TABLET | Freq: Every day | ORAL | 0 refills | Status: DC
Start: 2020-03-25 — End: 2020-06-13

## 2020-03-29 ENCOUNTER — Telehealth (INDEPENDENT_AMBULATORY_CARE_PROVIDER_SITE_OTHER): Payer: 59 | Admitting: Internal Medicine

## 2020-03-29 ENCOUNTER — Encounter: Payer: Self-pay | Admitting: Internal Medicine

## 2020-03-29 DIAGNOSIS — J0100 Acute maxillary sinusitis, unspecified: Secondary | ICD-10-CM | POA: Diagnosis not present

## 2020-03-29 MED ORDER — AMOXICILLIN-POT CLAVULANATE 875-125 MG PO TABS: 1.0000 | ORAL_TABLET | Freq: Two times a day (BID) | ORAL | 0 refills | Status: DC

## 2020-03-29 NOTE — Patient Instructions (Signed)

## 2020-03-29 NOTE — Progress Notes (Signed)
Virtual Visit via Video Note  I connected with Hannah Giles on 03/29/20 at 12:15 PM EDT by a video enabled telemedicine application and verified that I am speaking with the correct person using two identifiers.  Location: Patient: Home Provider: Office  Person's participating in this video call: Webb Silversmith, NP and Hannah Giles  I discussed the limitations of evaluation and management by telemedicine and the availability of in person appointments. The patient expressed understanding and agreed to proceed.  HPI  Pt reports facial pain and pressure, nasal congestion, sore throat and cough. This started 10 days ago. She is blowing yellow/green mucous out of her nose. She denies difficulty swallowing. The cough is mainly non productive. She denies headache, visual changes, runny nose, ear pain, loss of taste/smell or SOB. She denies fever, chills or body aches. She has not had sick contacts or exposure to Covid that she is aware of. She has had 4 negative Covid tests at home. She has tried Mucinex and Robitussin with minimal relief of symptoms.  Review of Systems     Past Medical History:  Diagnosis Date  . Hyperlipidemia   . Hypertension   . Urinary incontinence     Family History  Problem Relation Age of Onset  . Cancer Mother        colon  . Colon cancer Mother   . Heart disease Father        s/p stents in the 60's  . Dementia Father   . Diabetes Paternal Uncle   . Hypertension Sister   . Pancreatic cancer Paternal Grandfather   . Rectal cancer Neg Hx   . Stomach cancer Neg Hx     Social History   Socioeconomic History  . Marital status: Married    Spouse name: Not on file  . Number of children: Not on file  . Years of education: Not on file  . Highest education level: Not on file  Occupational History  . Occupation: Therapist, sports, Theme park manager  Tobacco Use  . Smoking status: Former Smoker    Packs/day: 0.50    Years: 13.00    Pack years: 6.50    Types: Cigarettes     Quit date: 06/04/1989    Years since quitting: 30.8  . Smokeless tobacco: Never Used  Vaping Use  . Vaping Use: Never used  Substance and Sexual Activity  . Alcohol use: Yes    Alcohol/week: 3.0 - 4.0 standard drinks    Types: 3 - 4 Glasses of wine per week    Comment: socially  . Drug use: No  . Sexual activity: Not on file  Other Topics Concern  . Not on file  Social History Narrative  . Not on file   Social Determinants of Health   Financial Resource Strain:   . Difficulty of Paying Living Expenses: Not on file  Food Insecurity:   . Worried About Charity fundraiser in the Last Year: Not on file  . Ran Out of Food in the Last Year: Not on file  Transportation Needs:   . Lack of Transportation (Medical): Not on file  . Lack of Transportation (Non-Medical): Not on file  Physical Activity:   . Days of Exercise per Week: Not on file  . Minutes of Exercise per Session: Not on file  Stress:   . Feeling of Stress : Not on file  Social Connections:   . Frequency of Communication with Friends and Family: Not on file  . Frequency of Social Gatherings  with Friends and Family: Not on file  . Attends Religious Services: Not on file  . Active Member of Clubs or Organizations: Not on file  . Attends Archivist Meetings: Not on file  . Marital Status: Not on file  Intimate Partner Violence:   . Fear of Current or Ex-Partner: Not on file  . Emotionally Abused: Not on file  . Physically Abused: Not on file  . Sexually Abused: Not on file    Allergies  Allergen Reactions  . Niacin     REACTION: flushing, SOB face numb     Constitutional:  Denies headache, fatigue, fever or abrupt weight changes.  HEENT:  Positive facial pain, nasal congestion and sore throat. Denies eye redness, ear pain, ringing in the ears, wax buildup, runny nose or bloody nose. Respiratory: Positive cough. Denies difficulty breathing or shortness of breath.  Cardiovascular: Denies chest pain,  chest tightness, palpitations or swelling in the hands or feet.   No other specific complaints in a complete review of systems (except as listed in HPI above).  Objective:   General: Appears her stated age, in NAD. HEENT: Head: normal shape and size, maxillary sinus tenderness noted; Nose: congestion noted; Throat/Mouth: no hoarseness noted.  Pulmonary/Chest: Normal effort. No respiratory distress.       Assessment & Plan:   Acute Maxillary Sinusitis  Can use a Neti Pot which can be purchased from your local drug store. Flonase 2 sprays each nostril for 3 days and then as needed. eRx for Augmentin BID for 10 days  RTC as needed or if symptoms persist. Webb Silversmith, NP   Follow Up Instructions:    I discussed the assessment and treatment plan with the patient. The patient was provided an opportunity to ask questions and all were answered. The patient agreed with the plan and demonstrated an understanding of the instructions.   The patient was advised to call back or seek an in-person evaluation if the symptoms worsen or if the condition fails to improve as anticipated.     Webb Silversmith, NP

## 2020-04-02 ENCOUNTER — Ambulatory Visit: Payer: 59

## 2020-04-07 DIAGNOSIS — D225 Melanocytic nevi of trunk: Secondary | ICD-10-CM | POA: Diagnosis not present

## 2020-04-07 DIAGNOSIS — D2272 Melanocytic nevi of left lower limb, including hip: Secondary | ICD-10-CM | POA: Diagnosis not present

## 2020-04-07 DIAGNOSIS — L821 Other seborrheic keratosis: Secondary | ICD-10-CM | POA: Diagnosis not present

## 2020-04-07 DIAGNOSIS — L603 Nail dystrophy: Secondary | ICD-10-CM | POA: Diagnosis not present

## 2020-04-07 DIAGNOSIS — D2261 Melanocytic nevi of right upper limb, including shoulder: Secondary | ICD-10-CM | POA: Diagnosis not present

## 2020-04-07 DIAGNOSIS — D2262 Melanocytic nevi of left upper limb, including shoulder: Secondary | ICD-10-CM | POA: Diagnosis not present

## 2020-04-07 DIAGNOSIS — L900 Lichen sclerosus et atrophicus: Secondary | ICD-10-CM | POA: Diagnosis not present

## 2020-04-07 DIAGNOSIS — D2271 Melanocytic nevi of right lower limb, including hip: Secondary | ICD-10-CM | POA: Diagnosis not present

## 2020-04-11 ENCOUNTER — Other Ambulatory Visit: Payer: Self-pay | Admitting: Internal Medicine

## 2020-04-11 MED ORDER — ATENOLOL-CHLORTHALIDONE 50-25 MG PO TABS: 1.0000 | ORAL_TABLET | Freq: Every day | ORAL | 1 refills | Status: DC

## 2020-05-24 ENCOUNTER — Other Ambulatory Visit: Payer: Self-pay | Admitting: Internal Medicine

## 2020-05-24 ENCOUNTER — Encounter: Payer: Self-pay | Admitting: Radiology

## 2020-06-09 ENCOUNTER — Other Ambulatory Visit: Payer: 59

## 2020-06-13 ENCOUNTER — Other Ambulatory Visit: Payer: Self-pay | Admitting: Internal Medicine

## 2020-06-13 MED ORDER — LISINOPRIL 5 MG PO TABS
5.0000 mg | ORAL_TABLET | Freq: Every day | ORAL | 0 refills | Status: DC
Start: 2020-06-13 — End: 2020-06-13

## 2020-07-01 ENCOUNTER — Other Ambulatory Visit: Payer: Self-pay | Admitting: Internal Medicine

## 2020-07-01 ENCOUNTER — Encounter: Payer: Self-pay | Admitting: Internal Medicine

## 2020-07-01 ENCOUNTER — Telehealth: Payer: 59 | Admitting: Nurse Practitioner

## 2020-07-01 ENCOUNTER — Telehealth (INDEPENDENT_AMBULATORY_CARE_PROVIDER_SITE_OTHER): Payer: 59 | Admitting: Family Medicine

## 2020-07-01 ENCOUNTER — Encounter: Payer: Self-pay | Admitting: Family Medicine

## 2020-07-01 DIAGNOSIS — U071 COVID-19: Secondary | ICD-10-CM | POA: Diagnosis not present

## 2020-07-01 MED ORDER — NAPROXEN 500 MG PO TABS: 500.0000 mg | ORAL_TABLET | Freq: Two times a day (BID) | ORAL | 0 refills | Status: DC

## 2020-07-01 MED ORDER — ROSUVASTATIN CALCIUM 5 MG PO TABS: 5.0000 mg | ORAL_TABLET | Freq: Every day | ORAL | 3 refills | Status: DC

## 2020-07-01 MED ORDER — BENZONATATE 100 MG PO CAPS: 100.0000 mg | ORAL_CAPSULE | Freq: Three times a day (TID) | ORAL | 0 refills | Status: DC | PRN

## 2020-07-01 NOTE — Progress Notes (Signed)
Virtual Visit via Telephone Note  I connected with Hannah Giles on 07/01/20 at  2:40 PM EST by telephone and verified that I am speaking with the correct person using two identifiers.   I discussed the limitations, risks, security and privacy concerns of performing an evaluation and management service by telephone and the availability of in person appointments. I also discussed with the patient that there may be a patient responsible charge related to this service. The patient expressed understanding and agreed to proceed.  Location patient: home, [place] Location provider: work or home office Participants present for the call: patient, provider Patient did not have a visit with me in the prior 7 days to address this/these issue(s).   History of Present Illness:  Acute telemedicine visit for COVID19: -Onset: symptoms started and tested positive on the 26th after covid exposure -Symptoms include: body aches, some sinus congestion, mild headache, mild body aches -Denies: fevers, SOB, CP, vomiting, diarrhea, inability to eat/drink/get out of bed -Has tried:tessalon, naproxen -Pertinent past medical history: HTN, DM -Pertinent medication allergies: niacin -COVID-19 vaccine status: fully vaccinated and had booster in december -bp is up a bit today 150 SBP after taking musinex   Observations/Objective: Patient sounds cheerful and well on the phone. I do not appreciate any SOB. Speech and thought processing are grossly intact. Patient reported vitals:  Assessment and Plan:  COVID-19  -we discussed possible serious and likely etiologies, options for evaluation and workup, limitations of telemedicine visit vs in person visit, treatment, treatment risks and precautions. Pt prefers to treat via telemedicine empirically rather than in person at this moment.  She opted to use nasal saline, the tessalon she has at home and tylenol if needed. Advised to avoid nsaids and discussed other OTC meds that  could elevate her BP. She will monitor. Discussed treatment options, potential complications, isolation and precautions. She declined referral to treatment center.  Work/School slipped offered: declined Scheduled follow up with PCP offered:agrees to call back if needed.  Advised to seek prompt in person care if worsening, new symptoms arise, or if is not improving with treatment. Advised of options for inperson care in case PCP office not available.    Follow Up Instructions:  I did not refer this patient for an OV with me in the next 24 hours for this/these issue(s).  I discussed the assessment and treatment plan with the patient. The patient was provided an opportunity to ask questions and all were answered. The patient agreed with the plan and demonstrated an understanding of the instructions.   I spent 17 minutes on the date of this visit in the care of this patient. See summary of tasks completed to properly care for this patient in the detailed notes above which often include previsit review of recent office visit notes, review of PMH, medications, allergies, evaluation of the patient and ordering and instructing patient on testing and care options.     Lucretia Kern, DO

## 2020-07-01 NOTE — Progress Notes (Signed)

## 2020-07-01 NOTE — Patient Instructions (Addendum)
  HOME CARE TIPS:  -can use tylenol for fevers, aches and pains per instructions  Can use the tessalon for cough  -can use nasal saline a few times per day if you have nasal congestion  -stay hydrated, drink plenty of fluids and eat small healthy meals - avoid dairy  -can take 1000 IU (31mcg) Vit D3 and 100-500 mg of Vit C daily per instructions  -If the Covid test is positive, check out the CDC website for more information on home care, transmission and treatment for COVID19  -follow up with your doctor in 2-3 days unless improving and feeling better  -stay home while sick, except to seek medical care, and if you have COVID19 ideally it would be best to stay home for a full 10 days since the onset of symptoms PLUS one day of no fever and feeling better. Wear a good mask (such as N95 or KN95) if around others to reduce the risk of transmission.  It was nice to meet you today, and I really hope you are feeling better soon. I help [facility] out with telemedicine visits on Tuesdays and Thursdays and am available for visits on those days. If you have any concerns or questions following this visit please schedule a follow up visit with your Primary Care doctor or seek care at a local urgent care clinic to avoid delays in care.    Seek in person care or schedule a follow up video visit promptly if your symptoms worsen, new concerns arise or you are not improving with treatment. Call 911 and/or seek emergency care if your symptoms are severe or life threatening.

## 2020-07-14 DIAGNOSIS — Z1231 Encounter for screening mammogram for malignant neoplasm of breast: Secondary | ICD-10-CM | POA: Diagnosis not present

## 2020-07-14 LAB — HM MAMMOGRAPHY

## 2020-08-11 ENCOUNTER — Other Ambulatory Visit: Payer: Self-pay | Admitting: Radiology

## 2020-08-11 DIAGNOSIS — L988 Other specified disorders of the skin and subcutaneous tissue: Secondary | ICD-10-CM | POA: Diagnosis not present

## 2020-08-11 DIAGNOSIS — Z6828 Body mass index (BMI) 28.0-28.9, adult: Secondary | ICD-10-CM | POA: Diagnosis not present

## 2020-08-11 DIAGNOSIS — R5383 Other fatigue: Secondary | ICD-10-CM | POA: Diagnosis not present

## 2020-08-11 DIAGNOSIS — R32 Unspecified urinary incontinence: Secondary | ICD-10-CM | POA: Diagnosis not present

## 2020-08-11 DIAGNOSIS — Z01419 Encounter for gynecological examination (general) (routine) without abnormal findings: Secondary | ICD-10-CM | POA: Diagnosis not present

## 2020-08-11 DIAGNOSIS — L989 Disorder of the skin and subcutaneous tissue, unspecified: Secondary | ICD-10-CM | POA: Diagnosis not present

## 2020-08-11 DIAGNOSIS — L900 Lichen sclerosus et atrophicus: Secondary | ICD-10-CM | POA: Diagnosis not present

## 2020-08-19 ENCOUNTER — Encounter: Payer: Self-pay | Admitting: Internal Medicine

## 2020-08-25 ENCOUNTER — Encounter: Payer: Self-pay | Admitting: Internal Medicine

## 2020-08-25 ENCOUNTER — Other Ambulatory Visit: Payer: Self-pay

## 2020-08-25 ENCOUNTER — Ambulatory Visit: Payer: 59 | Admitting: Internal Medicine

## 2020-08-25 ENCOUNTER — Ambulatory Visit (INDEPENDENT_AMBULATORY_CARE_PROVIDER_SITE_OTHER): Payer: 59 | Admitting: Internal Medicine

## 2020-08-25 VITALS — BP 126/80 | HR 60 | Temp 97.8°F | Wt 191.0 lb

## 2020-08-25 DIAGNOSIS — I10 Essential (primary) hypertension: Secondary | ICD-10-CM | POA: Diagnosis not present

## 2020-08-25 DIAGNOSIS — F4323 Adjustment disorder with mixed anxiety and depressed mood: Secondary | ICD-10-CM | POA: Diagnosis not present

## 2020-08-25 DIAGNOSIS — Z23 Encounter for immunization: Secondary | ICD-10-CM

## 2020-08-25 DIAGNOSIS — E119 Type 2 diabetes mellitus without complications: Secondary | ICD-10-CM | POA: Diagnosis not present

## 2020-08-25 DIAGNOSIS — L900 Lichen sclerosus et atrophicus: Secondary | ICD-10-CM | POA: Diagnosis not present

## 2020-08-25 DIAGNOSIS — E7800 Pure hypercholesterolemia, unspecified: Secondary | ICD-10-CM | POA: Diagnosis not present

## 2020-08-25 LAB — LIPID PANEL
Cholesterol: 174 mg/dL (ref 0–200)
HDL: 61.7 mg/dL (ref >39.00)
LDL Cholesterol: 91 mg/dL (ref 0–99)
NonHDL: 112.28
Total CHOL/HDL Ratio: 3
Triglycerides: 108 mg/dL (ref 0.0–149.0)
VLDL: 21.6 mg/dL (ref 0.0–40.0)

## 2020-08-25 LAB — BASIC METABOLIC PANEL
BUN: 18 mg/dL (ref 6–23)
CO2: 34 mEq/L — ABNORMAL HIGH (ref 19–32)
Calcium: 10.1 mg/dL (ref 8.4–10.5)
Chloride: 100 mEq/L (ref 96–112)
Creatinine, Ser: 0.57 mg/dL (ref 0.40–1.20)
GFR: 98.04 mL/min (ref >60.00)
Glucose, Bld: 112 mg/dL — ABNORMAL HIGH (ref 70–99)
Potassium: 4.7 mEq/L (ref 3.5–5.1)
Sodium: 141 mEq/L (ref 135–145)

## 2020-08-25 LAB — HEMOGLOBIN A1C: Hgb A1c MFr Bld: 5.2 % (ref 4.6–6.5)

## 2020-08-25 NOTE — Patient Instructions (Signed)

## 2020-08-25 NOTE — Assessment & Plan Note (Signed)
Managed with steroid cream by GYN/dermatology

## 2020-08-25 NOTE — Progress Notes (Signed)
Subjective:    Patient ID: Hannah Giles, female    DOB: 04/26/1961, 60 y.o.   MRN: [id number]  HPI  Patient presents to clinic today for 43-month follow-up of chronic conditions.  HTN: Her BP today is 126/80.  She is taking Atenolol-Chlorthalidone and Lisinopril as prescribed.  There is no ECG on file.  HLD: Her last LDL was 83, 02/2020.  She denies myalgias on Rosuvastatin (5mg  daily).  She tries to consume a low-fat diet.  DM2: Her last A1c was 5.4%.  She is not taking any oral diabetic medication at this time.  Her fasting blood sugars are < 100.  She checks her feet routinely.  Her last eye exam was 01/2020.  Flu 02/2020.  Pneumovax 02/2020.  Covid Pfizer x 3.  Anxiety and Depression: Stable off meds.  She is not currently seeing a therapist.  She denies SI/HI.  Review of Systems      Past Medical History:  Diagnosis Date  . Hyperlipidemia   . Hypertension   . Urinary incontinence     Current Outpatient Medications  Medication Sig Dispense Refill  . atenolol-chlorthalidone (TENORETIC) 50-25 MG tablet Take 1 tablet by mouth daily. 90 tablet 1  . clobetasol ointment (TEMOVATE) 3.47 % Apply 1 application topically 2 (two) times daily. 60 g 2  . glucose blood (FREESTYLE LITE) test strip 1 each by Other route 2 (two) times daily. 100 each 2  . Lancets (FREESTYLE) lancets 1 each by Other route 2 (two) times daily as needed for other. 100 each 2  . lisinopril (ZESTRIL) 5 MG tablet Take 1 tablet (5 mg total) by mouth daily. 90 tablet 0  . polyethylene glycol (MIRALAX / GLYCOLAX) packet Take 17 g by mouth daily.    . rosuvastatin (CRESTOR) 5 MG tablet Take 1 tablet (5 mg total) by mouth daily. 90 tablet 3   No current facility-administered medications for this visit.    Allergies  Allergen Reactions  . Niacin     REACTION: flushing, SOB face numb    Family History  Problem Relation Age of Onset  . Cancer Mother        colon  . Colon cancer Mother   . Heart disease Father         s/p stents in the 60's  . Dementia Father   . Diabetes Paternal Uncle   . Hypertension Sister   . Pancreatic cancer Paternal Grandfather   . Rectal cancer Neg Hx   . Stomach cancer Neg Hx     Social History   Socioeconomic History  . Marital status: Married    Spouse name: Not on file  . Number of children: Not on file  . Years of education: Not on file  . Highest education level: Not on file  Occupational History  . Occupation: Therapist, sports, Theme park manager  Tobacco Use  . Smoking status: Former Smoker    Packs/day: 0.50    Years: 13.00    Pack years: 6.50    Types: Cigarettes    Quit date: 06/04/1989    Years since quitting: 31.2  . Smokeless tobacco: Never Used  Vaping Use  . Vaping Use: Never used  Substance and Sexual Activity  . Alcohol use: Yes    Alcohol/week: 3.0 - 4.0 standard drinks    Types: 3 - 4 Glasses of wine per week    Comment: socially  . Drug use: No  . Sexual activity: Not on file  Other Topics Concern  . Not  on file  Social History Narrative  . Not on file   Social Determinants of Health   Financial Resource Strain: Not on file  Food Insecurity: Not on file  Transportation Needs: Not on file  Physical Activity: Not on file  Stress: Not on file  Social Connections: Not on file  Intimate Partner Violence: Not on file     Constitutional: Denies fever, malaise, fatigue, headache or abrupt weight changes.  HEENT: Denies eye pain, eye redness, ear pain, ringing in the ears, wax buildup, runny nose, nasal congestion, bloody nose, or sore throat. Respiratory: Denies difficulty breathing, shortness of breath, cough or sputum production.   Cardiovascular: Denies chest pain, chest tightness, palpitations or swelling in the hands or feet.  Gastrointestinal: Denies abdominal pain, bloating, constipation, diarrhea or blood in the stool.  GU: Denies urgency, frequency, pain with urination, burning sensation, blood in urine, odor or  discharge. Musculoskeletal: Denies decrease in range of motion, difficulty with gait, muscle pain or joint pain and swelling.  Skin: Pt reports rash of bilateral upper legs. Denies rashes, lesions or ulcercations.  Neurological: Denies dizziness, difficulty with memory, difficulty with speech or problems with balance and coordination.  Psych: Denies anxiety, depression, SI/HI.  No other specific complaints in a complete review of systems (except as listed in HPI above).  Objective:   Physical Exam  BP 126/80   Pulse 60   Temp 97.8 F (36.6 C) (Temporal)   Wt 191 lb (86.6 kg)   SpO2 98%   BMI 27.94 kg/m   Wt Readings from Last 3 Encounters:  02/25/20 186 lb (84.4 kg)  02/19/19 202 lb (91.6 kg)  09/25/18 223 lb 8 oz (101.4 kg)    General: Appears her stated age, well developed, well nourished in NAD. Skin: Warm, dry and intact. Scattered oval hypopigmented lesions noted of bilateral upper thighs. No ulcerations noted. HEENT: Head: normal shape and size; Eyes: sclera white, no icterus, conjunctiva pink, PERRLA and EOMs intact;  Cardiovascular: Normal rate and rhythm. S1,S2 noted.  No murmur, rubs or gallops noted. No carotid bruits noted. Pulmonary/Chest: Normal effort and positive vesicular breath sounds. No respiratory distress. No wheezes, rales or ronchi noted.  Neurological: Alert and oriented.  Psychiatric: Mood and affect normal. Behavior is normal. Judgment and thought content normal.     BMET    Component Value Date/Time   NA 139 02/25/2020 1013   NA 138 09/01/2013 0017   K 4.6 02/25/2020 1013   K 3.0 (L) 09/01/2013 0017   CL 99 02/25/2020 1013   CL 100 09/01/2013 0017   CO2 34 (H) 02/25/2020 1013   CO2 33 (H) 09/01/2013 0017   GLUCOSE 97 02/25/2020 1013   GLUCOSE 208 (H) 09/01/2013 0017   BUN 17 02/25/2020 1013   BUN 18 09/01/2013 0017   CREATININE 0.54 02/25/2020 1013   CREATININE 0.59 01/16/2017 0945   CALCIUM 9.8 02/25/2020 1013   CALCIUM 9.3 09/01/2013  0017   GFRNONAA >60 09/01/2013 0017   GFRAA >60 09/01/2013 0017    Lipid Panel     Component Value Date/Time   CHOL 152 02/25/2020 1013   TRIG 80.0 02/25/2020 1013   HDL 52.80 02/25/2020 1013   CHOLHDL 3 02/25/2020 1013   VLDL 16.0 02/25/2020 1013   LDLCALC 83 02/25/2020 1013    CBC    Component Value Date/Time   WBC 9.6 02/25/2020 1013   RBC 4.72 02/25/2020 1013   HGB 14.6 02/25/2020 1013   HGB 13.8 09/01/2013 0017  HCT 42.5 02/25/2020 1013   HCT 40.9 09/01/2013 0017   PLT 171.0 02/25/2020 1013   PLT 216 09/01/2013 0017   MCV 90.1 02/25/2020 1013   MCV 85 09/01/2013 0017   MCH 28.6 04/01/2015 0738   MCHC 34.3 02/25/2020 1013   RDW 12.8 02/25/2020 1013   RDW 13.9 09/01/2013 0017   LYMPHSABS 2.3 04/01/2015 0738   MONOABS 0.3 04/01/2015 0738   EOSABS 0.1 04/01/2015 0738   BASOSABS 0.0 04/01/2015 0738    Hgb A1C Lab Results  Component Value Date   HGBA1C 5.4 02/25/2020           Assessment & Plan:    Webb Silversmith, NP This visit occurred during the SARS-CoV-2 public health emergency.  Safety protocols were in place, including screening questions prior to the visit, additional usage of staff PPE, and extensive cleaning of exam room while observing appropriate contact time as indicated for disinfecting solutions.

## 2020-08-25 NOTE — Addendum Note (Signed)
Addended by: Lurlean Nanny on: 08/25/2020 03:43 PM   Modules accepted: Orders

## 2020-08-25 NOTE — Assessment & Plan Note (Signed)
BMET, A1C and Lipid profile today No urine microalbumin secondary to ACEI therapy Encouraged her to consume a low carb diet No medications- diet controlled Encouraged yearly eye exams Encouraged routine foot exams Flu, pneumovax and Covid UTD

## 2020-08-25 NOTE — Assessment & Plan Note (Signed)
Controlled on Lisinopril, Atenolol-Chlorthalidone Reinforced DASH diet BMET today

## 2020-08-25 NOTE — Assessment & Plan Note (Signed)
Doing well off meds.  Will monitor

## 2020-08-25 NOTE — Assessment & Plan Note (Signed)
Lipid profile today Encouraged her to consume a low fat diet Consider Rosuvastatin for now

## 2020-08-26 ENCOUNTER — Encounter: Payer: Self-pay | Admitting: Internal Medicine

## 2020-09-01 DIAGNOSIS — N3946 Mixed incontinence: Secondary | ICD-10-CM | POA: Diagnosis not present

## 2020-09-01 DIAGNOSIS — M6289 Other specified disorders of muscle: Secondary | ICD-10-CM | POA: Diagnosis not present

## 2020-09-01 DIAGNOSIS — M6281 Muscle weakness (generalized): Secondary | ICD-10-CM | POA: Diagnosis not present

## 2020-09-01 DIAGNOSIS — M62838 Other muscle spasm: Secondary | ICD-10-CM | POA: Diagnosis not present

## 2020-09-08 DIAGNOSIS — Z13820 Encounter for screening for osteoporosis: Secondary | ICD-10-CM | POA: Diagnosis not present

## 2020-09-08 DIAGNOSIS — Z808 Family history of malignant neoplasm of other organs or systems: Secondary | ICD-10-CM | POA: Diagnosis not present

## 2020-09-08 DIAGNOSIS — Z800 Family history of malignant neoplasm of digestive organs: Secondary | ICD-10-CM | POA: Diagnosis not present

## 2020-09-08 DIAGNOSIS — Z86010 Personal history of colonic polyps: Secondary | ICD-10-CM | POA: Diagnosis not present

## 2020-09-08 LAB — HM DEXA SCAN

## 2020-09-21 ENCOUNTER — Encounter: Payer: Self-pay | Admitting: Internal Medicine

## 2020-09-21 ENCOUNTER — Other Ambulatory Visit: Payer: Self-pay

## 2020-09-21 ENCOUNTER — Other Ambulatory Visit: Payer: Self-pay | Admitting: Internal Medicine

## 2020-09-22 ENCOUNTER — Other Ambulatory Visit: Payer: Self-pay

## 2020-09-22 MED ORDER — LISINOPRIL 5 MG PO TABS
ORAL_TABLET | Freq: Every day | ORAL | 0 refills | Status: DC
  Filled 2020-09-22: qty 90, 90d supply, fill #0

## 2020-09-22 MED ORDER — ATENOLOL-CHLORTHALIDONE 50-25 MG PO TABS
1.0000 | ORAL_TABLET | Freq: Every day | ORAL | 1 refills | Status: DC
Start: 2020-09-22 — End: 2021-03-16
  Filled 2020-09-22: qty 90, 90d supply, fill #0
  Filled 2020-12-20: qty 90, 90d supply, fill #1

## 2020-09-27 ENCOUNTER — Other Ambulatory Visit: Payer: Self-pay

## 2020-09-27 MED FILL — Rosuvastatin Calcium Tab 5 MG: ORAL | 90 days supply | Qty: 90 | Fill #0 | Status: AC

## 2020-09-28 ENCOUNTER — Other Ambulatory Visit: Payer: Self-pay

## 2020-09-29 DIAGNOSIS — M62838 Other muscle spasm: Secondary | ICD-10-CM | POA: Diagnosis not present

## 2020-09-29 DIAGNOSIS — N3946 Mixed incontinence: Secondary | ICD-10-CM | POA: Diagnosis not present

## 2020-09-29 DIAGNOSIS — M6281 Muscle weakness (generalized): Secondary | ICD-10-CM | POA: Diagnosis not present

## 2020-09-29 DIAGNOSIS — M6289 Other specified disorders of muscle: Secondary | ICD-10-CM | POA: Diagnosis not present

## 2020-10-27 DIAGNOSIS — N952 Postmenopausal atrophic vaginitis: Secondary | ICD-10-CM | POA: Diagnosis not present

## 2020-10-27 DIAGNOSIS — M62838 Other muscle spasm: Secondary | ICD-10-CM | POA: Diagnosis not present

## 2020-10-27 DIAGNOSIS — M6281 Muscle weakness (generalized): Secondary | ICD-10-CM | POA: Diagnosis not present

## 2020-10-27 DIAGNOSIS — M6289 Other specified disorders of muscle: Secondary | ICD-10-CM | POA: Diagnosis not present

## 2020-10-27 DIAGNOSIS — N3946 Mixed incontinence: Secondary | ICD-10-CM | POA: Diagnosis not present

## 2020-10-27 DIAGNOSIS — Z7183 Encounter for nonprocreative genetic counseling: Secondary | ICD-10-CM | POA: Diagnosis not present

## 2020-10-27 DIAGNOSIS — R32 Unspecified urinary incontinence: Secondary | ICD-10-CM | POA: Diagnosis not present

## 2020-11-10 ENCOUNTER — Ambulatory Visit (INDEPENDENT_AMBULATORY_CARE_PROVIDER_SITE_OTHER): Payer: 59

## 2020-11-10 ENCOUNTER — Other Ambulatory Visit: Payer: Self-pay

## 2020-11-10 DIAGNOSIS — Z23 Encounter for immunization: Secondary | ICD-10-CM

## 2020-11-10 NOTE — Progress Notes (Signed)
Per orders of Allie Bossier, 2nd injection of shingrix given by Loreen Freud. Patient tolerated injection well.

## 2020-11-17 DIAGNOSIS — M6281 Muscle weakness (generalized): Secondary | ICD-10-CM | POA: Diagnosis not present

## 2020-11-17 DIAGNOSIS — N3946 Mixed incontinence: Secondary | ICD-10-CM | POA: Diagnosis not present

## 2020-11-17 DIAGNOSIS — M6289 Other specified disorders of muscle: Secondary | ICD-10-CM | POA: Diagnosis not present

## 2020-11-17 DIAGNOSIS — M62838 Other muscle spasm: Secondary | ICD-10-CM | POA: Diagnosis not present

## 2020-12-12 ENCOUNTER — Other Ambulatory Visit (HOSPITAL_COMMUNITY): Payer: Self-pay

## 2020-12-13 ENCOUNTER — Other Ambulatory Visit (HOSPITAL_COMMUNITY): Payer: Self-pay

## 2020-12-20 ENCOUNTER — Other Ambulatory Visit: Payer: Self-pay

## 2020-12-20 ENCOUNTER — Telehealth: Payer: Self-pay | Admitting: Internal Medicine

## 2020-12-20 ENCOUNTER — Other Ambulatory Visit: Payer: Self-pay | Admitting: Internal Medicine

## 2020-12-20 MED FILL — Rosuvastatin Calcium Tab 5 MG: ORAL | 90 days supply | Qty: 90 | Fill #1 | Status: AC

## 2020-12-21 ENCOUNTER — Other Ambulatory Visit: Payer: Self-pay

## 2020-12-22 ENCOUNTER — Other Ambulatory Visit: Payer: Self-pay

## 2020-12-22 MED ORDER — LISINOPRIL 5 MG PO TABS
ORAL_TABLET | Freq: Every day | ORAL | 0 refills | Status: DC
  Filled 2020-12-22: qty 90, 90d supply, fill #0

## 2020-12-22 NOTE — Telephone Encounter (Signed)
Hannah Giles with [facility] pharmacy called in to inquire of Webb Silversmith why patient Rx for Lisinopril was denied per their system but states responded by other means. Please advise

## 2020-12-22 NOTE — Telephone Encounter (Signed)
I have no idea why this was denied. I have sent this in.

## 2020-12-22 NOTE — Addendum Note (Signed)
Addended by: Jearld Fenton on: 12/22/2020 11:02 AM   Modules accepted: Orders

## 2021-01-19 DIAGNOSIS — M4004 Postural kyphosis, thoracic region: Secondary | ICD-10-CM | POA: Diagnosis not present

## 2021-01-19 DIAGNOSIS — M62838 Other muscle spasm: Secondary | ICD-10-CM | POA: Diagnosis not present

## 2021-01-19 DIAGNOSIS — M6281 Muscle weakness (generalized): Secondary | ICD-10-CM | POA: Diagnosis not present

## 2021-01-19 DIAGNOSIS — M6289 Other specified disorders of muscle: Secondary | ICD-10-CM | POA: Diagnosis not present

## 2021-01-19 DIAGNOSIS — N3946 Mixed incontinence: Secondary | ICD-10-CM | POA: Diagnosis not present

## 2021-01-26 DIAGNOSIS — M4004 Postural kyphosis, thoracic region: Secondary | ICD-10-CM | POA: Diagnosis not present

## 2021-01-26 DIAGNOSIS — N3946 Mixed incontinence: Secondary | ICD-10-CM | POA: Diagnosis not present

## 2021-01-26 DIAGNOSIS — M62838 Other muscle spasm: Secondary | ICD-10-CM | POA: Diagnosis not present

## 2021-01-26 DIAGNOSIS — M6289 Other specified disorders of muscle: Secondary | ICD-10-CM | POA: Diagnosis not present

## 2021-01-26 DIAGNOSIS — M6281 Muscle weakness (generalized): Secondary | ICD-10-CM | POA: Diagnosis not present

## 2021-01-31 ENCOUNTER — Other Ambulatory Visit: Payer: Self-pay

## 2021-02-02 ENCOUNTER — Other Ambulatory Visit: Payer: Self-pay

## 2021-02-09 DIAGNOSIS — N3946 Mixed incontinence: Secondary | ICD-10-CM | POA: Diagnosis not present

## 2021-02-09 DIAGNOSIS — M4004 Postural kyphosis, thoracic region: Secondary | ICD-10-CM | POA: Diagnosis not present

## 2021-02-09 DIAGNOSIS — M6289 Other specified disorders of muscle: Secondary | ICD-10-CM | POA: Diagnosis not present

## 2021-02-09 DIAGNOSIS — M62838 Other muscle spasm: Secondary | ICD-10-CM | POA: Diagnosis not present

## 2021-02-09 DIAGNOSIS — M6281 Muscle weakness (generalized): Secondary | ICD-10-CM | POA: Diagnosis not present

## 2021-02-24 ENCOUNTER — Other Ambulatory Visit (HOSPITAL_COMMUNITY): Payer: Self-pay

## 2021-03-14 ENCOUNTER — Encounter: Payer: Self-pay | Admitting: Internal Medicine

## 2021-03-16 ENCOUNTER — Other Ambulatory Visit: Payer: Self-pay

## 2021-03-16 ENCOUNTER — Encounter: Payer: Self-pay | Admitting: Internal Medicine

## 2021-03-16 ENCOUNTER — Ambulatory Visit (INDEPENDENT_AMBULATORY_CARE_PROVIDER_SITE_OTHER): Payer: 59 | Admitting: Internal Medicine

## 2021-03-16 VITALS — BP 128/50 | HR 59 | Temp 97.7°F | Resp 18 | Ht 69.0 in | Wt 194.2 lb

## 2021-03-16 DIAGNOSIS — F4323 Adjustment disorder with mixed anxiety and depressed mood: Secondary | ICD-10-CM

## 2021-03-16 DIAGNOSIS — Z6828 Body mass index (BMI) 28.0-28.9, adult: Secondary | ICD-10-CM | POA: Diagnosis not present

## 2021-03-16 DIAGNOSIS — E7800 Pure hypercholesterolemia, unspecified: Secondary | ICD-10-CM | POA: Diagnosis not present

## 2021-03-16 DIAGNOSIS — L900 Lichen sclerosus et atrophicus: Secondary | ICD-10-CM

## 2021-03-16 DIAGNOSIS — Z0001 Encounter for general adult medical examination with abnormal findings: Secondary | ICD-10-CM | POA: Diagnosis not present

## 2021-03-16 DIAGNOSIS — E663 Overweight: Secondary | ICD-10-CM | POA: Insufficient documentation

## 2021-03-16 DIAGNOSIS — E6609 Other obesity due to excess calories: Secondary | ICD-10-CM | POA: Insufficient documentation

## 2021-03-16 DIAGNOSIS — Z23 Encounter for immunization: Secondary | ICD-10-CM | POA: Diagnosis not present

## 2021-03-16 DIAGNOSIS — E119 Type 2 diabetes mellitus without complications: Secondary | ICD-10-CM | POA: Diagnosis not present

## 2021-03-16 DIAGNOSIS — I10 Essential (primary) hypertension: Secondary | ICD-10-CM | POA: Diagnosis not present

## 2021-03-16 LAB — HM DIABETES EYE EXAM

## 2021-03-16 MED ORDER — LISINOPRIL 5 MG PO TABS
ORAL_TABLET | Freq: Every day | ORAL | 1 refills | Status: DC
  Filled 2021-03-16: qty 90, 90d supply, fill #0
  Filled 2021-06-19: qty 90, 90d supply, fill #1

## 2021-03-16 MED ORDER — ATENOLOL-CHLORTHALIDONE 50-25 MG PO TABS
1.0000 | ORAL_TABLET | Freq: Every day | ORAL | 1 refills | Status: DC
  Filled 2021-03-16: qty 90, 90d supply, fill #0
  Filled 2021-06-19: qty 90, 90d supply, fill #1

## 2021-03-16 MED ORDER — ROSUVASTATIN CALCIUM 5 MG PO TABS
ORAL_TABLET | Freq: Every day | ORAL | 1 refills | Status: DC
  Filled 2021-03-16: qty 90, 90d supply, fill #0
  Filled 2021-06-19: qty 90, 90d supply, fill #1

## 2021-03-16 NOTE — Assessment & Plan Note (Signed)
Continue steroid creams as prescribed

## 2021-03-16 NOTE — Assessment & Plan Note (Signed)
Controlled on Lisinopril and Atenolol-Chlorthalidone Reinforced DASH diet and exercise for weight loss CMET todayh

## 2021-03-16 NOTE — Assessment & Plan Note (Signed)
Encouraged diet and exercise for weight loss ?

## 2021-03-16 NOTE — Progress Notes (Signed)
Subjective:    Patient ID: Hannah Giles, female    DOB: 10/12/1960, 60 y.o.   MRN: [id number]  HPI  Pt presents to the clinic today for her annual exam. She is also due to follow up chronic conditions.  HTN: Her BP today is 128/50. She is taking Lisinopril and Atenolol-Chlorthalidone as prescribed. There is no ECG on file.  HLD: Her last LD was 91, triglycerides 108, 08/2020. She denies myalgias on Rosuvastatin. She tries to consume a low fat diet.  DM 2: Her last A1C was 5.2%, 08/2020. She is not taking any oral diabetic medication at this time. Her sugars range. She checks her feet routinely.  Anxiety and Depression: Currently not an issue off meds.  She is not seeing a therapist.  She denies SI/HI.  Osteopenia: Bone density from 09/2020 reviewed.  She has started taking Calcium and Vitamin D OTC.  She is walking most days.  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  She is also having the [facility] procedure done.  She is not taking any medications for this.  Lichen sclerosus: She reports this seems to be spreading.  She is using Clobetasol and Habetasol cream as prescribed.  Flu: 02/2021 Tetanus: 10/2009 COVID: Pfizer x2 Pneumovax: 02/2020 Shingrix: 08/2020, 11/2020 Pap smear: 06/2017 Mammogram: 09/2020 Bone density: 09/2020 Colon screening: 04/2014, every 5 years (scheduled 05/04/21) Vision screening: annually Dentist: biannually  Diet: She does eat meat. She consumes fruits and veggies. She tries to avoid fried foods. She drinks mostly water, coffee. Exercise: Walking  Review of Systems     Past Medical History:  Diagnosis Date   Hyperlipidemia    Hypertension    Urinary incontinence     Current Outpatient Medications  Medication Sig Dispense Refill   atenolol-chlorthalidone (TENORETIC) 50-25 MG tablet TAKE 1 TABLET BY MOUTH DAILY. 90 tablet 1   clobetasol ointment (TEMOVATE) 8.84 % Apply 1 application topically 2 (two) times daily. 60 g 1    glucose blood (FREESTYLE LITE) test strip 1 each by Other route 2 (two) times daily. 100 each 2   halobetasol (ULTRAVATE) 0.05 % ointment APPLY A THIN LAYER TO THE AFFECTED AREA(S) ONCE DAILY. DO NOT EXCEED 50 GRAMS (1 TUBE) PER WEEK OR 2 WEEKS DURATION 50 g 0   Lancets (FREESTYLE) lancets 1 each by Other route 2 (two) times daily as needed for other. 100 each 2   lisinopril (ZESTRIL) 5 MG tablet TAKE 1 TABLET (5 MG TOTAL) BY MOUTH DAILY. 90 tablet 0   polyethylene glycol (MIRALAX / GLYCOLAX) packet Take 17 g by mouth daily.     rosuvastatin (CRESTOR) 5 MG tablet TAKE 1 TABLET (5 MG TOTAL) BY MOUTH DAILY. 90 tablet 3   No current facility-administered medications for this visit.    Allergies  Allergen Reactions   Niacin     REACTION: flushing, SOB face numb    Family History  Problem Relation Age of Onset   Cancer Mother        colon   Colon cancer Mother    Heart disease Father        s/p stents in the 19's   Dementia Father    Diabetes Paternal Uncle    Hypertension Sister    Pancreatic cancer Paternal Grandfather    Rectal cancer Neg Hx    Stomach cancer Neg Hx     Social History   Socioeconomic History   Marital status: Married    Spouse name:  Not on file   Number of children: Not on file   Years of education: Not on file   Highest education level: Not on file  Occupational History   Occupation: Therapist, sports, Theme park manager  Tobacco Use   Smoking status: Former    Packs/day: 0.50    Years: 13.00    Pack years: 6.50    Types: Cigarettes    Quit date: 06/04/1989    Years since quitting: 31.8   Smokeless tobacco: Never  Vaping Use   Vaping Use: Never used  Substance and Sexual Activity   Alcohol use: Yes    Alcohol/week: 3.0 - 4.0 standard drinks    Types: 3 - 4 Glasses of wine per week    Comment: socially   Drug use: No   Sexual activity: Not on file  Other Topics Concern   Not on file  Social History Narrative   Not on file   Social Determinants of Health    Financial Resource Strain: Not on file  Food Insecurity: Not on file  Transportation Needs: Not on file  Physical Activity: Not on file  Stress: Not on file  Social Connections: Not on file  Intimate Partner Violence: Not on file     Constitutional: Denies fever, malaise, fatigue, headache or abrupt weight changes.  HEENT: Denies eye pain, eye redness, ear pain, ringing in the ears, wax buildup, runny nose, nasal congestion, bloody nose, or sore throat. Respiratory: Denies difficulty breathing, shortness of breath, cough or sputum production.   Cardiovascular: Denies chest pain, chest tightness, palpitations or swelling in the hands or feet.  Gastrointestinal: Denies abdominal pain, bloating, constipation, diarrhea or blood in the stool.  GU: Patient reports mixed incontinence.  Denies urgency, frequency, pain with urination, burning sensation, blood in urine, odor or discharge. Musculoskeletal: Denies decrease in range of motion, difficulty with gait, muscle pain or joint pain and swelling.  Skin: Patient reports lichen sclerosis of her upper extremities, vaginal area and thighs denies redness, rashes, lesions or ulcercations.  Neurological: Denies dizziness, difficulty with memory, difficulty with speech or problems with balance and coordination.  Psych: Patient has a history of anxiety and depression.  Denies SI/HI.  No other specific complaints in a complete review of systems (except as listed in HPI above).  Objective:   Physical Exam  BP (!) 128/50 (BP Location: Right Arm, Patient Position: Sitting, Cuff Size: Large)   Pulse (!) 59   Temp 97.7 F (36.5 C) (Temporal)   Resp 18   Ht 5' 9"  (1.753 m)   Wt 194 lb 3.2 oz (88.1 kg)   SpO2 100%   BMI 28.68 kg/m   Wt Readings from Last 3 Encounters:  08/25/20 191 lb (86.6 kg)  02/25/20 186 lb (84.4 kg)  02/19/19 202 lb (91.6 kg)    General: Appears her stated age, overweight, in NAD. Skin: Warm, dry and intact.  Lichen  sclerosis noted of the bilateral upper arms. HEENT: Head: normal shape and size; Eyes: EOMs intact;  Neck:  Neck supple, trachea midline. No masses, lumps or thyromegaly present.  Cardiovascular: Bradycardic with normal rhythm. S1,S2 noted.  No murmur, rubs or gallops noted. No JVD or BLE edema. No carotid bruits noted. Pulmonary/Chest: Normal effort and positive vesicular breath sounds. No respiratory distress. No wheezes, rales or ronchi noted.  Abdomen: Soft and nontender. Normal bowel sounds. No distention or masses noted. Liver, spleen and kidneys non palpable. Musculoskeletal: Strength 5/5 BUE/BLE.  No difficulty with gait.  Neurological: Alert and  oriented. Cranial nerves II-XII grossly intact. Coordination normal.  Psychiatric: Mood and affect normal. Behavior is normal. Judgment and thought content normal.    BMET    Component Value Date/Time   NA 141 08/25/2020 0928   NA 138 09/01/2013 0017   K 4.7 08/25/2020 0928   K 3.0 (L) 09/01/2013 0017   CL 100 08/25/2020 0928   CL 100 09/01/2013 0017   CO2 34 (H) 08/25/2020 0928   CO2 33 (H) 09/01/2013 0017   GLUCOSE 112 (H) 08/25/2020 0928   GLUCOSE 208 (H) 09/01/2013 0017   BUN 18 08/25/2020 0928   BUN 18 09/01/2013 0017   CREATININE 0.57 08/25/2020 0928   CREATININE 0.59 01/16/2017 0945   CALCIUM 10.1 08/25/2020 0928   CALCIUM 9.3 09/01/2013 0017   GFRNONAA >60 09/01/2013 0017   GFRAA >60 09/01/2013 0017    Lipid Panel     Component Value Date/Time   CHOL 174 08/25/2020 0928   TRIG 108.0 08/25/2020 0928   HDL 61.70 08/25/2020 0928   CHOLHDL 3 08/25/2020 0928   VLDL 21.6 08/25/2020 0928   LDLCALC 91 08/25/2020 0928    CBC    Component Value Date/Time   WBC 9.6 02/25/2020 1013   RBC 4.72 02/25/2020 1013   HGB 14.6 02/25/2020 1013   HGB 13.8 09/01/2013 0017   HCT 42.5 02/25/2020 1013   HCT 40.9 09/01/2013 0017   PLT 171.0 02/25/2020 1013   PLT 216 09/01/2013 0017   MCV 90.1 02/25/2020 1013   MCV 85 09/01/2013  0017   MCH 28.6 04/01/2015 0738   MCHC 34.3 02/25/2020 1013   RDW 12.8 02/25/2020 1013   RDW 13.9 09/01/2013 0017   LYMPHSABS 2.3 04/01/2015 0738   MONOABS 0.3 04/01/2015 0738   EOSABS 0.1 04/01/2015 0738   BASOSABS 0.0 04/01/2015 0738    Hgb A1C Lab Results  Component Value Date   HGBA1C 5.2 08/25/2020           Assessment & Plan:   Preventative Health Maintenance:  Flu shot UTD Tdap today Encouraged her to get her COVID booster Pneumovax UTD Shingrix UTD Pap smear UTD Mammogram UTD, will [facility] from [facility] Bone density UTD Colon screening scheduled Encouraged her to consume a balanced diet and exercise regimen Advised her to see an eye doctor and dentist annually We will check CBC, c-Met, lipid, A1c today  RTC in 6 months, follow-up chronic conditions Webb Silversmith, NP This visit occurred during the SARS-CoV-2 public health emergency.  Safety protocols were in place, including screening questions prior to the visit, additional usage of staff PPE, and extensive cleaning of exam room while observing appropriate contact time as indicated for disinfecting solutions.

## 2021-03-16 NOTE — Assessment & Plan Note (Signed)
Stable off meds Support offered 

## 2021-03-16 NOTE — Patient Instructions (Signed)

## 2021-03-16 NOTE — Assessment & Plan Note (Signed)
CMET and lipid profile today Encouraged her to consume a low fat diet Continue Rosuvastatin 

## 2021-03-16 NOTE — Assessment & Plan Note (Signed)
A1C today No urine microalbumin secondary to ACEI therapy Encouraged her to consume a low carb diet Encouraged routine eye exams Encouraged routine foot exams Flu and pneumovax UTD Encouraged her to get her Covid booster

## 2021-03-17 ENCOUNTER — Encounter: Payer: Self-pay | Admitting: Internal Medicine

## 2021-03-17 LAB — COMPLETE METABOLIC PANEL WITH GFR
AG Ratio: 1.7 (calc) (ref 1.0–2.5)
ALT: 15 U/L (ref 6–29)
AST: 15 U/L (ref 10–35)
Albumin: 4.3 g/dL (ref 3.6–5.1)
Alkaline phosphatase (APISO): 72 U/L (ref 37–153)
BUN: 14 mg/dL (ref 7–25)
CO2: 32 mmol/L (ref 20–32)
Calcium: 9.8 mg/dL (ref 8.6–10.4)
Chloride: 100 mmol/L (ref 98–110)
Creat: 0.52 mg/dL (ref 0.50–1.05)
Globulin: 2.5 g/dL (calc) (ref 1.9–3.7)
Glucose, Bld: 95 mg/dL (ref 65–99)
Potassium: 4 mmol/L (ref 3.5–5.3)
Sodium: 142 mmol/L (ref 135–146)
Total Bilirubin: 0.4 mg/dL (ref 0.2–1.2)
Total Protein: 6.8 g/dL (ref 6.1–8.1)
eGFR: 105 mL/min/{1.73_m2} (ref >=60)

## 2021-03-17 LAB — LIPID PANEL
Cholesterol: 202 mg/dL — ABNORMAL HIGH (ref <200)
HDL: 67 mg/dL (ref >=50)
LDL Cholesterol (Calc): 115 mg/dL (calc) — ABNORMAL HIGH
Non-HDL Cholesterol (Calc): 135 mg/dL (calc) — ABNORMAL HIGH (ref <130)
Total CHOL/HDL Ratio: 3 (calc) (ref <5.0)
Triglycerides: 94 mg/dL (ref <150)

## 2021-03-17 LAB — CBC
HCT: 42.5 % (ref 35.0–45.0)
Hemoglobin: 14.2 g/dL (ref 11.7–15.5)
MCH: 31.3 pg (ref 27.0–33.0)
MCHC: 33.4 g/dL (ref 32.0–36.0)
MCV: 93.6 fL (ref 80.0–100.0)
MPV: 10 fL (ref 7.5–12.5)
Platelets: 193 10*3/uL (ref 140–400)
RBC: 4.54 10*6/uL (ref 3.80–5.10)
RDW: 12.3 % (ref 11.0–15.0)
WBC: 8.8 10*3/uL (ref 3.8–10.8)

## 2021-03-17 LAB — HEMOGLOBIN A1C
Hgb A1c MFr Bld: 5.1 % of total Hgb (ref <5.7)
Mean Plasma Glucose: 100 mg/dL
eAG (mmol/L): 5.5 mmol/L

## 2021-03-21 ENCOUNTER — Encounter: Payer: Self-pay | Admitting: Internal Medicine

## 2021-03-23 ENCOUNTER — Other Ambulatory Visit (HOSPITAL_BASED_OUTPATIENT_CLINIC_OR_DEPARTMENT_OTHER): Payer: Self-pay

## 2021-03-31 ENCOUNTER — Encounter: Payer: Self-pay | Admitting: Internal Medicine

## 2021-04-11 ENCOUNTER — Telehealth: Payer: 59 | Admitting: Physician Assistant

## 2021-04-11 DIAGNOSIS — R300 Dysuria: Secondary | ICD-10-CM | POA: Diagnosis not present

## 2021-04-11 MED ORDER — CEPHALEXIN 500 MG PO CAPS: 500.0000 mg | ORAL_CAPSULE | Freq: Two times a day (BID) | ORAL | 0 refills | Status: AC

## 2021-04-11 NOTE — Progress Notes (Signed)
Virtual Visit Consent   Hannah Giles, you are scheduled for a virtual visit with a [facility] provider today.     Just as with appointments in the office, your consent must be obtained to participate.  Your consent will be active for this visit and any virtual visit you may have with one of our providers in the next 365 days.     If you have a MyChart account, a copy of this consent can be sent to you electronically.  All virtual visits are billed to your insurance company just like a traditional visit in the office.    As this is a virtual visit, video technology does not allow for your provider to perform a traditional examination.  This may limit your provider's ability to fully assess your condition.  If your provider identifies any concerns that need to be evaluated in person or the need to arrange testing (such as labs, EKG, etc.), we will make arrangements to do so.     Although advances in technology are sophisticated, we cannot ensure that it will always work on either your end or our end.  If the connection with a video visit is poor, the visit may have to be switched to a telephone visit.  With either a video or telephone visit, we are not always able to ensure that we have a secure connection.     I need to obtain your verbal consent now.   Are you willing to proceed with your visit today?    Hannah Giles has provided verbal consent on 04/11/2021 for a virtual visit (video or telephone).   Leeanne Rio, [place]   Date: 04/11/2021 9:10 AM   Virtual Visit via Video Note   I, Leeanne Rio, connected with  Hannah Giles  ([id number], January 07, 1961) on 04/11/21 at  9:00 AM EST by a video-enabled telemedicine application and verified that I am speaking with the correct person using two identifiers.  Location: Patient: Virtual Visit Location Patient: Home Provider: Virtual Visit Location Provider: Home Office   I discussed the limitations of evaluation and management by  telemedicine and the availability of in person appointments. The patient expressed understanding and agreed to proceed.    History of Present Illness: Hannah Giles is a 60 y.o. who identifies as a female who was assigned female at birth, and is being seen today for possible UTI. Patient endorses history of UTI but thankfully not in some time. Notes over past day with urinary urgency/frequency, hesitancy and some mild chills. Denies fever, nausea or vomiting. Denies hematuria. Denies low back pain or suprapubic pain.    HPI: HPI  Problems:  Patient Active Problem List   Diagnosis Date Noted   Overweight with body mass index (BMI) of 28 to 28.9 in adult 02/24/3006   Lichen sclerosus 62/26/3335   DM (diabetes mellitus), type 2 ([place]) 10/27/2014   Adjustment disorder with mixed anxiety and depressed mood 10/14/2014   HLD (hyperlipidemia) 10/27/2009   Essential hypertension 10/27/2009    Allergies:  Allergies  Allergen Reactions   Niacin     REACTION: flushing, SOB face numb   Medications:  Current Outpatient Medications:    cephALEXin (KEFLEX) 500 MG capsule, Take 1 capsule (500 mg total) by mouth 2 (two) times daily for 7 days., Disp: 14 capsule, Rfl: 0   atenolol-chlorthalidone (TENORETIC) 50-25 MG tablet, TAKE 1 TABLET BY MOUTH DAILY., Disp: 90 tablet, Rfl: 1   Biotin 10000 MCG TABS, , Disp: , Rfl:  Calcium Carb-Cholecalciferol 500-400 MG-UNIT TABS, , Disp: , Rfl:    Cholecalciferol (VITAMIN D) 50 MCG (2000 UT) CAPS, , Disp: , Rfl:    clobetasol ointment (TEMOVATE) 7.16 %, Apply 1 application topically 2 (two) times daily., Disp: 60 g, Rfl: 1   glucose blood (FREESTYLE LITE) test strip, 1 each by Other route 2 (two) times daily., Disp: 100 each, Rfl: 2   halobetasol (ULTRAVATE) 0.05 % ointment, APPLY A THIN LAYER TO THE AFFECTED AREA(S) ONCE DAILY. DO NOT EXCEED 50 GRAMS (1 TUBE) PER WEEK OR 2 WEEKS DURATION, Disp: 50 g, Rfl: 0   Lancets (FREESTYLE) lancets, 1 each by Other route 2  (two) times daily as needed for other., Disp: 100 each, Rfl: 2   lisinopril (ZESTRIL) 5 MG tablet, TAKE 1 TABLET (5 MG TOTAL) BY MOUTH DAILY., Disp: 90 tablet, Rfl: 1   polyethylene glycol (MIRALAX / GLYCOLAX) packet, Take 17 g by mouth daily., Disp: , Rfl:    rosuvastatin (CRESTOR) 5 MG tablet, TAKE 1 TABLET (5 MG TOTAL) BY MOUTH DAILY., Disp: 90 tablet, Rfl: 1  Observations/Objective: Patient is well-developed, well-nourished in no acute distress.  Resting comfortably at home.  Head is normocephalic, atraumatic.  No labored breathing. Speech is clear and coherent with logical content.  Patient is alert and oriented at baseline.   Assessment and Plan: 1. Dysuria - cephALEXin (KEFLEX) 500 MG capsule; Take 1 capsule (500 mg total) by mouth 2 (two) times daily for 7 days.  Dispense: 14 capsule; Refill: 0 Classic UTI symptoms. No alarm signs/symptoms present. Will treat empirically with Keflex 500 mg BID x 7 days. If not resolving or any new or worsening symptoms, she will need to be seen in person.   Follow Up Instructions: I discussed the assessment and treatment plan with the patient. The patient was provided an opportunity to ask questions and all were answered. The patient agreed with the plan and demonstrated an understanding of the instructions.  A copy of instructions were sent to the patient via MyChart unless otherwise noted below.   The patient was advised to call back or seek an in-person evaluation if the symptoms worsen or if the condition fails to improve as anticipated.  Time:  I spent 10 minutes with the patient via telehealth technology discussing the above problems/concerns.    Leeanne Rio, PA-C

## 2021-04-11 NOTE — Patient Instructions (Signed)
Hannah Giles, thank you for joining Leeanne Rio, PA-C for today's virtual visit.  While this provider is not your primary care provider (PCP), if your PCP is located in our provider database this encounter information will be shared with them immediately following your visit.  Consent: (Patient) Hannah Giles provided verbal consent for this virtual visit at the beginning of the encounter.  Current Medications:  Current Outpatient Medications:    atenolol-chlorthalidone (TENORETIC) 50-25 MG tablet, TAKE 1 TABLET BY MOUTH DAILY., Disp: 90 tablet, Rfl: 1   Biotin 10000 MCG TABS, , Disp: , Rfl:    Calcium Carb-Cholecalciferol 500-400 MG-UNIT TABS, , Disp: , Rfl:    Cholecalciferol (VITAMIN D) 50 MCG (2000 UT) CAPS, , Disp: , Rfl:    clobetasol ointment (TEMOVATE) 8.58 %, Apply 1 application topically 2 (two) times daily., Disp: 60 g, Rfl: 1   glucose blood (FREESTYLE LITE) test strip, 1 each by Other route 2 (two) times daily., Disp: 100 each, Rfl: 2   halobetasol (ULTRAVATE) 0.05 % ointment, APPLY A THIN LAYER TO THE AFFECTED AREA(S) ONCE DAILY. DO NOT EXCEED 50 GRAMS (1 TUBE) PER WEEK OR 2 WEEKS DURATION, Disp: 50 g, Rfl: 0   Lancets (FREESTYLE) lancets, 1 each by Other route 2 (two) times daily as needed for other., Disp: 100 each, Rfl: 2   lisinopril (ZESTRIL) 5 MG tablet, TAKE 1 TABLET (5 MG TOTAL) BY MOUTH DAILY., Disp: 90 tablet, Rfl: 1   polyethylene glycol (MIRALAX / GLYCOLAX) packet, Take 17 g by mouth daily., Disp: , Rfl:    rosuvastatin (CRESTOR) 5 MG tablet, TAKE 1 TABLET (5 MG TOTAL) BY MOUTH DAILY., Disp: 90 tablet, Rfl: 1   Medications ordered in this encounter:  No orders of the defined types were placed in this encounter.    *If you need refills on other medications prior to your next appointment, please contact your pharmacy*  Follow-Up: Call back or seek an in-person evaluation if the symptoms worsen or if the condition fails to improve as anticipated.  Other  Instructions Your symptoms are consistent with a bladder infection, also called acute cystitis. Please take your antibiotic (Keflex) as directed until all pills are gone.  Stay very well hydrated.  Consider a daily probiotic (Align, Culturelle, or Activia) to help prevent stomach upset caused by the antibiotic.  Taking a probiotic daily may also help prevent recurrent UTIs.  Also consider taking AZO (Phenazopyridine) tablets to help decrease pain with urination.    If symptoms are not improving/resolving   Urinary Tract Infection A urinary tract infection (UTI) can occur any place along the urinary tract. The tract includes the kidneys, ureters, bladder, and urethra. A type of germ called bacteria often causes a UTI. UTIs are often helped with antibiotic medicine.  HOME CARE  If given, take antibiotics as told by your doctor. Finish them even if you start to feel better. Drink enough fluids to keep your pee (urine) clear or pale yellow. Avoid tea, drinks with caffeine, and bubbly (carbonated) drinks. Pee often. Avoid holding your pee in for a long time. Pee before and after having sex (intercourse). Wipe from front to back after you poop (bowel movement) if you are a woman. Use each tissue only once. GET HELP RIGHT AWAY IF:  You have back pain. You have lower belly (abdominal) pain. You have chills. You feel sick to your stomach (nauseous). You throw up (vomit). Your burning or discomfort with peeing does not go away. You have a fever.  Your symptoms are not better in 3 days. MAKE SURE YOU:  Understand these instructions. Will watch your condition. Will get help right away if you are not doing well or get worse. Document Released: 11/07/2007 Document Revised: 02/13/2012 Document Reviewed: 12/20/2011 [facility] Patient Information 2015 [place], [place]. This information is not intended to replace advice given to you by your health care provider. Make sure you discuss any questions you have  with your health care provider.    If you have been instructed to have an in-person evaluation today at a local Urgent Care facility, please use the link below. It will take you to a list of all of our available [facility], including address, phone number and hours of operation. Please do not delay care.  [facility]  If you or a family member do not have a primary care provider, use the link below to schedule a visit and establish care. When you choose a [facility] primary care physician or advanced practice provider, you gain a long-term partner in health. Find a Primary Care Provider  Learn more about 's in-office and virtual care options: [facility]

## 2021-04-13 ENCOUNTER — Other Ambulatory Visit: Payer: Self-pay

## 2021-04-13 DIAGNOSIS — D2262 Melanocytic nevi of left upper limb, including shoulder: Secondary | ICD-10-CM | POA: Diagnosis not present

## 2021-04-13 DIAGNOSIS — D2271 Melanocytic nevi of right lower limb, including hip: Secondary | ICD-10-CM | POA: Diagnosis not present

## 2021-04-13 DIAGNOSIS — D2261 Melanocytic nevi of right upper limb, including shoulder: Secondary | ICD-10-CM | POA: Diagnosis not present

## 2021-04-13 DIAGNOSIS — L900 Lichen sclerosus et atrophicus: Secondary | ICD-10-CM | POA: Diagnosis not present

## 2021-04-13 DIAGNOSIS — D225 Melanocytic nevi of trunk: Secondary | ICD-10-CM | POA: Diagnosis not present

## 2021-04-13 DIAGNOSIS — D2272 Melanocytic nevi of left lower limb, including hip: Secondary | ICD-10-CM | POA: Diagnosis not present

## 2021-04-13 DIAGNOSIS — L718 Other rosacea: Secondary | ICD-10-CM | POA: Diagnosis not present

## 2021-04-13 DIAGNOSIS — L821 Other seborrheic keratosis: Secondary | ICD-10-CM | POA: Diagnosis not present

## 2021-04-13 MED ORDER — CLOBETASOL PROPIONATE 0.05 % EX CREA
TOPICAL_CREAM | CUTANEOUS | 1 refills | Status: DC
  Filled 2021-04-13: qty 60, 30d supply, fill #0
  Filled 2021-11-13: qty 60, 30d supply, fill #1

## 2021-04-23 ENCOUNTER — Ambulatory Visit
Admission: EM | Admit: 2021-04-23 | Discharge: 2021-04-23 | Disposition: A | Discharge status: Home / Self Care | Payer: 59 | Attending: Emergency Medicine | Admitting: Emergency Medicine

## 2021-04-23 ENCOUNTER — Encounter: Payer: Self-pay | Admitting: Emergency Medicine

## 2021-04-23 ENCOUNTER — Telehealth: Payer: Self-pay | Admitting: Emergency Medicine

## 2021-04-23 DIAGNOSIS — I10 Essential (primary) hypertension: Secondary | ICD-10-CM | POA: Diagnosis present

## 2021-04-23 DIAGNOSIS — R3915 Urgency of urination: Secondary | ICD-10-CM | POA: Insufficient documentation

## 2021-04-23 DIAGNOSIS — R350 Frequency of micturition: Secondary | ICD-10-CM | POA: Insufficient documentation

## 2021-04-23 LAB — POCT URINALYSIS DIP (MANUAL ENTRY)
Bilirubin, UA: NEGATIVE
Glucose, UA: NEGATIVE mg/dL
Ketones, POC UA: NEGATIVE mg/dL
Nitrite, UA: NEGATIVE
Protein Ur, POC: NEGATIVE mg/dL
Spec Grav, UA: 1.015 (ref 1.010–1.025)
Urobilinogen, UA: 0.2 E.U./dL
pH, UA: 8 (ref 5.0–8.0)

## 2021-04-23 MED ORDER — NITROFURANTOIN MONOHYD MACRO 100 MG PO CAPS: 100.0000 mg | ORAL_CAPSULE | Freq: Two times a day (BID) | ORAL | 0 refills | Status: DC

## 2021-04-23 NOTE — ED Provider Notes (Signed)
Hannah Giles    CSN: [id number] Arrival date & time: 04/23/21  1151      History   Chief Complaint Chief Complaint  Patient presents with   Dysuria    HPI Hannah Giles is a 60 y.o. female.  Patient presents with urinary frequency and urgency since this morning.  She just finished an antibiotic 4 days ago which was prescribed for similar symptoms.  She had a video visit on 04/11/2021; diagnosed with dysuria; treated with cephalexin.  Patient reports no fever, abdominal pain, painful urination, blood in urine, flank pain, pelvic pain, or other symptoms.  Her medical history includes hypertension and diabetes.  The history is provided by the patient and medical records.   Past Medical History:  Diagnosis Date   Hyperlipidemia    Hypertension    Urinary incontinence     Patient Active Problem List   Diagnosis Date Noted   Overweight with body mass index (BMI) of 28 to 28.9 in adult 94/85/4627   Lichen sclerosus 03/50/0938   DM (diabetes mellitus), type 2 ([place]) 10/27/2014   Adjustment disorder with mixed anxiety and depressed mood 10/14/2014   HLD (hyperlipidemia) 10/27/2009   Essential hypertension 10/27/2009    Past Surgical History:  Procedure Laterality Date   APPENDECTOMY     BACK SURGERY     COLONOSCOPY     removal right ovary and tube     TEMPOROMANDIBULAR JOINT SURGERY     TONSILLECTOMY      OB History   No obstetric history on file.      Home Medications    Prior to Admission medications   Medication Sig Start Date End Date Taking? Authorizing Provider  atenolol-chlorthalidone (TENORETIC) 50-25 MG tablet TAKE 1 TABLET BY MOUTH DAILY. 03/16/21 03/16/22  Jearld Fenton, NP  Biotin 10000 MCG TABS     [provider]  Calcium Carb-Cholecalciferol 500-400 MG-UNIT TABS     [provider]  Cholecalciferol (VITAMIN D) 50 MCG (2000 UT) CAPS     [provider]  clobetasol cream (TEMOVATE) 0.05 % Apply twice daily to  affected areas on arms and groin until resolved, then discontinue 04/13/21     clobetasol ointment (TEMOVATE) 1.82 % Apply 1 application topically 2 (two) times daily. 02/25/20   Jearld Fenton, NP  glucose blood (FREESTYLE LITE) test strip 1 each by Other route 2 (two) times daily. 12/29/18   Jearld Fenton, NP  halobetasol (ULTRAVATE) 0.05 % ointment APPLY A THIN LAYER TO THE AFFECTED AREA(S) ONCE DAILY. DO NOT EXCEED 50 GRAMS (1 TUBE) PER WEEK OR 2 WEEKS DURATION 08/11/20 08/11/21  Chrzanowski, Jami B, NP  Lancets (FREESTYLE) lancets 1 each by Other route 2 (two) times daily as needed for other. 10/08/18   Jearld Fenton, NP  lisinopril (ZESTRIL) 5 MG tablet TAKE 1 TABLET (5 MG TOTAL) BY MOUTH DAILY. 03/16/21 03/16/22  Jearld Fenton, NP  polyethylene glycol (MIRALAX / Floria Raveling) packet Take 17 g by mouth daily.    [provider]  rosuvastatin (CRESTOR) 5 MG tablet TAKE 1 TABLET (5 MG TOTAL) BY MOUTH DAILY. 03/16/21 03/16/22  Jearld Fenton, NP    Family History Family History  Problem Relation Age of Onset   Cancer Mother        colon   Colon cancer Mother    Heart disease Father        s/p stents in the 60's   Dementia Father    Diabetes Paternal Uncle  Hypertension Sister    Pancreatic cancer Paternal Grandfather    Rectal cancer Neg Hx    Stomach cancer Neg Hx     Social History Social History   Tobacco Use   Smoking status: Former    Packs/day: 0.50    Years: 13.00    Pack years: 6.50    Types: Cigarettes    Quit date: 06/04/1989    Years since quitting: 31.9   Smokeless tobacco: Never  Vaping Use   Vaping Use: Never used  Substance Use Topics   Alcohol use: Yes    Alcohol/week: 3.0 - 4.0 standard drinks    Types: 3 - 4 Glasses of wine per week    Comment: socially   Drug use: No     Allergies   Niacin   Review of Systems Review of Systems  Constitutional:  Negative for chills and fever.  Respiratory:  Negative for cough and shortness of breath.    Cardiovascular:  Negative for chest pain and palpitations.  Gastrointestinal:  Negative for abdominal pain and vomiting.  Genitourinary:  Positive for frequency. Negative for dysuria, flank pain, hematuria, pelvic pain and vaginal discharge.  All other systems reviewed and are negative.   Physical Exam Triage Vital Signs ED Triage Vitals  Enc Vitals Group     BP 04/23/21 1248 (!) 161/89     Pulse Rate 04/23/21 1248 84     Resp 04/23/21 1248 20     Temp 04/23/21 1248 98.3 F (36.8 C)     Temp src --      SpO2 04/23/21 1248 97 %     Weight --      Height --      Head Circumference --      Peak Flow --      Pain Score 04/23/21 1253 3     Pain Loc --      Pain Edu? --      Excl. in [place]? --    No data found.  Updated Vital Signs BP (!) 161/89   Pulse 84   Temp 98.3 F (36.8 C)   Resp 20   SpO2 97%   Visual Acuity Right Eye Distance:   Left Eye Distance:   Bilateral Distance:    Right Eye Near:   Left Eye Near:    Bilateral Near:     Physical Exam Vitals and nursing note reviewed.  Constitutional:      General: She is not in acute distress.    Appearance: She is well-developed. She is not ill-appearing.  HENT:     Head: Normocephalic and atraumatic.     Mouth/Throat:     Mouth: Mucous membranes are moist.  Cardiovascular:     Rate and Rhythm: Normal rate and regular rhythm.     Heart sounds: Normal heart sounds.  Pulmonary:     Effort: Pulmonary effort is normal. No respiratory distress.     Breath sounds: Normal breath sounds.  Abdominal:     Palpations: Abdomen is soft.     Tenderness: There is no abdominal tenderness. There is no right CVA tenderness, left CVA tenderness, guarding or rebound.  Musculoskeletal:     Cervical back: Neck supple.  Skin:    General: Skin is warm and dry.  Neurological:     Mental Status: She is alert.  Psychiatric:        Mood and Affect: Mood normal.        Behavior: Behavior normal.  UC Treatments / Results   Labs (all labs ordered are listed, but only abnormal results are displayed) Labs Reviewed  POCT URINALYSIS DIP (MANUAL ENTRY) - Abnormal; Notable for the following components:      Result Value   Blood, UA trace-intact (*)    Leukocytes, UA Small (1+) (*)    All other components within normal limits    EKG   Radiology No results found.  Procedures Procedures (including critical care time)  Medications Ordered in UC Medications - No data to display  Initial Impression / Assessment and Plan / UC Course  I have reviewed the triage vital signs and the nursing notes.  Pertinent labs & imaging results that were available during my care of the patient were reviewed by me and considered in my medical decision making (see chart for details).  Urinary frequency, urinary urgency.  Elevated blood pressure reading with hypertension.  No indication of urinary tract infection at this time.  Instructed patient to increase her water intake and consider OTC Azo.  Instructed her to follow-up with her PCP if her symptoms are not improving.  Also discussed that her blood pressure is elevated today and needs to be rechecked by her PCP in 2 to 4 weeks.  Education provided on managing hypertension.  Patient agrees to plan of care.     Final Clinical Impressions(s) / UC Diagnoses   Final diagnoses:  Urinary frequency  Urinary urgency  Elevated blood pressure reading in office with diagnosis of hypertension     Discharge Instructions      Your urine does not show signs of infection today.  Follow up with your primary care provider if your symptoms are not improving.    Your blood pressure is elevated today at 161/89.  Please have this rechecked by your primary care provider in 2-4 weeks.          ED Prescriptions   None    PDMP not reviewed this encounter.   Sharion Balloon, NP 04/23/21 1323

## 2021-04-23 NOTE — Discharge Instructions (Addendum)
Your urine does not show signs of infection today.  Follow up with your primary care provider if your symptoms are not improving.    Your blood pressure is elevated today at 161/89.  Please have this rechecked by your primary care provider in 2-4 weeks.

## 2021-04-23 NOTE — ED Triage Notes (Signed)
Pt just finished Keflex on Thursday for a UTI, but it almost never takes care of her UTIs. So patient is here today with recurring increased frequency and burning x 2 days.

## 2021-04-23 NOTE — Telephone Encounter (Signed)
Urine culture ordered.  Treating with 5 days of Macrobid.  Discussed with patient that she will need to stop the antibiotic if her urine culture is negative.  Patient agrees to plan of care.

## 2021-04-25 LAB — URINE CULTURE: Culture: 30000 — AB

## 2021-05-04 DIAGNOSIS — Z800 Family history of malignant neoplasm of digestive organs: Secondary | ICD-10-CM | POA: Diagnosis not present

## 2021-05-04 DIAGNOSIS — Z86010 Personal history of colonic polyps: Secondary | ICD-10-CM | POA: Diagnosis not present

## 2021-05-04 DIAGNOSIS — Z8719 Personal history of other diseases of the digestive system: Secondary | ICD-10-CM | POA: Diagnosis not present

## 2021-05-13 ENCOUNTER — Telehealth: Payer: 59 | Admitting: Nurse Practitioner

## 2021-05-13 DIAGNOSIS — N390 Urinary tract infection, site not specified: Secondary | ICD-10-CM

## 2021-05-13 MED ORDER — CIPROFLOXACIN HCL 250 MG PO TABS: 250.0000 mg | ORAL_TABLET | Freq: Two times a day (BID) | ORAL | 0 refills | Status: AC

## 2021-05-13 NOTE — Progress Notes (Signed)
Virtual Visit Consent   Hannah Giles, you are scheduled for a virtual visit with a [facility] provider today.     Just as with appointments in the office, your consent must be obtained to participate.  Your consent will be active for this visit and any virtual visit you may have with one of our providers in the next 365 days.     If you have a MyChart account, a copy of this consent can be sent to you electronically.  All virtual visits are billed to your insurance company just like a traditional visit in the office.    As this is a virtual visit, video technology does not allow for your provider to perform a traditional examination.  This may limit your provider's ability to fully assess your condition.  If your provider identifies any concerns that need to be evaluated in person or the need to arrange testing (such as labs, EKG, etc.), we will make arrangements to do so.     Although advances in technology are sophisticated, we cannot ensure that it will always work on either your end or our end.  If the connection with a video visit is poor, the visit may have to be switched to a telephone visit.  With either a video or telephone visit, we are not always able to ensure that we have a secure connection.     I need to obtain your verbal consent now.   Are you willing to proceed with your visit today?    Adea Geisel has provided verbal consent on 05/13/2021 for a virtual visit (video or telephone).   Gildardo Pounds, NP   Date: 05/13/2021 10:14 AM   Virtual Visit via Video Note   I, Gildardo Pounds, connected with  Sharman Cheek  ([id number], [birth year]) on 05/13/21 at 10:15 AM EST by a video-enabled telemedicine application and verified that I am speaking with the correct person using two identifiers.  Location: Patient: Virtual Visit Location Patient: Home Provider: Virtual Visit Location Provider: Home Office   I discussed the limitations of evaluation and management by telemedicine  and the availability of in person appointments. The patient expressed understanding and agreed to proceed.    History of Present Illness: Hannah Giles is a 60 y.o. who identifies as a female who was assigned female at birth, and is being seen today for recurrent UTI symptoms.  HPI:  She was recently treated for E. coli UTI last month with Macrobid.  She notes complete resolution of UTI symptoms after completing the antibiotic.  However she feels like her symptoms have returned and she is now experiencing urinary frequency, dysuria, lower pelvic pressure. She does have a history of frequent UTI in the past. She vaginal rejuvenation laser surgery early November and is not sure if this could have anything to do with her current symptoms.  She denies fever, hematuria or flank pain.  States in the past when she had a recurring UTIs she was given Cipro.  She would like to have this prescribed today.    Problems:  Patient Active Problem List   Diagnosis Date Noted   Overweight with body mass index (BMI) of 28 to 28.9 in adult 35/36/1443   Lichen sclerosus 15/40/0867   DM (diabetes mellitus), type 2 ([place]) 10/27/2014   Adjustment disorder with mixed anxiety and depressed mood 10/14/2014   HLD (hyperlipidemia) 10/27/2009   Essential hypertension 10/27/2009    Allergies:  Allergies  Allergen Reactions   Niacin  REACTION: flushing, SOB face numb   Medications:  Current Outpatient Medications:    ciprofloxacin (CIPRO) 250 MG tablet, Take 1 tablet (250 mg total) by mouth 2 (two) times daily for 3 days., Disp: 6 tablet, Rfl: 0   atenolol-chlorthalidone (TENORETIC) 50-25 MG tablet, TAKE 1 TABLET BY MOUTH DAILY., Disp: 90 tablet, Rfl: 1   Biotin 10000 MCG TABS, , Disp: , Rfl:    Calcium Carb-Cholecalciferol 500-400 MG-UNIT TABS, , Disp: , Rfl:    Cholecalciferol (VITAMIN D) 50 MCG (2000 UT) CAPS, , Disp: , Rfl:    clobetasol cream (TEMOVATE) 0.05 %, Apply twice daily to affected areas on arms and  groin until resolved, then discontinue, Disp: 60 g, Rfl: 1   clobetasol ointment (TEMOVATE) 1.74 %, Apply 1 application topically 2 (two) times daily., Disp: 60 g, Rfl: 1   glucose blood (FREESTYLE LITE) test strip, 1 each by Other route 2 (two) times daily., Disp: 100 each, Rfl: 2   halobetasol (ULTRAVATE) 0.05 % ointment, APPLY A THIN LAYER TO THE AFFECTED AREA(S) ONCE DAILY. DO NOT EXCEED 50 GRAMS (1 TUBE) PER WEEK OR 2 WEEKS DURATION, Disp: 50 g, Rfl: 0   Lancets (FREESTYLE) lancets, 1 each by Other route 2 (two) times daily as needed for other., Disp: 100 each, Rfl: 2   lisinopril (ZESTRIL) 5 MG tablet, TAKE 1 TABLET (5 MG TOTAL) BY MOUTH DAILY., Disp: 90 tablet, Rfl: 1   nitrofurantoin, macrocrystal-monohydrate, (MACROBID) 100 MG capsule, Take 1 capsule (100 mg total) by mouth 2 (two) times daily., Disp: 10 capsule, Rfl: 0   polyethylene glycol (MIRALAX / GLYCOLAX) packet, Take 17 g by mouth daily., Disp: , Rfl:    rosuvastatin (CRESTOR) 5 MG tablet, TAKE 1 TABLET (5 MG TOTAL) BY MOUTH DAILY., Disp: 90 tablet, Rfl: 1  Observations/Objective: Patient is well-developed, well-nourished in no acute distress.  Resting comfortably  at home.  Head is normocephalic, atraumatic.  No labored breathing.  Speech is clear and coherent with logical content.  Patient is alert and oriented at baseline.    Assessment and Plan: 1. Recurrent UTI - ciprofloxacin (CIPRO) 250 MG tablet; Take 1 tablet (250 mg total) by mouth 2 (two) times daily for 3 days.  Dispense: 6 tablet; Refill: 0   Follow Up Instructions: I discussed the assessment and treatment plan with the patient. The patient was provided an opportunity to ask questions and all were answered. The patient agreed with the plan and demonstrated an understanding of the instructions.  A copy of instructions were sent to the patient via MyChart unless otherwise noted below.     The patient was advised to call back or seek an in-person evaluation if  the symptoms worsen or if the condition fails to improve as anticipated.  Time:  I spent 10 minutes with the patient via telehealth technology discussing the above problems/concerns.    Gildardo Pounds, NP

## 2021-05-13 NOTE — Patient Instructions (Addendum)
Hannah Giles, thank you for joining Gildardo Pounds, NP for today's virtual visit.  While this provider is not your primary care provider (PCP), if your PCP is located in our provider database this encounter information will be shared with them immediately following your visit.  Consent: (Patient) Hannah Giles provided verbal consent for this virtual visit at the beginning of the encounter.  Current Medications:  Current Outpatient Medications:    ciprofloxacin (CIPRO) 250 MG tablet, Take 1 tablet (250 mg total) by mouth 2 (two) times daily for 3 days., Disp: 6 tablet, Rfl: 0   atenolol-chlorthalidone (TENORETIC) 50-25 MG tablet, TAKE 1 TABLET BY MOUTH DAILY., Disp: 90 tablet, Rfl: 1   Biotin 10000 MCG TABS, , Disp: , Rfl:    Calcium Carb-Cholecalciferol 500-400 MG-UNIT TABS, , Disp: , Rfl:    Cholecalciferol (VITAMIN D) 50 MCG (2000 UT) CAPS, , Disp: , Rfl:    clobetasol cream (TEMOVATE) 0.05 %, Apply twice daily to affected areas on arms and groin until resolved, then discontinue, Disp: 60 g, Rfl: 1   clobetasol ointment (TEMOVATE) 0.62 %, Apply 1 application topically 2 (two) times daily., Disp: 60 g, Rfl: 1   glucose blood (FREESTYLE LITE) test strip, 1 each by Other route 2 (two) times daily., Disp: 100 each, Rfl: 2   halobetasol (ULTRAVATE) 0.05 % ointment, APPLY A THIN LAYER TO THE AFFECTED AREA(S) ONCE DAILY. DO NOT EXCEED 50 GRAMS (1 TUBE) PER WEEK OR 2 WEEKS DURATION, Disp: 50 g, Rfl: 0   Lancets (FREESTYLE) lancets, 1 each by Other route 2 (two) times daily as needed for other., Disp: 100 each, Rfl: 2   lisinopril (ZESTRIL) 5 MG tablet, TAKE 1 TABLET (5 MG TOTAL) BY MOUTH DAILY., Disp: 90 tablet, Rfl: 1   nitrofurantoin, macrocrystal-monohydrate, (MACROBID) 100 MG capsule, Take 1 capsule (100 mg total) by mouth 2 (two) times daily., Disp: 10 capsule, Rfl: 0   polyethylene glycol (MIRALAX / GLYCOLAX) packet, Take 17 g by mouth daily., Disp: , Rfl:    rosuvastatin (CRESTOR) 5 MG  tablet, TAKE 1 TABLET (5 MG TOTAL) BY MOUTH DAILY., Disp: 90 tablet, Rfl: 1   Medications ordered in this encounter:  Meds ordered this encounter  Medications   ciprofloxacin (CIPRO) 250 MG tablet    Sig: Take 1 tablet (250 mg total) by mouth 2 (two) times daily for 3 days.    Dispense:  6 tablet    Refill:  0    Order Specific Question:   Supervising Provider    Answer:   Sabra Heck, BRIAN [[id number]]     *If you need refills on other medications prior to your next appointment, please contact your pharmacy*  Follow-Up: Call back or seek an in-person evaluation if the symptoms worsen or if the condition fails to improve as anticipated.  Other Instructions Remember to always wipe from front to back after urination   If you have been instructed to have an in-person evaluation today at a local Urgent Care facility, please use the link below. It will take you to a list of all of our available [facility], including address, phone number and hours of operation. Please do not delay care.  [facility]  If you or a family member do not have a primary care provider, use the link below to schedule a visit and establish care. When you choose a [facility] primary care physician or advanced practice provider, you gain a long-term partner in health. Find a Primary  Care Provider  Learn more about [facility]'s in-office and virtual care options: [facility]

## 2021-05-14 ENCOUNTER — Encounter: Payer: Self-pay | Admitting: Internal Medicine

## 2021-05-18 ENCOUNTER — Ambulatory Visit: Payer: 59 | Admitting: Internal Medicine

## 2021-05-18 ENCOUNTER — Other Ambulatory Visit: Payer: Self-pay

## 2021-05-18 ENCOUNTER — Encounter: Payer: Self-pay | Admitting: Internal Medicine

## 2021-05-18 VITALS — BP 130/68 | HR 68 | Temp 97.5°F | Ht 69.0 in | Wt 193.1 lb

## 2021-05-18 DIAGNOSIS — N390 Urinary tract infection, site not specified: Secondary | ICD-10-CM | POA: Diagnosis not present

## 2021-05-18 LAB — POCT URINALYSIS DIPSTICK
Bilirubin, UA: NEGATIVE
Blood, UA: NEGATIVE
Glucose, UA: NEGATIVE
Ketones, UA: NEGATIVE
Leukocytes, UA: NEGATIVE
Nitrite, UA: NEGATIVE
Protein, UA: NEGATIVE
Spec Grav, UA: 1.01 (ref 1.010–1.025)
Urobilinogen, UA: 0.2 E.U./dL
pH, UA: 8 (ref 5.0–8.0)

## 2021-05-18 MED ORDER — CIPROFLOXACIN HCL 500 MG PO TABS
500.0000 mg | ORAL_TABLET | Freq: Two times a day (BID) | ORAL | 0 refills | Status: AC
  Filled 2021-05-18: qty 6, 3d supply, fill #0

## 2021-05-18 NOTE — Progress Notes (Signed)
Subjective:    Patient ID: Hannah Giles, female    DOB: 1960-09-06, 60 y.o.   MRN: [id number]  HPI  Patient presents the clinic today for urgent care follow-up.  She had an E-visit 11/8 for dysuria.  She was treated with Keflex for a presumed UTI.  She subsequently presented to the urgent care 11/20 with complaint of urinary urgency and frequency.  Urinalysis showed small leukocytes and trace blood.  She was not given antibiotics at this visit but advised to follow-up with her PCP. She went back and requested antibiotics, they sent her in [place]. Her urine culture grew 30,000 colonies of E. coli.  She did an E-visit 12/10 for the same.  She was treated with Ciprofloxacin for presumed UTI. She is not currently having any urinary urgency, frequency, dysuria and blood in her urine.   Review of Systems     Past Medical History:  Diagnosis Date   Hyperlipidemia    Hypertension    Urinary incontinence     Current Outpatient Medications  Medication Sig Dispense Refill   atenolol-chlorthalidone (TENORETIC) 50-25 MG tablet TAKE 1 TABLET BY MOUTH DAILY. 90 tablet 1   Biotin 10000 MCG TABS      Calcium Carb-Cholecalciferol 500-400 MG-UNIT TABS      Cholecalciferol (VITAMIN D) 50 MCG (2000 UT) CAPS      clobetasol cream (TEMOVATE) 0.05 % Apply twice daily to affected areas on arms and groin until resolved, then discontinue 60 g 1   clobetasol ointment (TEMOVATE) 7.86 % Apply 1 application topically 2 (two) times daily. 60 g 1   glucose blood (FREESTYLE LITE) test strip 1 each by Other route 2 (two) times daily. 100 each 2   halobetasol (ULTRAVATE) 0.05 % ointment APPLY A THIN LAYER TO THE AFFECTED AREA(S) ONCE DAILY. DO NOT EXCEED 50 GRAMS (1 TUBE) PER WEEK OR 2 WEEKS DURATION 50 g 0   Lancets (FREESTYLE) lancets 1 each by Other route 2 (two) times daily as needed for other. 100 each 2   lisinopril (ZESTRIL) 5 MG tablet TAKE 1 TABLET (5 MG TOTAL) BY MOUTH DAILY. 90 tablet 1   nitrofurantoin,  macrocrystal-monohydrate, (MACROBID) 100 MG capsule Take 1 capsule (100 mg total) by mouth 2 (two) times daily. (Patient not taking: Reported on 05/18/2021) 10 capsule 0   polyethylene glycol (MIRALAX / GLYCOLAX) packet Take 17 g by mouth daily.     rosuvastatin (CRESTOR) 5 MG tablet TAKE 1 TABLET (5 MG TOTAL) BY MOUTH DAILY. 90 tablet 1   No current facility-administered medications for this visit.    Allergies  Allergen Reactions   Niacin     REACTION: flushing, SOB face numb    Family History  Problem Relation Age of Onset   Cancer Mother        colon   Colon cancer Mother    Heart disease Father        s/p stents in the 36's   Dementia Father    Diabetes Paternal Uncle    Hypertension Sister    Pancreatic cancer Paternal Grandfather    Rectal cancer Neg Hx    Stomach cancer Neg Hx     Social History   Socioeconomic History   Marital status: Married    Spouse name: Not on file   Number of children: Not on file   Years of education: Not on file   Highest education level: Not on file  Occupational History   Occupation: Therapist, sports, [employer]  Tobacco Use  Smoking status: Former    Packs/day: 0.50    Years: 13.00    Pack years: 6.50    Types: Cigarettes    Quit date: 06/04/1989    Years since quitting: 31.9   Smokeless tobacco: Never  Vaping Use   Vaping Use: Never used  Substance and Sexual Activity   Alcohol use: Yes    Alcohol/week: 3.0 - 4.0 standard drinks    Types: 3 - 4 Glasses of wine per week    Comment: socially   Drug use: No   Sexual activity: Not on file  Other Topics Concern   Not on file  Social History Narrative   Not on file   Social Determinants of Health   Financial Resource Strain: Not on file  Food Insecurity: Not on file  Transportation Needs: Not on file  Physical Activity: Not on file  Stress: Not on file  Social Connections: Not on file  Intimate Partner Violence: Not on file     Constitutional: Denies fever, malaise,  fatigue, headache or abrupt weight changes.  Respiratory: Denies difficulty breathing, shortness of breath, cough or sputum production.   Cardiovascular: Denies chest pain, chest tightness, palpitations or swelling in the hands or feet.  Gastrointestinal: Denies abdominal pain, bloating, constipation, diarrhea or blood in the stool.  GU: Denies urgency, frequency, pain with urination, burning sensation, blood in urine, odor or discharge.  No other specific complaints in a complete review of systems (except as listed in HPI above).  Objective:   Physical Exam   BP 130/68 (BP Location: Left Arm, Patient Position: Sitting, Cuff Size: Normal)    Pulse 68    Temp (!) 97.5 F (36.4 C) (Temporal)    Ht 5\' 9"  (1.753 m)    Wt 193 lb 1 oz (87.6 kg)    SpO2 99%    BMI 28.51 kg/m   Wt Readings from Last 3 Encounters:  03/16/21 194 lb 3.2 oz (88.1 kg)  08/25/20 191 lb (86.6 kg)  02/25/20 186 lb (84.4 kg)    General: Appears her stated age, overweight, in NAD. Cardiovascular: Normal rate Pulmonary/Chest: Normal effort Neurological: Alert and oriented.    BMET    Component Value Date/Time   NA 142 03/16/2021 0935   NA 138 09/01/2013 0017   K 4.0 03/16/2021 0935   K 3.0 (L) 09/01/2013 0017   CL 100 03/16/2021 0935   CL 100 09/01/2013 0017   CO2 32 03/16/2021 0935   CO2 33 (H) 09/01/2013 0017   GLUCOSE 95 03/16/2021 0935   GLUCOSE 208 (H) 09/01/2013 0017   BUN 14 03/16/2021 0935   BUN 18 09/01/2013 0017   CREATININE 0.52 03/16/2021 0935   CALCIUM 9.8 03/16/2021 0935   CALCIUM 9.3 09/01/2013 0017   GFRNONAA >60 09/01/2013 0017   GFRAA >60 09/01/2013 0017    Lipid Panel     Component Value Date/Time   CHOL 202 (H) 03/16/2021 0935   TRIG 94 03/16/2021 0935   HDL 67 03/16/2021 0935   CHOLHDL 3.0 03/16/2021 0935   VLDL 21.6 08/25/2020 0928   LDLCALC 115 (H) 03/16/2021 0935    CBC    Component Value Date/Time   WBC 8.8 03/16/2021 0935   RBC 4.54 03/16/2021 0935   HGB 14.2  03/16/2021 0935   HGB 13.8 09/01/2013 0017   HCT 42.5 03/16/2021 0935   HCT 40.9 09/01/2013 0017   PLT 193 03/16/2021 0935   PLT 216 09/01/2013 0017   MCV 93.6 03/16/2021 0935  MCV 85 09/01/2013 0017   MCH 31.3 03/16/2021 0935   MCHC 33.4 03/16/2021 0935   RDW 12.3 03/16/2021 0935   RDW 13.9 09/01/2013 0017   LYMPHSABS 2.3 04/01/2015 0738   MONOABS 0.3 04/01/2015 0738   EOSABS 0.1 04/01/2015 0738   BASOSABS 0.0 04/01/2015 0738    Hgb A1C Lab Results  Component Value Date   HGBA1C 5.1 03/16/2021           Assessment & Plan:   Urgent care follow-up for UTI:  Urgent care and ED visit notes and labs reviewed Urinalysis: normal Push fluids Okay to take AZO OTC RX for Cipro 500 mg BID x 3 days to have on hand in case symptoms reoccur  We will follow-up after culture is back with further recommendation and treatment plan Webb Silversmith, NP This visit occurred during the SARS-CoV-2 public health emergency.  Safety protocols were in place, including screening questions prior to the visit, additional usage of staff PPE, and extensive cleaning of exam room while observing appropriate contact time as indicated for disinfecting solutions.

## 2021-05-18 NOTE — Patient Instructions (Signed)

## 2021-06-19 ENCOUNTER — Other Ambulatory Visit: Payer: Self-pay

## 2021-08-10 ENCOUNTER — Other Ambulatory Visit: Payer: Self-pay

## 2021-08-10 MED ORDER — PEG 3350-KCL-NA BICARB-NACL 420 G PO SOLR
ORAL | 0 refills | Status: DC
  Filled 2021-08-10: qty 4000, 1d supply, fill #0

## 2021-08-17 DIAGNOSIS — K649 Unspecified hemorrhoids: Secondary | ICD-10-CM | POA: Diagnosis not present

## 2021-08-17 DIAGNOSIS — D123 Benign neoplasm of transverse colon: Secondary | ICD-10-CM | POA: Diagnosis not present

## 2021-08-17 DIAGNOSIS — Z86010 Personal history of colonic polyps: Secondary | ICD-10-CM | POA: Diagnosis not present

## 2021-08-17 DIAGNOSIS — Z800 Family history of malignant neoplasm of digestive organs: Secondary | ICD-10-CM | POA: Diagnosis not present

## 2021-08-17 LAB — HM COLONOSCOPY

## 2021-08-24 DIAGNOSIS — Z1151 Encounter for screening for human papillomavirus (HPV): Secondary | ICD-10-CM | POA: Diagnosis not present

## 2021-08-24 DIAGNOSIS — Z6827 Body mass index (BMI) 27.0-27.9, adult: Secondary | ICD-10-CM | POA: Diagnosis not present

## 2021-08-24 DIAGNOSIS — Z1231 Encounter for screening mammogram for malignant neoplasm of breast: Secondary | ICD-10-CM | POA: Diagnosis not present

## 2021-08-24 DIAGNOSIS — Z124 Encounter for screening for malignant neoplasm of cervix: Secondary | ICD-10-CM | POA: Diagnosis not present

## 2021-08-24 DIAGNOSIS — Z01419 Encounter for gynecological examination (general) (routine) without abnormal findings: Secondary | ICD-10-CM | POA: Diagnosis not present

## 2021-08-24 DIAGNOSIS — R319 Hematuria, unspecified: Secondary | ICD-10-CM | POA: Diagnosis not present

## 2021-08-26 LAB — HM PAP SMEAR: HPV, high-risk: NEGATIVE

## 2021-09-10 ENCOUNTER — Encounter: Payer: Self-pay | Admitting: Internal Medicine

## 2021-09-14 ENCOUNTER — Encounter: Payer: Self-pay | Admitting: Internal Medicine

## 2021-09-14 ENCOUNTER — Other Ambulatory Visit: Payer: Self-pay

## 2021-09-14 ENCOUNTER — Ambulatory Visit (INDEPENDENT_AMBULATORY_CARE_PROVIDER_SITE_OTHER): Payer: 59 | Admitting: Internal Medicine

## 2021-09-14 VITALS — BP 122/74 | HR 60 | Temp 97.1°F | Wt 189.0 lb

## 2021-09-14 DIAGNOSIS — E663 Overweight: Secondary | ICD-10-CM | POA: Diagnosis not present

## 2021-09-14 DIAGNOSIS — Z6827 Body mass index (BMI) 27.0-27.9, adult: Secondary | ICD-10-CM | POA: Diagnosis not present

## 2021-09-14 DIAGNOSIS — E7800 Pure hypercholesterolemia, unspecified: Secondary | ICD-10-CM

## 2021-09-14 DIAGNOSIS — F4323 Adjustment disorder with mixed anxiety and depressed mood: Secondary | ICD-10-CM | POA: Diagnosis not present

## 2021-09-14 DIAGNOSIS — E119 Type 2 diabetes mellitus without complications: Secondary | ICD-10-CM

## 2021-09-14 DIAGNOSIS — N3946 Mixed incontinence: Secondary | ICD-10-CM

## 2021-09-14 DIAGNOSIS — M8589 Other specified disorders of bone density and structure, multiple sites: Secondary | ICD-10-CM

## 2021-09-14 DIAGNOSIS — L900 Lichen sclerosus et atrophicus: Secondary | ICD-10-CM

## 2021-09-14 DIAGNOSIS — I10 Essential (primary) hypertension: Secondary | ICD-10-CM | POA: Diagnosis not present

## 2021-09-14 DIAGNOSIS — M8580 Other specified disorders of bone density and structure, unspecified site: Secondary | ICD-10-CM | POA: Insufficient documentation

## 2021-09-14 MED ORDER — LISINOPRIL 5 MG PO TABS
ORAL_TABLET | Freq: Every day | ORAL | 1 refills | Status: DC
  Filled 2021-09-14: qty 90, 90d supply, fill #0
  Filled 2021-11-13 – 2021-12-13 (×2): qty 90, 90d supply, fill #1

## 2021-09-14 MED ORDER — ROSUVASTATIN CALCIUM 5 MG PO TABS
ORAL_TABLET | Freq: Every day | ORAL | 1 refills | Status: DC
  Filled 2021-09-14: qty 90, 90d supply, fill #0
  Filled 2021-11-13 – 2021-12-13 (×2): qty 90, 90d supply, fill #1

## 2021-09-14 MED ORDER — ATENOLOL-CHLORTHALIDONE 50-25 MG PO TABS
1.0000 | ORAL_TABLET | Freq: Every day | ORAL | 1 refills | Status: DC
  Filled 2021-09-14: qty 90, 90d supply, fill #0
  Filled 2021-11-13 – 2021-12-13 (×2): qty 90, 90d supply, fill #1

## 2021-09-14 NOTE — Assessment & Plan Note (Signed)
Continue Calcium and Vitamin D OTC ?Encourage daily weightbearing exercise ?

## 2021-09-14 NOTE — Patient Instructions (Signed)
Heart-Healthy Eating Plan Many factors influence your heart (coronary) health, including eating and exercise habits. Coronary risk increases with abnormal blood fat (lipid) levels. Heart-healthy meal planning includes limiting unhealthy fats, increasing healthy fats, and making other diet and lifestyle changes. What is my plan? Your health care provider may recommend that you: Limit your fat intake to _________% or less of your total calories each day. Limit your saturated fat intake to _________% or less of your total calories each day. Limit the amount of cholesterol in your diet to less than _________ mg per day. What are tips for following this plan? Cooking Cook foods using methods other than frying. Baking, boiling, grilling, and broiling are all good options. Other ways to reduce fat include: Removing the skin from poultry. Removing all visible fats from meats. Steaming vegetables in water or broth. Meal planning  At meals, imagine dividing your plate into fourths: Fill one-half of your plate with vegetables and green salads. Fill one-fourth of your plate with whole grains. Fill one-fourth of your plate with lean protein foods. Eat 4-5 servings of vegetables per day. One serving equals 1 cup raw or cooked vegetable, or 2 cups raw leafy greens. Eat 4-5 servings of fruit per day. One serving equals 1 medium whole fruit,  cup dried fruit,  cup fresh, frozen, or canned fruit, or  cup 100% fruit juice. Eat more foods that contain soluble fiber. Examples include apples, broccoli, carrots, beans, peas, and barley. Aim to get 25-30 g of fiber per day. Increase your consumption of legumes, nuts, and seeds to 4-5 servings per week. One serving of dried beans or legumes equals  cup cooked, 1 serving of nuts is  cup, and 1 serving of seeds equals 1 tablespoon. Fats Choose healthy fats more often. Choose monounsaturated and polyunsaturated fats, such as olive and canola oils, flaxseeds,  walnuts, almonds, and seeds. Eat more omega-3 fats. Choose salmon, mackerel, sardines, tuna, flaxseed oil, and ground flaxseeds. Aim to eat fish at least 2 times each week. Check food labels carefully to identify foods with trans fats or high amounts of saturated fat. Limit saturated fats. These are found in animal products, such as meats, butter, and cream. Plant sources of saturated fats include palm oil, palm kernel oil, and coconut oil. Avoid foods with partially hydrogenated oils in them. These contain trans fats. Examples are stick margarine, some tub margarines, cookies, crackers, and other baked goods. Avoid fried foods. General information Eat more home-cooked food and less restaurant, buffet, and fast food. Limit or avoid alcohol. Limit foods that are high in starch and sugar. Lose weight if you are overweight. Losing just 5-10% of your body weight can help your overall health and prevent diseases such as diabetes and heart disease. Monitor your salt (sodium) intake, especially if you have high blood pressure. Talk with your health care provider about your sodium intake. Try to incorporate more vegetarian meals weekly. What foods can I eat? Fruits All fresh, canned (in natural juice), or frozen fruits. Vegetables Fresh or frozen vegetables (raw, steamed, roasted, or grilled). Green salads. Grains Most grains. Choose whole wheat and whole grains most of the time. Rice and pasta, including brown rice and pastas made with whole wheat. Meats and other proteins Lean, well-trimmed beef, veal, pork, and lamb. Chicken and turkey without skin. All fish and shellfish. Wild duck, rabbit, pheasant, and venison. Egg whites or low-cholesterol egg substitutes. Dried beans, peas, lentils, and tofu. Seeds and most nuts. Dairy Low-fat or nonfat cheeses,   including ricotta and mozzarella. Skim or 1% milk (liquid, powdered, or evaporated). Buttermilk made with low-fat milk. Nonfat or low-fat  yogurt. Fats and oils Non-hydrogenated (trans-free) margarines. Vegetable oils, including soybean, sesame, sunflower, olive, peanut, safflower, corn, canola, and cottonseed. Salad dressings or mayonnaise made with a vegetable oil. Beverages Water (mineral or sparkling). Coffee and tea. Diet carbonated beverages. Sweets and desserts Sherbet, gelatin, and fruit ice. Small amounts of dark chocolate. Limit all sweets and desserts. Seasonings and condiments All seasonings and condiments. The items listed above may not be a complete list of foods and beverages you can eat. Contact a dietitian for more options. What foods are not recommended? Fruits Canned fruit in heavy syrup. Fruit in cream or butter sauce. Fried fruit. Limit coconut. Vegetables Vegetables cooked in cheese, cream, or butter sauce. Fried vegetables. Grains Breads made with saturated or trans fats, oils, or whole milk. Croissants. Sweet rolls. Donuts. High-fat crackers, such as cheese crackers. Meats and other proteins Fatty meats, such as hot dogs, ribs, sausage, bacon, rib-eye roast or steak. High-fat deli meats, such as salami and bologna. Caviar. Domestic duck and goose. Organ meats, such as liver. Dairy Cream, sour cream, cream cheese, and creamed cottage cheese. Whole-milk cheeses. Whole or 2% milk (liquid, evaporated, or condensed). Whole buttermilk. Cream sauce or high-fat cheese sauce. Whole-milk yogurt. Fats and oils Meat fat, or shortening. Cocoa butter, hydrogenated oils, palm oil, coconut oil, palm kernel oil. Solid fats and shortenings, including bacon fat, salt pork, lard, and butter. Nondairy cream substitutes. Salad dressings with cheese or sour cream. Beverages Regular sodas and any drinks with added sugar. Sweets and desserts Frosting. Pudding. Cookies. Cakes. Pies. Milk chocolate or white chocolate. Buttered syrups. Full-fat ice cream or ice cream drinks. The items listed above may not be a complete list of  foods and beverages to avoid. Contact a dietitian for more information. Summary Heart-healthy meal planning includes limiting unhealthy fats, increasing healthy fats, and making other diet and lifestyle changes. Lose weight if you are overweight. Losing just 5-10% of your body weight can help your overall health and prevent diseases such as diabetes and heart disease. Focus on eating a balance of foods, including fruits and vegetables, low-fat or nonfat dairy, lean protein, nuts and legumes, whole grains, and heart-healthy oils and fats. This information is not intended to replace advice given to you by your health care provider. Make sure you discuss any questions you have with your health care provider. Document Revised: 09/29/2020 Document Reviewed: 09/29/2020 Elsevier Patient Education  2022 Elsevier Inc.  

## 2021-09-14 NOTE — Assessment & Plan Note (Signed)
Doing well off meds ?We will monitor ?

## 2021-09-14 NOTE — Assessment & Plan Note (Signed)
Encourage diet and exercise for weight loss 

## 2021-09-14 NOTE — Assessment & Plan Note (Signed)
A1c today ?We will check urine microalbumin at next visit ?Encouraged her to consume a low-carb diet and exercise for weight loss ?Encourage routine eye exam ?Encourage routine foot exam ?Flu and Pneumovax UTD ?Encouraged her to get her COVID booster ?

## 2021-09-14 NOTE — Progress Notes (Signed)
? ?Subjective:  ? ? Patient ID: Hannah Giles, female    DOB: November 27, 1960, 61 y.o.   MRN: [id number] ? ?HPI ? ?Patient presents to clinic today for 62-monthfollow-up of chronic conditions. ? ?HTN: Her BP today is 122/74.  She is taking Lisinopril and Atenolol-Chlorthalidone as prescribed.  There is no ECG on file. ? ?HLD: Her last LDL was 115, triglycerides 94, 03/2021.  She denies myalgias on Rosuvastatin.  She tries to consume a low-fat diet. ? ?DM2: Her last A1c was 5.1%, 03/2021.  She is not taking any oral diabetic medication at this time.  She does not check her sugars.  She checks her feet routinely.  Her last eye exam was 03/2021.  Flu 02/2021.  Pneumovax 02/2020.  CBrownsdalex2. ? ?Anxiety and Depression: Currently not an issue off medications.  She is not currently seeing a therapist.  She denies SI/HI. ? ?Osteopenia: Bone density from 09/2020 reviewed.  She is taking Calcium and Vitamin D OTC.  She is getting weightbearing exercise daily. ? ?Mixed Incontinence: She is currently not taking any medications for this.  She is status post MJosph Machoprocedure.  She does not follow with urology. ? ?Lichen sclerosus: Managed with Clobetasol.  She follows with dermatology. ? ?Review of Systems ? ?   ?Past Medical History:  ?Diagnosis Date  ? Hyperlipidemia   ? Hypertension   ? Urinary incontinence   ? ? ?Current Outpatient Medications  ?Medication Sig Dispense Refill  ? atenolol-chlorthalidone (TENORETIC) 50-25 MG tablet TAKE 1 TABLET BY MOUTH DAILY. 90 tablet 1  ? Biotin 10000 MCG TABS     ? Calcium Carb-Cholecalciferol 500-400 MG-UNIT TABS     ? Cholecalciferol (VITAMIN D) 50 MCG (2000 UT) CAPS     ? clobetasol cream (TEMOVATE) 0.05 % Apply twice daily to affected areas on arms and groin until resolved, then discontinue 60 g 1  ? clobetasol ointment (TEMOVATE) 09.93% Apply 1 application topically 2 (two) times daily. 60 g 1  ? glucose blood (FREESTYLE LITE) test strip 1 each by Other route 2 (two) times daily. 100  each 2  ? Lancets (FREESTYLE) lancets 1 each by Other route 2 (two) times daily as needed for other. 100 each 2  ? lisinopril (ZESTRIL) 5 MG tablet TAKE 1 TABLET (5 MG TOTAL) BY MOUTH DAILY. 90 tablet 1  ? nitrofurantoin, macrocrystal-monohydrate, (MACROBID) 100 MG capsule Take 1 capsule (100 mg total) by mouth 2 (two) times daily. (Patient not taking: Reported on 05/18/2021) 10 capsule 0  ? polyethylene glycol (MIRALAX / GLYCOLAX) packet Take 17 g by mouth daily.    ? polyethylene glycol-electrolytes (TRILYTE) 420 g solution Use as directed 4000 mL 0  ? rosuvastatin (CRESTOR) 5 MG tablet TAKE 1 TABLET (5 MG TOTAL) BY MOUTH DAILY. 90 tablet 1  ? ?No current facility-administered medications for this visit.  ? ? ?Allergies  ?Allergen Reactions  ? Niacin   ?  REACTION: flushing, SOB face numb  ? ? ?Family History  ?Problem Relation Age of Onset  ? Cancer Mother   ?     colon  ? Colon cancer Mother   ? Heart disease Father   ?     s/p stents in the 60's  ? Dementia Father   ? Diabetes Paternal Uncle   ? Hypertension Sister   ? Pancreatic cancer Paternal Grandfather   ? Rectal cancer Neg Hx   ? Stomach cancer Neg Hx   ? ? ?Social History  ? ?  Socioeconomic History  ? Marital status: Married  ?  Spouse name: Not on file  ? Number of children: Not on file  ? Years of education: Not on file  ? Highest education level: Not on file  ?Occupational History  ? Occupation: Therapist, sports, [employer]  ?Tobacco Use  ? Smoking status: Former  ?  Packs/day: 0.50  ?  Years: 13.00  ?  Pack years: 6.50  ?  Types: Cigarettes  ?  Quit date: 06/04/1989  ?  Years since quitting: 32.3  ? Smokeless tobacco: Never  ?Vaping Use  ? Vaping Use: Never used  ?Substance and Sexual Activity  ? Alcohol use: Yes  ?  Alcohol/week: 3.0 - 4.0 standard drinks  ?  Types: 3 - 4 Glasses of wine per week  ?  Comment: socially  ? Drug use: No  ? Sexual activity: Not on file  ?Other Topics Concern  ? Not on file  ?Social History Narrative  ? Not on file  ? ?Social  Determinants of Health  ? ?Financial Resource Strain: Not on file  ?Food Insecurity: Not on file  ?Transportation Needs: Not on file  ?Physical Activity: Not on file  ?Stress: Not on file  ?Social Connections: Not on file  ?Intimate Partner Violence: Not on file  ? ? ? ?Constitutional: Denies fever, malaise, fatigue, headache or abrupt weight changes.  ?HEENT: Denies eye pain, eye redness, ear pain, ringing in the ears, wax buildup, runny nose, nasal congestion, bloody nose, or sore throat. ?Respiratory: Denies difficulty breathing, shortness of breath, cough or sputum production.   ?Cardiovascular: Denies chest pain, chest tightness, palpitations or swelling in the hands or feet.  ?Gastrointestinal: Denies abdominal pain, bloating, constipation, diarrhea or blood in the stool.  ?GU: Patient reports mixed incontinence.  Denies frequency, pain with urination, burning sensation, blood in urine, odor or discharge. ?Musculoskeletal: Denies decrease in range of motion, difficulty with gait, muscle pain or joint pain and swelling.  ?Skin: Denies redness, rashes, lesions or ulcercations.  ?Neurological: Denies dizziness, difficulty with memory, difficulty with speech or problems with balance and coordination.  ?Psych: Patient has a history of anxiety and depression.  Denies SI/HI. ? ?No other specific complaints in a complete review of systems (except as listed in HPI above). ? ?Objective:  ? Physical Exam ? ? ?BP 122/74 (BP Location: Right Arm, Patient Position: Sitting, Cuff Size: Large)   Pulse 60   Temp (!) 97.1 ?F (36.2 ?C) (Temporal)   Wt 189 lb (85.7 kg)   SpO2 100%   BMI 27.91 kg/m?  ? ?Wt Readings from Last 3 Encounters:  ?05/18/21 193 lb 1 oz (87.6 kg)  ?03/16/21 194 lb 3.2 oz (88.1 kg)  ?08/25/20 191 lb (86.6 kg)  ? ? ?General: Appears her stated age, overweight in NAD. ?Skin: Warm, dry and intact. No  ulcerations noted. ?HEENT: Head: normal shape and size; Eyes: sclera white and EOMs intact;   ?Cardiovascular: Normal rate and rhythm. S1,S2 noted.  No murmur, rubs or gallops noted. No JVD or BLE edema. No carotid bruits noted. ?Pulmonary/Chest: Normal effort and positive vesicular breath sounds. No respiratory distress. No wheezes, rales or ronchi noted.  ?Musculoskeletal: No difficulty with gait.  ?Neurological: Alert and oriented.  ?Psychiatric: Mood and affect normal. Behavior is normal. Judgment and thought content normal.  ? ?BMET ?   ?Component Value Date/Time  ? NA 142 03/16/2021 0935  ? NA 138 09/01/2013 0017  ? K 4.0 03/16/2021 0935  ? K 3.0 (L)  09/01/2013 0017  ? CL 100 03/16/2021 0935  ? CL 100 09/01/2013 0017  ? CO2 32 03/16/2021 0935  ? CO2 33 (H) 09/01/2013 0017  ? GLUCOSE 95 03/16/2021 0935  ? GLUCOSE 208 (H) 09/01/2013 0017  ? BUN 14 03/16/2021 0935  ? BUN 18 09/01/2013 0017  ? CREATININE 0.52 03/16/2021 0935  ? CALCIUM 9.8 03/16/2021 0935  ? CALCIUM 9.3 09/01/2013 0017  ? GFRNONAA >60 09/01/2013 0017  ? GFRAA >60 09/01/2013 0017  ? ? ?Lipid Panel  ?   ?Component Value Date/Time  ? CHOL 202 (H) 03/16/2021 0935  ? TRIG 94 03/16/2021 0935  ? HDL 67 03/16/2021 0935  ? CHOLHDL 3.0 03/16/2021 0935  ? VLDL 21.6 08/25/2020 0928  ? [place] 115 (H) 03/16/2021 0935  ? ? ?CBC ?   ?Component Value Date/Time  ? WBC 8.8 03/16/2021 0935  ? RBC 4.54 03/16/2021 0935  ? HGB 14.2 03/16/2021 0935  ? HGB 13.8 09/01/2013 0017  ? HCT 42.5 03/16/2021 0935  ? HCT 40.9 09/01/2013 0017  ? PLT 193 03/16/2021 0935  ? PLT 216 09/01/2013 0017  ? MCV 93.6 03/16/2021 0935  ? MCV 85 09/01/2013 0017  ? MCH 31.3 03/16/2021 0935  ? MCHC 33.4 03/16/2021 0935  ? RDW 12.3 03/16/2021 0935  ? RDW 13.9 09/01/2013 0017  ? LYMPHSABS 2.3 04/01/2015 0738  ? MONOABS 0.3 04/01/2015 0738  ? EOSABS 0.1 04/01/2015 0738  ? BASOSABS 0.0 04/01/2015 0738  ? ? ?Hgb A1C ?Lab Results  ?Component Value Date  ? HGBA1C 5.1 03/16/2021  ? ? ? ? ? ? ?   ?Assessment & Plan:  ? ? ? ?Webb Silversmith, NP ? ?

## 2021-09-14 NOTE — Assessment & Plan Note (Signed)
Controlled on Lisinopril, Atenolol-Chlorthalidone, refilled today ?Reinforced DASH diet and exercise for weight loss ?C-Met today ?

## 2021-09-14 NOTE — Assessment & Plan Note (Signed)
Follows with dermatology 

## 2021-09-14 NOTE — Assessment & Plan Note (Signed)
C-Met and lipid profile today ?Encouraged her to consume a low-fat diet ?Continue Rosuvastatin, refilled today ?

## 2021-09-14 NOTE — Assessment & Plan Note (Signed)
She will do home pelvic floor exercises ?

## 2021-09-15 LAB — COMPLETE METABOLIC PANEL WITH GFR
AG Ratio: 1.6 (calc) (ref 1.0–2.5)
ALT: 14 U/L (ref 6–29)
AST: 16 U/L (ref 10–35)
Albumin: 4.4 g/dL (ref 3.6–5.1)
Alkaline phosphatase (APISO): 72 U/L (ref 37–153)
BUN: 18 mg/dL (ref 7–25)
CO2: 32 mmol/L (ref 20–32)
Calcium: 10.2 mg/dL (ref 8.6–10.4)
Chloride: 99 mmol/L (ref 98–110)
Creat: 0.55 mg/dL (ref 0.50–1.05)
Globulin: 2.7 g/dL (calc) (ref 1.9–3.7)
Glucose, Bld: 88 mg/dL (ref 65–99)
Potassium: 3.8 mmol/L (ref 3.5–5.3)
Sodium: 140 mmol/L (ref 135–146)
Total Bilirubin: 0.8 mg/dL (ref 0.2–1.2)
Total Protein: 7.1 g/dL (ref 6.1–8.1)
eGFR: 104 mL/min/{1.73_m2} (ref >=60)

## 2021-09-15 LAB — LIPID PANEL
Cholesterol: 167 mg/dL (ref <200)
HDL: 65 mg/dL (ref >=50)
LDL Cholesterol (Calc): 81 mg/dL (calc)
Non-HDL Cholesterol (Calc): 102 mg/dL (calc) (ref <130)
Total CHOL/HDL Ratio: 2.6 (calc) (ref <5.0)
Triglycerides: 109 mg/dL (ref <150)

## 2021-09-15 LAB — HEMOGLOBIN A1C
Hgb A1c MFr Bld: 5 % of total Hgb (ref <5.7)
Mean Plasma Glucose: 97 mg/dL
eAG (mmol/L): 5.4 mmol/L

## 2021-11-13 ENCOUNTER — Other Ambulatory Visit: Payer: Self-pay

## 2021-11-14 ENCOUNTER — Other Ambulatory Visit: Payer: Self-pay

## 2021-11-16 ENCOUNTER — Other Ambulatory Visit: Payer: Self-pay

## 2021-12-13 ENCOUNTER — Other Ambulatory Visit: Payer: Self-pay

## 2021-12-15 ENCOUNTER — Other Ambulatory Visit (HOSPITAL_COMMUNITY): Payer: Self-pay

## 2022-02-14 ENCOUNTER — Encounter: Payer: Self-pay | Admitting: Internal Medicine

## 2022-02-14 DIAGNOSIS — E119 Type 2 diabetes mellitus without complications: Secondary | ICD-10-CM

## 2022-03-07 ENCOUNTER — Other Ambulatory Visit: Payer: Self-pay

## 2022-03-07 DIAGNOSIS — E119 Type 2 diabetes mellitus without complications: Secondary | ICD-10-CM

## 2022-03-08 ENCOUNTER — Other Ambulatory Visit: Payer: 59

## 2022-03-08 DIAGNOSIS — E119 Type 2 diabetes mellitus without complications: Secondary | ICD-10-CM | POA: Diagnosis not present

## 2022-03-09 LAB — CBC
HCT: 41.7 % (ref 35.0–45.0)
Hemoglobin: 14.3 g/dL (ref 11.7–15.5)
MCH: 31.5 pg (ref 27.0–33.0)
MCHC: 34.3 g/dL (ref 32.0–36.0)
MCV: 91.9 fL (ref 80.0–100.0)
MPV: 9.7 fL (ref 7.5–12.5)
Platelets: 189 10*3/uL (ref 140–400)
RBC: 4.54 10*6/uL (ref 3.80–5.10)
RDW: 12.3 % (ref 11.0–15.0)
WBC: 9.6 10*3/uL (ref 3.8–10.8)

## 2022-03-09 LAB — COMPLETE METABOLIC PANEL WITH GFR
AG Ratio: 1.8 (calc) (ref 1.0–2.5)
ALT: 12 U/L (ref 6–29)
AST: 14 U/L (ref 10–35)
Albumin: 4.6 g/dL (ref 3.6–5.1)
Alkaline phosphatase (APISO): 71 U/L (ref 37–153)
BUN: 15 mg/dL (ref 7–25)
CO2: 32 mmol/L (ref 20–32)
Calcium: 9.9 mg/dL (ref 8.6–10.4)
Chloride: 97 mmol/L — ABNORMAL LOW (ref 98–110)
Creat: 0.63 mg/dL (ref 0.50–1.05)
Globulin: 2.5 g/dL (calc) (ref 1.9–3.7)
Glucose, Bld: 91 mg/dL (ref 65–99)
Potassium: 4.6 mmol/L (ref 3.5–5.3)
Sodium: 138 mmol/L (ref 135–146)
Total Bilirubin: 0.5 mg/dL (ref 0.2–1.2)
Total Protein: 7.1 g/dL (ref 6.1–8.1)
eGFR: 100 mL/min/{1.73_m2} (ref >=60)

## 2022-03-09 LAB — LIPID PANEL
Cholesterol: 178 mg/dL (ref <200)
HDL: 60 mg/dL (ref >=50)
LDL Cholesterol (Calc): 95 mg/dL (calc)
Non-HDL Cholesterol (Calc): 118 mg/dL (calc) (ref <130)
Total CHOL/HDL Ratio: 3 (calc) (ref <5.0)
Triglycerides: 126 mg/dL (ref <150)

## 2022-03-09 LAB — HEMOGLOBIN A1C
Hgb A1c MFr Bld: 5.1 % of total Hgb (ref <5.7)
Mean Plasma Glucose: 100 mg/dL
eAG (mmol/L): 5.5 mmol/L

## 2022-03-09 LAB — MICROALBUMIN / CREATININE URINE RATIO
Creatinine, Urine: 31 mg/dL (ref 20–275)
Microalb, Ur: <0.2 mg/dL

## 2022-03-19 ENCOUNTER — Other Ambulatory Visit: Payer: Self-pay | Admitting: Internal Medicine

## 2022-03-20 ENCOUNTER — Other Ambulatory Visit: Payer: Self-pay

## 2022-03-20 MED ORDER — LISINOPRIL 5 MG PO TABS
ORAL_TABLET | Freq: Every day | ORAL | 1 refills | Status: DC
  Filled 2022-03-20: qty 90, 90d supply, fill #0
  Filled 2022-06-11: qty 90, 90d supply, fill #1

## 2022-03-20 MED ORDER — ATENOLOL-CHLORTHALIDONE 50-25 MG PO TABS
1.0000 | ORAL_TABLET | Freq: Every day | ORAL | 1 refills | Status: DC
  Filled 2022-03-20: qty 90, 90d supply, fill #0
  Filled 2022-06-11: qty 90, 90d supply, fill #1

## 2022-03-20 MED ORDER — ROSUVASTATIN CALCIUM 5 MG PO TABS
ORAL_TABLET | Freq: Every day | ORAL | 1 refills | Status: DC
  Filled 2022-03-20: qty 90, 90d supply, fill #0
  Filled 2022-06-11: qty 90, 90d supply, fill #1

## 2022-03-20 NOTE — Telephone Encounter (Signed)
Requested medication (s) are due for refill today: no  Requested medication (s) are on the active medication list: yes    Last refill: All meds refilled 09/14/21   6 month supply  Future visit scheduled yes  03/22/22  Notes to clinic: Refills available. Pt has appt 03/22/22   Please review  Requested Prescriptions  Pending Prescriptions Disp Refills   rosuvastatin (CRESTOR) 5 MG tablet 90 tablet 1    Sig: TAKE 1 TABLET (5 MG TOTAL) BY MOUTH DAILY.     Cardiovascular:  Antilipid - Statins 2 Failed - 03/19/2022 11:42 PM      Failed - Lipid Panel in normal range within the last 12 months    Cholesterol  Date Value Ref Range Status  03/08/2022 178 <200 mg/dL Final   LDL Cholesterol (Calc)  Date Value Ref Range Status  03/08/2022 95 mg/dL (calc) Final    Comment:    Reference range: <100 . Desirable range <100 mg/dL for primary prevention;   <70 mg/dL for patients with CHD or diabetic patients  with > or = 2 CHD risk factors. Marland Kitchen LDL-C is now calculated using the Martin-Hopkins  calculation, which is a validated novel method providing  better accuracy than the Friedewald equation in the  estimation of LDL-C.  Cresenciano Genre et al. Annamaria Helling. 5027;741(28): 2061-2068  (http://education.[facility].com/faq/FAQ164)    Direct LDL  Date Value Ref Range Status  06/25/2018 103.0 mg/dL Final    Comment:    Optimal:  <100 mg/dLNear or Above Optimal:  100-129 mg/dLBorderline High:  130-159 mg/dLHigh:  160-189 mg/dLVery High:  >190 mg/dL   HDL  Date Value Ref Range Status  03/08/2022 60 > OR = 50 mg/dL Final   Triglycerides  Date Value Ref Range Status  03/08/2022 126 <150 mg/dL Final         Passed - Cr in normal range and within 360 days    Creat  Date Value Ref Range Status  03/08/2022 0.63 0.50 - 1.05 mg/dL Final   Creatinine,U  Date Value Ref Range Status  05/21/2019 87.7 mg/dL Final   Creatinine, Urine  Date Value Ref Range Status  03/08/2022 31 20 - 275 mg/dL Final          Passed - Patient is not pregnant      Passed - Valid encounter within last 12 months    Recent Outpatient Visits           6 months ago Adjustment disorder with mixed anxiety and depressed mood   [facility] [place], Coralie Keens, NP   10 months ago Recurrent UTI (urinary tract infection)   [facility], [facility]   1 year ago Encounter for general adult medical examination with abnormal findings   [facility] [place], Coralie Keens, NP       Future Appointments             In 2 days Jearld Fenton, NP [facility]             lisinopril (ZESTRIL) 5 MG tablet 90 tablet 1    Sig: TAKE 1 TABLET (5 MG TOTAL) BY MOUTH DAILY.     Cardiovascular:  ACE Inhibitors Failed - 03/19/2022 11:42 PM      Failed - Valid encounter within last 6 months    Recent Outpatient Visits           6 months ago Adjustment disorder with mixed anxiety and depressed  mood   [facility] [place], [place]   10 months ago Recurrent UTI (urinary tract infection)   [facility] [place], Coralie Keens, NP   1 year ago Encounter for general adult medical examination with abnormal findings   [facility] [place], Coralie Keens, NP       Future Appointments             In 2 days Garnette Gunner, Coralie Keens, NP [facility] [place] in normal range and within 180 days    Creat  Date Value Ref Range Status  03/08/2022 0.63 0.50 - 1.05 mg/dL Final   Creatinine,U  Date Value Ref Range Status  05/21/2019 87.7 mg/dL Final   Creatinine, Urine  Date Value Ref Range Status  03/08/2022 31 20 - 275 mg/dL Final         Passed - K in normal range and within 180 days    Potassium  Date Value Ref Range Status  03/08/2022 4.6 3.5 - 5.3 mmol/L Final  09/01/2013 3.0 (L) 3.5 - 5.1 mmol/L Final         Passed - Patient is not pregnant      Passed - Last BP in normal range     BP Readings from Last 1 Encounters:  09/14/21 122/74          atenolol-chlorthalidone (TENORETIC) 50-25 MG tablet 90 tablet 1    Sig: TAKE 1 TABLET BY MOUTH DAILY.     Cardiovascular: Beta Blocker + Diuretic Combos Failed - 03/19/2022 11:42 PM      Failed - Valid encounter within last 6 months    Recent Outpatient Visits           6 months ago Adjustment disorder with mixed anxiety and depressed mood   [facility] [place], Coralie Keens, NP   10 months ago Recurrent UTI (urinary tract infection)   [facility], Coralie Keens, NP   1 year ago Encounter for general adult medical examination with abnormal findings   [facility], Coralie Keens, NP       Future Appointments             In 2 days Jearld Fenton, NP [facility] [place] in normal range and within 180 days    Potassium  Date Value Ref Range Status  03/08/2022 4.6 3.5 - 5.3 mmol/L Final  09/01/2013 3.0 (L) 3.5 - 5.1 mmol/L Final         Passed - Na in normal range and within 180 days    Sodium  Date Value Ref Range Status  03/08/2022 138 135 - 146 mmol/L Final  09/01/2013 138 136 - 145 mmol/L Final         Passed - Cr in normal range and within 180 days    Creat  Date Value Ref Range Status  03/08/2022 0.63 0.50 - 1.05 mg/dL Final   Creatinine,U  Date Value Ref Range Status  05/21/2019 87.7 mg/dL Final   Creatinine, Urine  Date Value Ref Range Status  03/08/2022 31 20 - 275 mg/dL Final         Passed - eGFR in normal range and within 180 days    EGFR (African American)  Date Value Ref Range Status  09/01/2013 >60  Final   EGFR (Non-African Amer.)  Date Value Ref Range Status  09/01/2013 >60  Final    Comment:    eGFR values <75m/min/1.73 m2 may be an indication of chronic kidney disease (CKD). Calculated eGFR is useful in patients with stable renal function. The eGFR calculation will not be reliable in acutely  ill patients when serum creatinine is changing rapidly. It is not useful in  patients on dialysis. The eGFR calculation may not be applicable to patients at the low and high extremes of body sizes, pregnant women, and vegetarians.    GFR  Date Value Ref Range Status  08/25/2020 98.04 >60.00 mL/min Final    Comment:    Calculated using the CKD-EPI Creatinine Equation (2021)   eGFR  Date Value Ref Range Status  03/08/2022 100 > OR = 60 mL/min/1.734mFinal         Passed - Last BP in normal range    BP Readings from Last 1 Encounters:  09/14/21 122/74         Passed - Last Heart Rate in normal range    Pulse Readings from Last 1 Encounters:  09/14/21 60

## 2022-03-22 ENCOUNTER — Encounter: Payer: Self-pay | Admitting: Internal Medicine

## 2022-03-22 ENCOUNTER — Ambulatory Visit (INDEPENDENT_AMBULATORY_CARE_PROVIDER_SITE_OTHER): Payer: 59 | Admitting: Internal Medicine

## 2022-03-22 ENCOUNTER — Other Ambulatory Visit: Payer: Self-pay

## 2022-03-22 VITALS — BP 136/80 | HR 63 | Temp 96.2°F | Ht 69.0 in | Wt 193.0 lb

## 2022-03-22 DIAGNOSIS — Z6828 Body mass index (BMI) 28.0-28.9, adult: Secondary | ICD-10-CM

## 2022-03-22 DIAGNOSIS — Z0001 Encounter for general adult medical examination with abnormal findings: Secondary | ICD-10-CM | POA: Diagnosis not present

## 2022-03-22 DIAGNOSIS — L989 Disorder of the skin and subcutaneous tissue, unspecified: Secondary | ICD-10-CM | POA: Diagnosis not present

## 2022-03-22 DIAGNOSIS — E663 Overweight: Secondary | ICD-10-CM | POA: Diagnosis not present

## 2022-03-22 DIAGNOSIS — L900 Lichen sclerosus et atrophicus: Secondary | ICD-10-CM | POA: Diagnosis not present

## 2022-03-22 MED ORDER — CLOBETASOL PROPIONATE 0.05 % EX CREA
TOPICAL_CREAM | CUTANEOUS | 1 refills | Status: DC
  Filled 2022-03-22 – 2022-04-17 (×2): qty 60, 30d supply, fill #0

## 2022-03-22 MED ORDER — CLOBETASOL PROPIONATE 0.05 % EX OINT
1.0000 | TOPICAL_OINTMENT | Freq: Two times a day (BID) | CUTANEOUS | 1 refills | Status: DC
  Filled 2022-03-22: qty 60, 30d supply, fill #0
  Filled 2023-01-24: qty 60, 30d supply, fill #1

## 2022-03-22 NOTE — Progress Notes (Signed)
Subjective:    Patient ID: Hannah Giles, female    DOB: [birth year], 61 y.o.   MRN: [id number]  HPI  Patient presents to clinic today for her annual exam.  Flu: 02/2022 Tetanus: 03/2019 COVID: Pfizer x4 Pneumovax: 02/2020 Shingrix: 08/2020, 11/2020 Pap smear: 2023, [facility] Mammogram: 2023, [facility] Bone density: 09/2020 Colon screening: 08/2021 Vision screening: annually Dentist: biannually  Diet: She does eat meat. She consumes fruits and veggies. She rarely eats fried foods. She drinks mostly water. Exercise: None  Review of Systems   Past Medical History:  Diagnosis Date   Hyperlipidemia    Hypertension    Urinary incontinence     Current Outpatient Medications  Medication Sig Dispense Refill   atenolol-chlorthalidone (TENORETIC) 50-25 MG tablet TAKE 1 TABLET BY MOUTH DAILY. 90 tablet 1   Biotin 10000 MCG TABS      Calcium Carb-Cholecalciferol 500-400 MG-UNIT TABS      Cholecalciferol (VITAMIN D) 50 MCG (2000 UT) CAPS      clobetasol cream (TEMOVATE) 0.05 % Apply twice daily to affected areas on arms and groin until resolved, then discontinue 60 g 1   clobetasol ointment (TEMOVATE) 4.33 % Apply 1 application topically 2 (two) times daily. 60 g 1   lisinopril (ZESTRIL) 5 MG tablet TAKE 1 TABLET (5 MG TOTAL) BY MOUTH DAILY. 90 tablet 1   polyethylene glycol (MIRALAX / GLYCOLAX) packet Take 17 g by mouth daily.     rosuvastatin (CRESTOR) 5 MG tablet TAKE 1 TABLET (5 MG TOTAL) BY MOUTH DAILY. 90 tablet 1   No current facility-administered medications for this visit.    Allergies  Allergen Reactions   Niacin     REACTION: flushing, SOB face numb    Family History  Problem Relation Age of Onset   Cancer Mother        colon   Colon cancer Mother    Heart disease Father        s/p stents in the 24's   Dementia Father    Diabetes Paternal Uncle    Hypertension Sister    Pancreatic cancer Paternal Grandfather    Rectal cancer Neg Hx     Stomach cancer Neg Hx     Social History   Socioeconomic History   Marital status: Married    Spouse name: Not on file   Number of children: Not on file   Years of education: Not on file   Highest education level: Not on file  Occupational History   Occupation: Therapist, sports, Theme park manager  Tobacco Use   Smoking status: Former    Packs/day: 0.50    Years: 13.00    Total pack years: 6.50    Types: Cigarettes    Quit date: 06/04/1989    Years since quitting: 32.8   Smokeless tobacco: Never  Vaping Use   Vaping Use: Never used  Substance and Sexual Activity   Alcohol use: Yes    Alcohol/week: 3.0 - 4.0 standard drinks of alcohol    Types: 3 - 4 Glasses of wine per week    Comment: socially   Drug use: No   Sexual activity: Not on file  Other Topics Concern   Not on file  Social History Narrative   Not on file   Social Determinants of Health   Financial Resource Strain: Not on file  Food Insecurity: Not on file  Transportation Needs: Not on file  Physical Activity: Not on file  Stress: Not on file  Social Connections: Not on file  Intimate Partner Violence: Not on file     Constitutional: Denies fever, malaise, fatigue, headache or abrupt weight changes.  HEENT: Denies eye pain, eye redness, ear pain, ringing in the ears, wax buildup, runny nose, nasal congestion, bloody nose, or sore throat. Respiratory: Denies difficulty breathing, shortness of breath, cough or sputum production.   Cardiovascular: Denies chest pain, chest tightness, palpitations or swelling in the hands or feet.  Gastrointestinal: Denies abdominal pain, bloating, constipation, diarrhea or blood in the stool.  GU: Pt reports stress incontinence. Denies urgency, frequency, pain with urination, burning sensation, blood in urine, odor or discharge. Musculoskeletal: Denies decrease in range of motion, difficulty with gait, muscle pain or joint pain and swelling.  Skin: Pt reports rash of groin and thighs, skin  lesion of left upper thigh. Denies redness, rashes, or ulcercations.  Neurological: Denies dizziness, difficulty with memory, difficulty with speech or problems with balance and coordination.  Psych: Pt reports anxiety and depression. Denies SI/HI.  No other specific complaints in a complete review of systems (except as listed in HPI above).   Objective:   Physical Exam  BP 136/80 (BP Location: Right Arm, Patient Position: Sitting, Cuff Size: Normal)   Pulse 63   Temp (!) 96.2 F (35.7 C) (Tympanic)   Ht '5\' 9"'$  (1.753 m)   Wt 193 lb (87.5 kg)   SpO2 97%   BMI 28.50 kg/m   Wt Readings from Last 3 Encounters:  09/14/21 189 lb (85.7 kg)  05/18/21 193 lb 1 oz (87.6 kg)  03/16/21 194 lb 3.2 oz (88.1 kg)    General: Appears her stated age, overweight, in NAD. Skin: Warm, dry and intact. 1 cm raised lesion on erythematous based noted of upper left thigh. HEENT: Head: normal shape and size; Eyes: sclera white, no icterus, conjunctiva pink, PERRLA and EOMs intact;  Neck:  Neck supple, trachea midline. No masses, lumps or thyromegaly present.  Cardiovascular: Normal rate and rhythm. S1,S2 noted.  No murmur, rubs or gallops noted. No JVD or BLE edema. No carotid bruits noted. Pulmonary/Chest: Normal effort and positive vesicular breath sounds. No respiratory distress. No wheezes, rales or ronchi noted.  Abdomen: Normal bowel sounds.  Musculoskeletal: Strength 5/5 BUE/BLE. No difficulty with gait.  Neurological: Alert and oriented. Cranial nerves II-XII grossly intact. Coordination normal.  Psychiatric: Mood and affect normal. Behavior is normal. Judgment and thought content normal.    BMET    Component Value Date/Time   NA 138 03/08/2022 0905   NA 138 09/01/2013 0017   K 4.6 03/08/2022 0905   K 3.0 (L) 09/01/2013 0017   CL 97 (L) 03/08/2022 0905   CL 100 09/01/2013 0017   CO2 32 03/08/2022 0905   CO2 33 (H) 09/01/2013 0017   GLUCOSE 91 03/08/2022 0905   GLUCOSE 208 (H)  09/01/2013 0017   BUN 15 03/08/2022 0905   BUN 18 09/01/2013 0017   CREATININE 0.63 03/08/2022 0905   CALCIUM 9.9 03/08/2022 0905   CALCIUM 9.3 09/01/2013 0017   GFRNONAA >60 09/01/2013 0017   GFRAA >60 09/01/2013 0017    Lipid Panel     Component Value Date/Time   CHOL 178 03/08/2022 0905   TRIG 126 03/08/2022 0905   HDL 60 03/08/2022 0905   CHOLHDL 3.0 03/08/2022 0905   VLDL 21.6 08/25/2020 0928   LDLCALC 95 03/08/2022 0905    CBC    Component Value Date/Time   WBC 9.6 03/08/2022 0905   RBC 4.54  03/08/2022 0905   HGB 14.3 03/08/2022 0905   HGB 13.8 09/01/2013 0017   HCT 41.7 03/08/2022 0905   HCT 40.9 09/01/2013 0017   PLT 189 03/08/2022 0905   PLT 216 09/01/2013 0017   MCV 91.9 03/08/2022 0905   MCV 85 09/01/2013 0017   MCH 31.5 03/08/2022 0905   MCHC 34.3 03/08/2022 0905   RDW 12.3 03/08/2022 0905   RDW 13.9 09/01/2013 0017   LYMPHSABS 2.3 04/01/2015 0738   MONOABS 0.3 04/01/2015 0738   EOSABS 0.1 04/01/2015 0738   BASOSABS 0.0 04/01/2015 0738    Hgb A1C Lab Results  Component Value Date   HGBA1C 5.1 03/08/2022           Assessment & Plan:   Preventative Health Maintenance:  Flu shot UTD Tetanus UTD Encouraged her to get her COVID booster Pneumovax UTD Shingrix UTD Pap smear UTD, will request copy Mammogram UTD, will request copy Bone density UTD Colon screening UTD Encouraged her to consume a balanced diet and exercise regimen Advised her to see an eye doctor and dentist annually Recent labs reviewed  Skin Lesion Left Thigh:  Concerning for skin cancer She already has an appointment dermatology scheduled in 1 month  RTC in 6 months for follow-up chronic conditions Webb Silversmith, NP

## 2022-03-22 NOTE — Patient Instructions (Signed)
Health Maintenance for Postmenopausal Women Menopause is a normal process in which your ability to get pregnant comes to an end. This process happens slowly over many months or years, usually between the ages of 48 and 55. Menopause is complete when you have missed your menstrual period for 12 months. It is important to talk with your health care provider about some of the most common conditions that affect women after menopause (postmenopausal women). These include heart disease, cancer, and bone loss (osteoporosis). Adopting a healthy lifestyle and getting preventive care can help to promote your health and wellness. The actions you take can also lower your chances of developing some of these common conditions. What are the signs and symptoms of menopause? During menopause, you may have the following symptoms: Hot flashes. These can be moderate or severe. Night sweats. Decrease in sex drive. Mood swings. Headaches. Tiredness (fatigue). Irritability. Memory problems. Problems falling asleep or staying asleep. Talk with your health care provider about treatment options for your symptoms. Do I need hormone replacement therapy? Hormone replacement therapy is effective in treating symptoms that are caused by menopause, such as hot flashes and night sweats. Hormone replacement carries certain risks, especially as you become older. If you are thinking about using estrogen or estrogen with progestin, discuss the benefits and risks with your health care provider. How can I reduce my risk for heart disease and stroke? The risk of heart disease, heart attack, and stroke increases as you age. One of the causes may be a change in the body's hormones during menopause. This can affect how your body uses dietary fats, triglycerides, and cholesterol. Heart attack and stroke are medical emergencies. There are many things that you can do to help prevent heart disease and stroke. Watch your blood pressure High  blood pressure causes heart disease and increases the risk of stroke. This is more likely to develop in people who have high blood pressure readings or are overweight. Have your blood pressure checked: Every 3-5 years if you are 18-39 years of age. Every year if you are 40 years old or older. Eat a healthy diet  Eat a diet that includes plenty of vegetables, fruits, low-fat dairy products, and lean protein. Do not eat a lot of foods that are high in solid fats, added sugars, or sodium. Get regular exercise Get regular exercise. This is one of the most important things you can do for your health. Most adults should: Try to exercise for at least 150 minutes each week. The exercise should increase your heart rate and make you sweat (moderate-intensity exercise). Try to do strengthening exercises at least twice each week. Do these in addition to the moderate-intensity exercise. Spend less time sitting. Even light physical activity can be beneficial. Other tips Work with your health care provider to achieve or maintain a healthy weight. Do not use any products that contain nicotine or tobacco. These products include cigarettes, chewing tobacco, and vaping devices, such as e-cigarettes. If you need help quitting, ask your health care provider. Know your numbers. Ask your health care provider to check your cholesterol and your blood sugar (glucose). Continue to have your blood tested as directed by your health care provider. Do I need screening for cancer? Depending on your health history and family history, you may need to have cancer screenings at different stages of your life. This may include screening for: Breast cancer. Cervical cancer. Lung cancer. Colorectal cancer. What is my risk for osteoporosis? After menopause, you may be   at increased risk for osteoporosis. Osteoporosis is a condition in which bone destruction happens more quickly than new bone creation. To help prevent osteoporosis or  the bone fractures that can happen because of osteoporosis, you may take the following actions: If you are 19-50 years old, get at least 1,000 mg of calcium and at least 600 international units (IU) of vitamin D per day. If you are older than age 50 but younger than age 70, get at least 1,200 mg of calcium and at least 600 international units (IU) of vitamin D per day. If you are older than age 70, get at least 1,200 mg of calcium and at least 800 international units (IU) of vitamin D per day. Smoking and drinking excessive alcohol increase the risk of osteoporosis. Eat foods that are rich in calcium and vitamin D, and do weight-bearing exercises several times each week as directed by your health care provider. How does menopause affect my mental health? Depression may occur at any age, but it is more common as you become older. Common symptoms of depression include: Feeling depressed. Changes in sleep patterns. Changes in appetite or eating patterns. Feeling an overall lack of motivation or enjoyment of activities that you previously enjoyed. Frequent crying spells. Talk with your health care provider if you think that you are experiencing any of these symptoms. General instructions See your health care provider for regular wellness exams and vaccines. This may include: Scheduling regular health, dental, and eye exams. Getting and maintaining your vaccines. These include: Influenza vaccine. Get this vaccine each year before the flu season begins. Pneumonia vaccine. Shingles vaccine. Tetanus, diphtheria, and pertussis (Tdap) booster vaccine. Your health care provider may also recommend other immunizations. Tell your health care provider if you have ever been abused or do not feel safe at home. Summary Menopause is a normal process in which your ability to get pregnant comes to an end. This condition causes hot flashes, night sweats, decreased interest in sex, mood swings, headaches, or lack  of sleep. Treatment for this condition may include hormone replacement therapy. Take actions to keep yourself healthy, including exercising regularly, eating a healthy diet, watching your weight, and checking your blood pressure and blood sugar levels. Get screened for cancer and depression. Make sure that you are up to date with all your vaccines. This information is not intended to replace advice given to you by your health care provider. Make sure you discuss any questions you have with your health care provider. Document Revised: 10/10/2020 Document Reviewed: 10/10/2020 Elsevier Patient Education  2023 [employer]  

## 2022-03-22 NOTE — Assessment & Plan Note (Signed)
Encourage diet and exercise for weight loss 

## 2022-03-23 ENCOUNTER — Other Ambulatory Visit: Payer: Self-pay

## 2022-04-02 ENCOUNTER — Encounter: Payer: Self-pay | Admitting: Internal Medicine

## 2022-04-03 MED ORDER — HYDROCHLOROTHIAZIDE 12.5 MG PO TABS: 12.5000 mg | ORAL_TABLET | Freq: Every day | ORAL | 0 refills | Status: DC | PRN

## 2022-04-10 ENCOUNTER — Other Ambulatory Visit: Payer: Self-pay

## 2022-04-17 ENCOUNTER — Other Ambulatory Visit: Payer: Self-pay

## 2022-04-18 ENCOUNTER — Other Ambulatory Visit: Payer: Self-pay

## 2022-04-19 ENCOUNTER — Other Ambulatory Visit: Payer: Self-pay

## 2022-04-19 ENCOUNTER — Encounter: Payer: Self-pay | Admitting: Pharmacist

## 2022-04-19 DIAGNOSIS — D2261 Melanocytic nevi of right upper limb, including shoulder: Secondary | ICD-10-CM | POA: Diagnosis not present

## 2022-04-19 DIAGNOSIS — D2272 Melanocytic nevi of left lower limb, including hip: Secondary | ICD-10-CM | POA: Diagnosis not present

## 2022-04-19 DIAGNOSIS — L858 Other specified epidermal thickening: Secondary | ICD-10-CM | POA: Diagnosis not present

## 2022-04-19 DIAGNOSIS — D2271 Melanocytic nevi of right lower limb, including hip: Secondary | ICD-10-CM | POA: Diagnosis not present

## 2022-04-19 DIAGNOSIS — D2262 Melanocytic nevi of left upper limb, including shoulder: Secondary | ICD-10-CM | POA: Diagnosis not present

## 2022-04-19 DIAGNOSIS — L089 Local infection of the skin and subcutaneous tissue, unspecified: Secondary | ICD-10-CM | POA: Diagnosis not present

## 2022-04-19 DIAGNOSIS — D225 Melanocytic nevi of trunk: Secondary | ICD-10-CM | POA: Diagnosis not present

## 2022-04-19 DIAGNOSIS — L821 Other seborrheic keratosis: Secondary | ICD-10-CM | POA: Diagnosis not present

## 2022-04-19 DIAGNOSIS — D485 Neoplasm of uncertain behavior of skin: Secondary | ICD-10-CM | POA: Diagnosis not present

## 2022-04-19 MED ORDER — IVERMECTIN 1 % EX CREA
TOPICAL_CREAM | CUTANEOUS | 5 refills | Status: DC
  Filled 2022-04-19 – 2022-04-24 (×2): qty 45, 30d supply, fill #0

## 2022-04-20 ENCOUNTER — Other Ambulatory Visit: Payer: Self-pay

## 2022-04-20 MED ORDER — AZELAIC ACID 15 % EX GEL
Freq: Every day | CUTANEOUS | 0 refills | Status: AC
  Filled 2022-04-20: qty 50, 30d supply, fill #0

## 2022-04-23 ENCOUNTER — Other Ambulatory Visit: Payer: Self-pay

## 2022-04-24 ENCOUNTER — Other Ambulatory Visit: Payer: Self-pay

## 2022-04-25 ENCOUNTER — Other Ambulatory Visit: Payer: Self-pay

## 2022-04-25 ENCOUNTER — Telehealth: Payer: 59 | Admitting: Physician Assistant

## 2022-04-25 DIAGNOSIS — H109 Unspecified conjunctivitis: Secondary | ICD-10-CM

## 2022-04-25 MED ORDER — POLYMYXIN B-TRIMETHOPRIM 10000-0.1 UNIT/ML-% OP SOLN
1.0000 [drp] | OPHTHALMIC | 0 refills | Status: DC
  Filled 2022-04-25: qty 10, 30d supply, fill #0

## 2022-04-25 NOTE — Progress Notes (Signed)
Virtual Visit Consent   Hannah Giles, you are scheduled for a virtual visit with a [place] provider today. Just as with appointments in the office, your consent must be obtained to participate. Your consent will be active for this visit and any virtual visit you may have with one of our providers in the next 365 days. If you have a MyChart account, a copy of this consent can be sent to you electronically.  As this is a virtual visit, video technology does not allow for your provider to perform a traditional examination. This may limit your provider's ability to fully assess your condition. If your provider identifies any concerns that need to be evaluated in person or the need to arrange testing (such as labs, EKG, etc.), we will make arrangements to do so. Although advances in technology are sophisticated, we cannot ensure that it will always work on either your end or our end. If the connection with a video visit is poor, the visit may have to be switched to a telephone visit. With either a video or telephone visit, we are not always able to ensure that we have a secure connection.  By engaging in this virtual visit, you consent to the provision of healthcare and authorize for your insurance to be billed (if applicable) for the services provided during this visit. Depending on your insurance coverage, you may receive a charge related to this service.  I need to obtain your verbal consent now. Are you willing to proceed with your visit today? Hannah Giles has provided verbal consent on 04/25/2022 for a virtual visit (video or telephone). Hannah Daring, PA-C  Date: 04/25/2022 8:06 AM  Virtual Visit via Video Note   I, Hannah Giles, connected with  Hannah Giles  ([id number], 10-Sep-1960) on 04/25/22 at  8:00 AM EST by a video-enabled telemedicine application and verified that I am speaking with the correct person using two identifiers.  Location: Patient: Virtual Visit Location  Patient: Home Provider: Virtual Visit Location Provider: Home Office   I discussed the limitations of evaluation and management by telemedicine and the availability of in person appointments. The patient expressed understanding and agreed to proceed.    History of Present Illness: Hannah Giles is a 61 y.o. who identifies as a female who was assigned female at birth, and is being seen today for possible pink eye.  HPI: Conjunctivitis  The current episode started 63 to 5 days ago. The onset was sudden. The problem has been gradually worsening. The problem is mild. Nothing relieves the symptoms. Nothing aggravates the symptoms. Associated symptoms include eye itching, eye discharge (started with tearing then increased to purulent discharge with redness and itching), eye pain (aching) and eye redness. Pertinent negatives include no fever, no decreased vision, no double vision, no photophobia, no congestion, no headaches and no rhinorrhea. The eye pain is mild. The left eye is affected. The eye pain is not associated with movement. The eyelid exhibits no abnormality.     Problems:  Patient Active Problem List   Diagnosis Date Noted   Osteopenia 09/14/2021   Mixed incontinence 09/14/2021   Overweight with body mass index (BMI) of 28 to 28.9 in adult 87/56/4332   Lichen sclerosus 95/18/8416   DM (diabetes mellitus), type 2 ([place]) 10/27/2014   Adjustment disorder with mixed anxiety and depressed mood 10/14/2014   HLD (hyperlipidemia) 10/27/2009   Essential hypertension 10/27/2009    Allergies:  Allergies  Allergen Reactions   Niacin  REACTION: flushing, SOB face numb   Medications:  Current Outpatient Medications:    trimethoprim-polymyxin b (POLYTRIM) ophthalmic solution, Place 1 drop into the left eye every 4 (four) hours., Disp: 10 mL, Rfl: 0   atenolol-chlorthalidone (TENORETIC) 50-25 MG tablet, TAKE 1 TABLET BY MOUTH DAILY., Disp: 90 tablet, Rfl: 1   Azelaic Acid (FINACEA) 15 % gel,  Apply topically daily., Disp: 50 g, Rfl: 0   Biotin 10000 MCG TABS, , Disp: , Rfl:    Calcium Carb-Cholecalciferol 500-400 MG-UNIT TABS, , Disp: , Rfl:    Cholecalciferol (VITAMIN D) 50 MCG (2000 UT) CAPS, , Disp: , Rfl:    clobetasol cream (TEMOVATE) 0.05 %, Apply twice daily to affected areas on arms and groin until resolved, then discontinue, Disp: 60 g, Rfl: 1   clobetasol ointment (TEMOVATE) 1.51 %, Apply 1 Application topically 2 (two) times daily., Disp: 60 g, Rfl: 1   hydrochlorothiazide (HYDRODIURIL) 12.5 MG tablet, Take 1 tablet (12.5 mg total) by mouth daily as needed., Disp: 30 tablet, Rfl: 0   Ivermectin (SOOLANTRA) 1 % CREA, Apply to face every morning, Disp: 45 g, Rfl: 5   lisinopril (ZESTRIL) 5 MG tablet, TAKE 1 TABLET (5 MG TOTAL) BY MOUTH DAILY., Disp: 90 tablet, Rfl: 1   polyethylene glycol (MIRALAX / GLYCOLAX) packet, Take 17 g by mouth daily., Disp: , Rfl:    rosuvastatin (CRESTOR) 5 MG tablet, TAKE 1 TABLET (5 MG TOTAL) BY MOUTH DAILY., Disp: 90 tablet, Rfl: 1  Observations/Objective: Patient is well-developed, well-nourished in no acute distress.  Resting comfortably  at home.  Head is normocephalic, atraumatic.  No labored breathing.  Speech is clear and coherent with logical content.  Patient is alert and oriented at baseline.  Left eye is mildly swollen and conjunctiva is injected  Assessment and Plan: 1. Bacterial conjunctivitis of left eye - trimethoprim-polymyxin b (POLYTRIM) ophthalmic solution; Place 1 drop into the left eye every 4 (four) hours.  Dispense: 10 mL; Refill: 0  - Suspect bacterial conjunctivitis - Polytrim prescribed - Warm compresses - Good hand hygiene - Seek in person evaluation if symptoms worsen or fail to improve   Follow Up Instructions: I discussed the assessment and treatment plan with the patient. The patient was provided an opportunity to ask questions and all were answered. The patient agreed with the plan and demonstrated an  understanding of the instructions.  A copy of instructions were sent to the patient via MyChart unless otherwise noted below.    The patient was advised to call back or seek an in-person evaluation if the symptoms worsen or if the condition fails to improve as anticipated.  Time:  I spent 10 minutes with the patient via telehealth technology discussing the above problems/concerns.    Hannah Daring, PA-C

## 2022-04-25 NOTE — Patient Instructions (Signed)
Sharman Cheek, thank you for joining Mar Daring, PA-C for today's virtual visit.  While this provider is not your primary care provider (PCP), if your PCP is located in our provider database this encounter information will be shared with them immediately following your visit.   [facility] account gives you access to today's visit and all your visits, tests, and labs performed at [facility] " click here if you don't have a [facility] account or go to [email or web address]  Consent: (Patient) Hannah Giles provided verbal consent for this virtual visit at the beginning of the encounter.  Current Medications:  Current Outpatient Medications:    trimethoprim-polymyxin b (POLYTRIM) ophthalmic solution, Place 1 drop into the left eye every 4 (four) hours., Disp: 10 mL, Rfl: 0   atenolol-chlorthalidone (TENORETIC) 50-25 MG tablet, TAKE 1 TABLET BY MOUTH DAILY., Disp: 90 tablet, Rfl: 1   Azelaic Acid (FINACEA) 15 % gel, Apply topically daily., Disp: 50 g, Rfl: 0   Biotin 10000 MCG TABS, , Disp: , Rfl:    Calcium Carb-Cholecalciferol 500-400 MG-UNIT TABS, , Disp: , Rfl:    Cholecalciferol (VITAMIN D) 50 MCG (2000 UT) CAPS, , Disp: , Rfl:    clobetasol cream (TEMOVATE) 0.05 %, Apply twice daily to affected areas on arms and groin until resolved, then discontinue, Disp: 60 g, Rfl: 1   clobetasol ointment (TEMOVATE) 9.56 %, Apply 1 Application topically 2 (two) times daily., Disp: 60 g, Rfl: 1   hydrochlorothiazide (HYDRODIURIL) 12.5 MG tablet, Take 1 tablet (12.5 mg total) by mouth daily as needed., Disp: 30 tablet, Rfl: 0   Ivermectin (SOOLANTRA) 1 % CREA, Apply to face every morning, Disp: 45 g, Rfl: 5   lisinopril (ZESTRIL) 5 MG tablet, TAKE 1 TABLET (5 MG TOTAL) BY MOUTH DAILY., Disp: 90 tablet, Rfl: 1   polyethylene glycol (MIRALAX / GLYCOLAX) packet, Take 17 g by mouth daily., Disp: , Rfl:    rosuvastatin (CRESTOR) 5 MG tablet, TAKE 1 TABLET (5 MG TOTAL)  BY MOUTH DAILY., Disp: 90 tablet, Rfl: 1   Medications ordered in this encounter:  Meds ordered this encounter  Medications   trimethoprim-polymyxin b (POLYTRIM) ophthalmic solution    Sig: Place 1 drop into the left eye every 4 (four) hours.    Dispense:  10 mL    Refill:  0    Order Specific Question:   Supervising Provider    Answer:   Chase Picket [[id number]]     *If you need refills on other medications prior to your next appointment, please contact your pharmacy*  Follow-Up: Call back or seek an in-person evaluation if the symptoms worsen or if the condition fails to improve as anticipated.  [place] [phone]  Other Instructions  Bacterial Conjunctivitis, Adult Bacterial conjunctivitis is an infection of the clear membrane that covers the white part of the eye and the inner surface of the eyelid (conjunctiva). When the blood vessels in the conjunctiva become inflamed, the eye becomes red or pink. The eye often feels irritated or itchy. Bacterial conjunctivitis spreads easily from person to person (is contagious). It also spreads easily from one eye to the other eye. What are the causes? This condition is caused by bacteria. You may get the infection if you come into close contact with: A person who is infected with the bacteria. Items that are contaminated with the bacteria, such as a face towel, contact lens solution, or eye makeup. What increases the risk? You  are more likely to develop this condition if: You are exposed to other people who have the infection. You wear contact lenses. You have a sinus infection. You have had a recent eye injury or surgery. You have a weak body defense system (immune system). You have a medical condition that causes dry eyes. What are the signs or symptoms? Symptoms of this condition include: Thick, yellowish discharge from the eye. This may turn into a crust on the eyelid overnight and cause your eyelids to stick  together. Tearing or watery eyes. Itchy eyes. Burning feeling in your eyes. Eye redness. Swollen eyelids. Blurred vision. How is this diagnosed? This condition is diagnosed based on your symptoms and medical history. Your health care provider may also take a sample of discharge from your eye to find the cause of your infection. How is this treated? This condition may be treated with: Antibiotic eye drops or ointment to clear the infection more quickly and prevent the spread of infection to others. Antibiotic medicines taken by mouth (orally) to treat infections that do not respond to drops or ointments or that last longer than 10 days. Cool, wet cloths (cool compresses) placed on the eyes. Artificial tears applied 2-6 times a day. Follow these instructions at home: Medicines Take or apply your antibiotic medicine as told by your health care provider. Do not stop using the antibiotic, even if your condition improves, unless directed by your health care provider. Take or apply over-the-counter and prescription medicines only as told by your health care provider. Be very careful to avoid touching the edge of your eyelid with the eye-drop bottle or the ointment tube when you apply medicines to the affected eye. This will keep you from spreading the infection to your other eye or to other people. Managing discomfort Gently wipe away any drainage from your eye with a warm, wet washcloth or a cotton ball. Apply a clean, cool compress to your eye for 10-20 minutes, 3-4 times a day. General instructions Do not wear contact lenses until the inflammation is gone and your health care provider says it is safe to wear them again. Ask your health care provider how to sterilize or replace your contact lenses before you use them again. Wear glasses until you can resume wearing contact lenses. Avoid wearing eye makeup until the inflammation is gone. Throw away any old eye cosmetics that may be  contaminated. Change or wash your pillowcase every day. Do not share towels or washcloths. This may spread the infection. Wash your hands often with soap and water for at least 20 seconds and especially before touching your face or eyes. Use paper towels to dry your hands. Avoid touching or rubbing your eyes. Do not drive or use heavy machinery if your vision is blurred. Contact a health care provider if: You have a fever. Your symptoms do not get better after 10 days. Get help right away if: You have a fever and your symptoms suddenly get worse. You have severe pain when you move your eye. You have facial pain, redness, or swelling. You have a sudden loss of vision. Summary Bacterial conjunctivitis is an infection of the clear membrane that covers the white part of the eye and the inner surface of the eyelid (conjunctiva). Bacterial conjunctivitis spreads easily from eye to eye and from person to person (is contagious). Wash your hands often with soap and water for at least 20 seconds and especially before touching your face or eyes. Use paper towels to dry  your hands. Take or apply your antibiotic medicine as told by your health care provider. Do not stop using the antibiotic even if your condition improves. Contact a health care provider if you have a fever or if your symptoms do not get better after 10 days. Get help right away if you have a sudden loss of vision. This information is not intended to replace advice given to you by your health care provider. Make sure you discuss any questions you have with your health care provider. Document Revised: 08/31/2020 Document Reviewed: 08/31/2020 Elsevier Patient Education  [place].    If you have been instructed to have an in-person evaluation today at a local Urgent Care facility, please use the link below. It will take you to a list of all of our available [facility], including address, phone number and hours of  operation. Please do not delay care.  [facility]  If you or a family member do not have a primary care provider, use the link below to schedule a visit and establish care. When you choose a [facility] primary care physician or advanced practice provider, you gain a long-term partner in health. Find a Primary Care Provider  Learn more about [facility] in-office and virtual care options: [facility]

## 2022-04-30 ENCOUNTER — Other Ambulatory Visit: Payer: Self-pay | Admitting: Internal Medicine

## 2022-05-01 NOTE — Telephone Encounter (Signed)
Requested Prescriptions  Pending Prescriptions Disp Refills   hydrochlorothiazide (HYDRODIURIL) 12.5 MG tablet [Pharmacy Med Name: HYDROCHLOROTHIAZIDE 12.5 MG TB] 30 tablet 0    Sig: TAKE 1 TABLET BY MOUTH DAILY AS NEEDED.     Cardiovascular: Diuretics - Thiazide Passed - 04/30/2022  1:25 AM      Passed - Cr in normal range and within 180 days    Creat  Date Value Ref Range Status  03/08/2022 0.63 0.50 - 1.05 mg/dL Final   Creatinine,U  Date Value Ref Range Status  05/21/2019 87.7 mg/dL Final   Creatinine, Urine  Date Value Ref Range Status  03/08/2022 31 20 - 275 mg/dL Final         Passed - K in normal range and within 180 days    Potassium  Date Value Ref Range Status  03/08/2022 4.6 3.5 - 5.3 mmol/L Final  09/01/2013 3.0 (L) 3.5 - 5.1 mmol/L Final         Passed - Na in normal range and within 180 days    Sodium  Date Value Ref Range Status  03/08/2022 138 135 - 146 mmol/L Final  09/01/2013 138 136 - 145 mmol/L Final         Passed - Last BP in normal range    BP Readings from Last 1 Encounters:  03/22/22 136/80         Passed - Valid encounter within last 6 months    Recent Outpatient Visits           1 month ago Encounter for general adult medical examination with abnormal findings   [facility] [place], Coralie Keens, NP   7 months ago Adjustment disorder with mixed anxiety and depressed mood   [facility] [place], Coralie Keens, NP   11 months ago Recurrent UTI (urinary tract infection)   [place], Coralie Keens, NP   1 year ago Encounter for general adult medical examination with abnormal findings   [facility] [place], Coralie Keens, NP

## 2022-05-28 ENCOUNTER — Other Ambulatory Visit: Payer: Self-pay | Admitting: Internal Medicine

## 2022-05-31 ENCOUNTER — Other Ambulatory Visit: Payer: Self-pay

## 2022-05-31 DIAGNOSIS — C44729 Squamous cell carcinoma of skin of left lower limb, including hip: Secondary | ICD-10-CM | POA: Diagnosis not present

## 2022-05-31 DIAGNOSIS — D2372 Other benign neoplasm of skin of left lower limb, including hip: Secondary | ICD-10-CM | POA: Diagnosis not present

## 2022-05-31 DIAGNOSIS — L905 Scar conditions and fibrosis of skin: Secondary | ICD-10-CM | POA: Diagnosis not present

## 2022-05-31 MED ORDER — CEPHALEXIN 500 MG PO CAPS
ORAL_CAPSULE | ORAL | 0 refills | Status: DC
  Filled 2022-05-31: qty 21, 7d supply, fill #0

## 2022-09-24 ENCOUNTER — Other Ambulatory Visit: Payer: Self-pay | Admitting: Internal Medicine

## 2022-09-25 ENCOUNTER — Other Ambulatory Visit: Payer: Self-pay

## 2022-09-25 MED ORDER — LISINOPRIL 5 MG PO TABS
5.0000 mg | ORAL_TABLET | Freq: Every day | ORAL | 0 refills | Status: DC
  Filled 2022-09-25: qty 90, 90d supply, fill #0

## 2022-09-25 MED ORDER — ROSUVASTATIN CALCIUM 5 MG PO TABS
5.0000 mg | ORAL_TABLET | Freq: Every day | ORAL | 0 refills | Status: DC
  Filled 2022-09-25: qty 90, 90d supply, fill #0

## 2022-09-25 MED ORDER — ATENOLOL-CHLORTHALIDONE 50-25 MG PO TABS
1.0000 | ORAL_TABLET | Freq: Every day | ORAL | 0 refills | Status: DC
  Filled 2022-09-25: qty 90, 90d supply, fill #0

## 2022-09-25 NOTE — Telephone Encounter (Signed)
Requested Prescriptions  Pending Prescriptions Disp Refills   rosuvastatin (CRESTOR) 5 MG tablet 90 tablet 0    Sig: TAKE 1 TABLET (5 MG TOTAL) BY MOUTH DAILY.     Cardiovascular:  Antilipid - Statins 2 Failed - 09/24/2022 10:33 AM      Failed - Lipid Panel in normal range within the last 12 months    Cholesterol  Date Value Ref Range Status  03/08/2022 178 <200 mg/dL Final   LDL Cholesterol (Calc)  Date Value Ref Range Status  03/08/2022 95 mg/dL (calc) Final    Comment:    Reference range: <100 . Desirable range <100 mg/dL for primary prevention;   <70 mg/dL for patients with CHD or diabetic patients  with > or = 2 CHD risk factors. Marland Kitchen LDL-C is now calculated using the Martin-Hopkins  calculation, which is a validated novel method providing  better accuracy than the Friedewald equation in the  estimation of LDL-C.  Horald Pollen et al. Lenox Ahr. 1610;960(45): 2061-2068  (http://education.Quest[employer].com/faq/FAQ164)    Direct LDL  Date Value Ref Range Status  06/25/2018 103.0 mg/dL Final    Comment:    Optimal:  <100 mg/dLNear or Above Optimal:  100-129 mg/dLBorderline High:  130-159 mg/dLHigh:  160-189 mg/dLVery High:  >190 mg/dL   HDL  Date Value Ref Range Status  03/08/2022 60 > OR = 50 mg/dL Final   Triglycerides  Date Value Ref Range Status  03/08/2022 126 <150 mg/dL Final         Passed - Cr in normal range and within 360 days    Creat  Date Value Ref Range Status  03/08/2022 0.63 0.50 - 1.05 mg/dL Final   Creatinine,U  Date Value Ref Range Status  05/21/2019 87.7 mg/dL Final   Creatinine, Urine  Date Value Ref Range Status  03/08/2022 31 20 - 275 mg/dL Final         Passed - Patient is not pregnant      Passed - Valid encounter within last 12 months    Recent Outpatient Visits           6 months ago Encounter for general adult medical examination with abnormal findings   [place] [facility] [place], Salvadore Oxford, NP   1 year ago  Adjustment disorder with mixed anxiety and depressed mood   [place] [facility] [place], Salvadore Oxford, NP   1 year ago Recurrent UTI (urinary tract infection)   [place] [facility], Salvadore Oxford, NP   1 year ago Encounter for general adult medical examination with abnormal findings   [place] [facility] [place], Salvadore Oxford, NP       Future Appointments             In 2 days [place], Salvadore Oxford, NP [facility]             lisinopril (ZESTRIL) 5 MG tablet 90 tablet 0    Sig: TAKE 1 TABLET (5 MG TOTAL) BY MOUTH DAILY.     Cardiovascular:  ACE Inhibitors Failed - 09/24/2022 10:33 AM      Failed - Cr in normal range and within 180 days    Creat  Date Value Ref Range Status  03/08/2022 0.63 0.50 - 1.05 mg/dL Final   Creatinine,U  Date Value Ref Range Status  05/21/2019 87.7 mg/dL Final   Creatinine, Urine  Date Value Ref Range Status  03/08/2022 31 20 -  275 mg/dL Final         Failed - K in normal range and within 180 days    Potassium  Date Value Ref Range Status  03/08/2022 4.6 3.5 - 5.3 mmol/L Final  09/01/2013 3.0 (L) 3.5 - 5.1 mmol/L Final         Failed - Valid encounter within last 6 months    Recent Outpatient Visits           6 months ago Encounter for general adult medical examination with abnormal findings   [place] [facility] [place], [place] W, NP   1 year ago Adjustment disorder with mixed anxiety and depressed mood   [place] [facility] [place], Salvadore Oxford, NP   1 year ago Recurrent UTI (urinary tract infection)   [place] [facility] [place], Salvadore Oxford, NP   1 year ago Encounter for general adult medical examination with abnormal findings   [place] [facility] [place], Salvadore Oxford, NP       Future Appointments             In 2 days Sampson Si, Salvadore Oxford, NP [place] [facility]             Passed - Patient is not pregnant      Passed - Last BP in normal range    BP Readings from Last 1 Encounters:  03/22/22 136/80          atenolol-chlorthalidone (TENORETIC) 50-25 MG tablet 90 tablet 0    Sig: TAKE 1 TABLET BY MOUTH DAILY.     Cardiovascular: Beta Blocker + Diuretic Combos Failed - 09/24/2022 10:33 AM      Failed - K in normal range and within 180 days    Potassium  Date Value Ref Range Status  03/08/2022 4.6 3.5 - 5.3 mmol/L Final  09/01/2013 3.0 (L) 3.5 - 5.1 mmol/L Final         Failed - Na in normal range and within 180 days    Sodium  Date Value Ref Range Status  03/08/2022 138 135 - 146 mmol/L Final  09/01/2013 138 136 - 145 mmol/L Final         Failed - Cr in normal range and within 180 days    Creat  Date Value Ref Range Status  03/08/2022 0.63 0.50 - 1.05 mg/dL Final   Creatinine,U  Date Value Ref Range Status  05/21/2019 87.7 mg/dL Final   Creatinine, Urine  Date Value Ref Range Status  03/08/2022 31 20 - 275 mg/dL Final         Failed - eGFR in normal range and within 180 days    EGFR (African American)  Date Value Ref Range Status  09/01/2013 >60  Final   EGFR (Non-African Amer.)  Date Value Ref Range Status  09/01/2013 >60  Final    Comment:    eGFR values <64mL/min/1.73 m2 may be an indication of chronic kidney disease (CKD). Calculated eGFR is useful in patients with stable renal function. The eGFR calculation will not be reliable in acutely ill patients when serum creatinine is changing rapidly. It is not useful in  patients on dialysis. The eGFR calculation may not be applicable to patients at the low and high extremes of body sizes, pregnant women, and vegetarians.    GFR  Date Value Ref Range Status  08/25/2020 98.04 >60.00 mL/min Final    Comment:  Calculated using the CKD-EPI Creatinine Equation (2021)   eGFR  Date Value Ref Range Status  03/08/2022 100 > OR = 60 mL/min/1.6m2 Final         Failed  - Valid encounter within last 6 months    Recent Outpatient Visits           6 months ago Encounter for general adult medical examination with abnormal findings   [place] [facility] [place], [place] W, NP   1 year ago Adjustment disorder with mixed anxiety and depressed mood   [place] [facility] [place], Salvadore Oxford, NP   1 year ago Recurrent UTI (urinary tract infection)   [place] [facility] [place], Salvadore Oxford, NP   1 year ago Encounter for general adult medical examination with abnormal findings   [place] [facility] [place], Salvadore Oxford, NP       Future Appointments             In 2 days Sampson Si, Salvadore Oxford, NP [facility]            Passed - Last BP in normal range    BP Readings from Last 1 Encounters:  03/22/22 136/80         Passed - Last Heart Rate in normal range    Pulse Readings from Last 1 Encounters:  03/22/22 63

## 2022-09-27 ENCOUNTER — Encounter: Payer: Self-pay | Admitting: Internal Medicine

## 2022-09-27 ENCOUNTER — Other Ambulatory Visit: Payer: Self-pay

## 2022-09-27 ENCOUNTER — Ambulatory Visit (INDEPENDENT_AMBULATORY_CARE_PROVIDER_SITE_OTHER): Payer: 59 | Admitting: Internal Medicine

## 2022-09-27 VITALS — BP 126/64 | HR 56 | Temp 96.2°F | Wt 199.0 lb

## 2022-09-27 DIAGNOSIS — E7800 Pure hypercholesterolemia, unspecified: Secondary | ICD-10-CM

## 2022-09-27 DIAGNOSIS — F4323 Adjustment disorder with mixed anxiety and depressed mood: Secondary | ICD-10-CM | POA: Diagnosis not present

## 2022-09-27 DIAGNOSIS — I10 Essential (primary) hypertension: Secondary | ICD-10-CM

## 2022-09-27 DIAGNOSIS — E119 Type 2 diabetes mellitus without complications: Secondary | ICD-10-CM

## 2022-09-27 DIAGNOSIS — M8589 Other specified disorders of bone density and structure, multiple sites: Secondary | ICD-10-CM | POA: Diagnosis not present

## 2022-09-27 DIAGNOSIS — L900 Lichen sclerosus et atrophicus: Secondary | ICD-10-CM

## 2022-09-27 DIAGNOSIS — N3946 Mixed incontinence: Secondary | ICD-10-CM | POA: Diagnosis not present

## 2022-09-27 DIAGNOSIS — E663 Overweight: Secondary | ICD-10-CM

## 2022-09-27 DIAGNOSIS — Z6829 Body mass index (BMI) 29.0-29.9, adult: Secondary | ICD-10-CM

## 2022-09-27 LAB — POCT GLYCOSYLATED HEMOGLOBIN (HGB A1C): Hemoglobin A1C: 5.3 % (ref 4.0–5.6)

## 2022-09-27 MED ORDER — CIPROFLOXACIN HCL 500 MG PO TABS
500.0000 mg | ORAL_TABLET | Freq: Two times a day (BID) | ORAL | 0 refills | Status: AC
  Filled 2022-09-27: qty 20, 10d supply, fill #0

## 2022-09-27 NOTE — Assessment & Plan Note (Signed)
POCT A1c 5.3% Encourage low diet and exercise for weight loss Encouraged routine eye exam Encouraged routine foot exam

## 2022-09-27 NOTE — Assessment & Plan Note (Signed)
Encourage diet and exercise for weight loss 

## 2022-09-27 NOTE — Assessment & Plan Note (Signed)
Continue calcium and vitamin D Encourage daily weightbearing exercise 

## 2022-09-27 NOTE — Assessment & Plan Note (Signed)
Stable off meds. ?

## 2022-09-27 NOTE — Assessment & Plan Note (Signed)
Continue clobetasol She will continue to follow with dermatology

## 2022-09-27 NOTE — Progress Notes (Signed)
Subjective:    Patient ID: Hannah Giles, female    DOB: Jan 12, 1961, 62 y.o.   MRN: [id number]  HPI  Patient presents to clinic today for 14-month follow-up of chronic conditions.  HTN: Her BP today is 126/64.  She is taking Lisinopril and Atenolol-Chlorthalidone as prescribed.  She takes HCTZ only as needed. There is no ECG on file.  HLD: Her last LDL was 95, triglycerides 409, 03/2022.  She denies myalgias on Rosuvastatin.  She tries to consume a low-fat diet.  She would like to have a CT cardiac score done.  DM 2: Her last A1c was 5.1%, 03/2022.  She is not taking any oral diabetic medication at this time.  She does not check her sugars.  She checks her feet routinely.  Her last eye exam was 12/223.  Flu 02/2022.  Pneumovax 02/2020.  COVID Pfizer x 2.  Anxiety and Depression: Currently not an issue off medications.  She is not currently seeing a therapist.  She denies SI/HI.  Osteopenia: She is taking Calcium and Vitamin D OTC.  She does get weightbearing exercise daily.  Bone density from 09/2020 reviewed.  Mixed Incontinence: She reports mainly urgency and frequency.  She has had the [facility] procedure and is not currently taking any medications for this.  She does not follow with urology.  Lichen Sclerosus: Managed with Clobetasol.  She follows with dermatology.  Review of Systems     Past Medical History:  Diagnosis Date   Hyperlipidemia    Hypertension    Urinary incontinence     Current Outpatient Medications  Medication Sig Dispense Refill   atenolol-chlorthalidone (TENORETIC) 50-25 MG tablet Take 1 tablet by mouth daily. 90 tablet 0   Azelaic Acid (FINACEA) 15 % gel Apply topically daily. 50 g 0   Biotin 81191 MCG TABS      Calcium Carb-Cholecalciferol 500-400 MG-UNIT TABS      cephALEXin (KEFLEX) 500 MG capsule Take one capsule three times daily 21 capsule 0   Cholecalciferol (VITAMIN D) 50 MCG (2000 UT) CAPS      clobetasol cream (TEMOVATE) 0.05 % Apply twice daily  to affected areas on arms and groin until resolved, then discontinue 60 g 1   clobetasol ointment (TEMOVATE) 0.05 % Apply 1 Application topically 2 (two) times daily. 60 g 1   hydrochlorothiazide (HYDRODIURIL) 12.5 MG tablet TAKE 1 TABLET BY MOUTH EVERY DAY AS NEEDED (NOT COVERED AT [facility]) 30 tablet 0   Ivermectin (SOOLANTRA) 1 % CREA Apply to face every morning 45 g 5   lisinopril (ZESTRIL) 5 MG tablet Take 1 tablet (5 mg total) by mouth daily. 90 tablet 0   polyethylene glycol (MIRALAX / GLYCOLAX) packet Take 17 g by mouth daily.     rosuvastatin (CRESTOR) 5 MG tablet Take 1 tablet (5 mg total) by mouth daily. 90 tablet 0   trimethoprim-polymyxin b (POLYTRIM) ophthalmic solution Place 1 drop into the left eye every 4 (four) hours. 10 mL 0   No current facility-administered medications for this visit.    Allergies  Allergen Reactions   Niacin     REACTION: flushing, SOB face numb    Family History  Problem Relation Age of Onset   Colon cancer Mother    Heart disease Father        s/p stents in the 29's   Dementia Father    Hypertension Sister    Hypertension Sister    Pancreatic cancer Paternal Grandfather    Diabetes Paternal  Uncle    Rectal cancer Neg Hx    Stomach cancer Neg Hx     Social History   Socioeconomic History   Marital status: Married    Spouse name: Not on file   Number of children: Not on file   Years of education: Not on file   Highest education level: Bachelor's degree (e.g., BA, AB, BS)  Occupational History   Occupation: Charity fundraiser, Advertising copywriter  Tobacco Use   Smoking status: Former    Packs/day: 0.50    Years: 13.00    Additional pack years: 0.00    Total pack years: 6.50    Types: Cigarettes    Quit date: 06/04/1989    Years since quitting: 33.3   Smokeless tobacco: Never  Vaping Use   Vaping Use: Never used  Substance and Sexual Activity   Alcohol use: Yes    Alcohol/week: 3.0 - 4.0 standard drinks of alcohol    Types: 3 - 4 Glasses of wine  per week    Comment: socially   Drug use: No   Sexual activity: Not on file  Other Topics Concern   Not on file  Social History Narrative   Not on file   Social Determinants of Health   Financial Resource Strain: Low Risk  (09/23/2022)   Overall Financial Resource Strain (CARDIA)    Difficulty of Paying Living Expenses: Not hard at all  Food Insecurity: No Food Insecurity (09/23/2022)   Hunger Vital Sign    Worried About Running Out of Food in the Last Year: Never true    Ran Out of Food in the Last Year: Never true  Transportation Needs: No Transportation Needs (09/23/2022)   PRAPARE - Administrator, Civil Service (Medical): No    Lack of Transportation (Non-Medical): No  Physical Activity: Unknown (09/23/2022)   Exercise Vital Sign    Days of Exercise per Week: 0 days    Minutes of Exercise per Session: Not on file  Stress: No Stress Concern Present (09/23/2022)   [employer] of Occupational Health - Occupational Stress Questionnaire    Feeling of Stress : Not at all  Social Connections: Moderately Isolated (09/23/2022)   Social Connection and Isolation Panel [NHANES]    Frequency of Communication with Friends and Family: More than three times a week    Frequency of Social Gatherings with Friends and Family: Twice a week    Attends Religious Services: Never    Database administrator or Organizations: No    Attends Engineer, structural: Not on file    Marital Status: Married  Catering manager Violence: Not on file     Constitutional: Denies fever, malaise, fatigue, headache or abrupt weight changes.  HEENT: Denies eye pain, eye redness, ear pain, ringing in the ears, wax buildup, runny nose, nasal congestion, bloody nose, or sore throat. Respiratory: Denies difficulty breathing, shortness of breath, cough or sputum production.   Cardiovascular: Denies chest pain, chest tightness, palpitations or swelling in the hands or feet.  Gastrointestinal:  Denies abdominal pain, bloating, constipation, diarrhea or blood in the stool.  GU: Pt reports urgency or frequency. Denies pain with urination, burning sensation, blood in urine, odor or discharge. Musculoskeletal: Denies decrease in range of motion, difficulty with gait, muscle pain or joint pain and swelling.  Skin: Patient reports rash to involve and thighs.  Denies redness, lesions or ulcercations.  Neurological: Denies dizziness, difficulty with memory, difficulty with speech or problems with balance and coordination.  Psych: Patient has a history of anxiety and depression.  Denies SI/HI.  No other specific complaints in a complete review of systems (except as listed in HPI above).  Objective:   Physical Exam   BP 126/64 (BP Location: Left Arm, Patient Position: Sitting, Cuff Size: Normal)   Pulse (!) 56   Temp (!) 96.2 F (35.7 C) (Temporal)   Wt 199 lb (90.3 kg)   BMI 29.39 kg/m   Wt Readings from Last 3 Encounters:  03/22/22 193 lb (87.5 kg)  09/14/21 189 lb (85.7 kg)  05/18/21 193 lb 1 oz (87.6 kg)    General: Appears her stated age, overweight, in NAD. Skin: Warm, dry and intact. Hypopigmentation noted of thighs. HEENT: Head: normal shape and size; Eyes: sclera white, no icterus, conjunctiva pink, PERRLA and EOMs intact;  Neck:  Neck supple, trachea midline. No masses, lumps or thyromegaly present.  Cardiovascular: Bradycardic with normal rhythm. S1,S2 noted.  No murmur, rubs or gallops noted. No JVD or BLE edema. No carotid bruits noted. Pulmonary/Chest: Normal effort and positive vesicular breath sounds. No respiratory distress. No wheezes, rales or ronchi noted.  Musculoskeletal:  No difficulty with gait.  Neurological: Alert and oriented. Coordination normal.  Psychiatric: Mood and affect normal. Behavior is normal. Judgment and thought content normal.     BMET    Component Value Date/Time   NA 138 03/08/2022 0905   NA 138 09/01/2013 0017   K 4.6 03/08/2022  0905   K 3.0 (L) 09/01/2013 0017   CL 97 (L) 03/08/2022 0905   CL 100 09/01/2013 0017   CO2 32 03/08/2022 0905   CO2 33 (H) 09/01/2013 0017   GLUCOSE 91 03/08/2022 0905   GLUCOSE 208 (H) 09/01/2013 0017   BUN 15 03/08/2022 0905   BUN 18 09/01/2013 0017   CREATININE 0.63 03/08/2022 0905   CALCIUM 9.9 03/08/2022 0905   CALCIUM 9.3 09/01/2013 0017   GFRNONAA >60 09/01/2013 0017   GFRAA >60 09/01/2013 0017    Lipid Panel     Component Value Date/Time   CHOL 178 03/08/2022 0905   TRIG 126 03/08/2022 0905   HDL 60 03/08/2022 0905   CHOLHDL 3.0 03/08/2022 0905   VLDL 21.6 08/25/2020 0928   LDLCALC 95 03/08/2022 0905    CBC    Component Value Date/Time   WBC 9.6 03/08/2022 0905   RBC 4.54 03/08/2022 0905   HGB 14.3 03/08/2022 0905   HGB 13.8 09/01/2013 0017   HCT 41.7 03/08/2022 0905   HCT 40.9 09/01/2013 0017   PLT 189 03/08/2022 0905   PLT 216 09/01/2013 0017   MCV 91.9 03/08/2022 0905   MCV 85 09/01/2013 0017   MCH 31.5 03/08/2022 0905   MCHC 34.3 03/08/2022 0905   RDW 12.3 03/08/2022 0905   RDW 13.9 09/01/2013 0017   LYMPHSABS 2.3 04/01/2015 0738   MONOABS 0.3 04/01/2015 0738   EOSABS 0.1 04/01/2015 0738   BASOSABS 0.0 04/01/2015 0738    Hgb A1C Lab Results  Component Value Date   HGBA1C 5.1 03/08/2022           Assessment & Plan:      RTC in 6 months for annual exam Nicki Reaper, NP

## 2022-09-27 NOTE — Assessment & Plan Note (Signed)
Controlled on atenolol-chlorthalidone, lisinopril. She takes HCTZ rarely Reinforced DASH diet and exercise for weight loss C-Met today

## 2022-09-27 NOTE — Assessment & Plan Note (Signed)
Encourage Kegel exercises

## 2022-09-27 NOTE — Patient Instructions (Signed)

## 2022-09-27 NOTE — Assessment & Plan Note (Signed)
C-Met and lipid profile today Encouraged her to consume a low-fat diet Continue rosuvastatin CT cardiac score ordered

## 2022-09-28 ENCOUNTER — Other Ambulatory Visit (HOSPITAL_COMMUNITY): Payer: Self-pay

## 2022-09-28 ENCOUNTER — Other Ambulatory Visit: Payer: Self-pay

## 2022-09-28 LAB — CBC
HCT: 43.3 % (ref 35.0–45.0)
Hemoglobin: 14.4 g/dL (ref 11.7–15.5)
MCH: 30.3 pg (ref 27.0–33.0)
MCHC: 33.3 g/dL (ref 32.0–36.0)
MCV: 91.2 fL (ref 80.0–100.0)
MPV: 10.1 fL (ref 7.5–12.5)
Platelets: 208 10*3/uL (ref 140–400)
RBC: 4.75 10*6/uL (ref 3.80–5.10)
RDW: 12.6 % (ref 11.0–15.0)
WBC: 8.7 10*3/uL (ref 3.8–10.8)

## 2022-09-28 LAB — COMPLETE METABOLIC PANEL WITH GFR
AG Ratio: 1.8 (calc) (ref 1.0–2.5)
ALT: 17 U/L (ref 6–29)
AST: 21 U/L (ref 10–35)
Albumin: 4.5 g/dL (ref 3.6–5.1)
Alkaline phosphatase (APISO): 64 U/L (ref 37–153)
BUN/Creatinine Ratio: 32 (calc) — ABNORMAL HIGH (ref 6–22)
BUN: 15 mg/dL (ref 7–25)
CO2: 29 mmol/L (ref 20–32)
Calcium: 9.7 mg/dL (ref 8.6–10.4)
Chloride: 99 mmol/L (ref 98–110)
Creat: 0.47 mg/dL — ABNORMAL LOW (ref 0.50–1.05)
Globulin: 2.5 g/dL (calc) (ref 1.9–3.7)
Glucose, Bld: 96 mg/dL (ref 65–99)
Potassium: 4.1 mmol/L (ref 3.5–5.3)
Sodium: 139 mmol/L (ref 135–146)
Total Bilirubin: 0.5 mg/dL (ref 0.2–1.2)
Total Protein: 7 g/dL (ref 6.1–8.1)
eGFR: 107 mL/min/{1.73_m2} (ref >=60)

## 2022-09-28 LAB — LIPID PANEL
Cholesterol: 183 mg/dL (ref <200)
HDL: 62 mg/dL (ref >=50)
LDL Cholesterol (Calc): 97 mg/dL (calc)
Non-HDL Cholesterol (Calc): 121 mg/dL (calc) (ref <130)
Total CHOL/HDL Ratio: 3 (calc) (ref <5.0)
Triglycerides: 144 mg/dL (ref <150)

## 2022-09-28 LAB — MICROALBUMIN / CREATININE URINE RATIO
Creatinine, Urine: 55 mg/dL (ref 20–275)
Microalb Creat Ratio: 5 mg/g creat (ref <30)
Microalb, Ur: 0.3 mg/dL

## 2022-10-11 ENCOUNTER — Encounter: Payer: Self-pay | Admitting: Internal Medicine

## 2022-10-11 DIAGNOSIS — Z1231 Encounter for screening mammogram for malignant neoplasm of breast: Secondary | ICD-10-CM | POA: Diagnosis not present

## 2022-10-11 DIAGNOSIS — M8589 Other specified disorders of bone density and structure, multiple sites: Secondary | ICD-10-CM

## 2022-10-11 DIAGNOSIS — Z01419 Encounter for gynecological examination (general) (routine) without abnormal findings: Secondary | ICD-10-CM | POA: Diagnosis not present

## 2022-10-11 DIAGNOSIS — Z6829 Body mass index (BMI) 29.0-29.9, adult: Secondary | ICD-10-CM | POA: Diagnosis not present

## 2022-10-11 LAB — HM MAMMOGRAPHY

## 2022-10-16 ENCOUNTER — Encounter: Payer: Self-pay | Admitting: Internal Medicine

## 2022-10-22 ENCOUNTER — Other Ambulatory Visit: Payer: Self-pay | Admitting: Internal Medicine

## 2022-10-22 DIAGNOSIS — E7800 Pure hypercholesterolemia, unspecified: Secondary | ICD-10-CM

## 2022-10-25 ENCOUNTER — Other Ambulatory Visit: Payer: Self-pay

## 2022-10-25 ENCOUNTER — Other Ambulatory Visit (HOSPITAL_COMMUNITY): Payer: Self-pay

## 2022-10-25 ENCOUNTER — Ambulatory Visit: Admission: RE | Admit: 2022-10-25 | Payer: Commercial Managed Care - PPO | Source: Ambulatory Visit

## 2022-10-25 ENCOUNTER — Ambulatory Visit
Admission: RE | Admit: 2022-10-25 | Discharge: 2022-10-25 | Disposition: A | Discharge status: Home / Self Care | Source: Ambulatory Visit | Attending: Internal Medicine | Admitting: Internal Medicine

## 2022-10-25 DIAGNOSIS — D225 Melanocytic nevi of trunk: Secondary | ICD-10-CM | POA: Diagnosis not present

## 2022-10-25 DIAGNOSIS — D2261 Melanocytic nevi of right upper limb, including shoulder: Secondary | ICD-10-CM | POA: Diagnosis not present

## 2022-10-25 DIAGNOSIS — D2262 Melanocytic nevi of left upper limb, including shoulder: Secondary | ICD-10-CM | POA: Diagnosis not present

## 2022-10-25 DIAGNOSIS — E7800 Pure hypercholesterolemia, unspecified: Secondary | ICD-10-CM | POA: Insufficient documentation

## 2022-10-25 DIAGNOSIS — D2272 Melanocytic nevi of left lower limb, including hip: Secondary | ICD-10-CM | POA: Diagnosis not present

## 2022-10-25 DIAGNOSIS — Z08 Encounter for follow-up examination after completed treatment for malignant neoplasm: Secondary | ICD-10-CM | POA: Diagnosis not present

## 2022-10-25 DIAGNOSIS — Z85828 Personal history of other malignant neoplasm of skin: Secondary | ICD-10-CM | POA: Diagnosis not present

## 2022-10-25 DIAGNOSIS — L900 Lichen sclerosus et atrophicus: Secondary | ICD-10-CM | POA: Diagnosis not present

## 2022-10-25 MED ORDER — CALCIPOTRIENE 0.005 % EX CREA
TOPICAL_CREAM | CUTANEOUS | 0 refills | Status: AC
  Filled 2022-10-25 (×3): qty 60, 30d supply, fill #0

## 2022-10-25 MED ORDER — CLOBETASOL PROPIONATE 0.05 % EX OINT
TOPICAL_OINTMENT | CUTANEOUS | 0 refills | Status: DC
  Filled 2022-10-25 (×3): qty 60, 30d supply, fill #0

## 2022-10-25 MED ORDER — METRONIDAZOLE 1 % EX GEL
CUTANEOUS | 2 refills | Status: AC
  Filled 2022-10-25 (×4): qty 60, 30d supply, fill #0

## 2022-11-01 ENCOUNTER — Other Ambulatory Visit (HOSPITAL_COMMUNITY): Payer: Self-pay

## 2022-11-01 ENCOUNTER — Encounter: Payer: Self-pay | Admitting: Internal Medicine

## 2022-11-29 ENCOUNTER — Ambulatory Visit
Admission: RE | Admit: 2022-11-29 | Discharge: 2022-11-29 | Disposition: A | Discharge status: Home / Self Care | Payer: 59 | Source: Ambulatory Visit | Attending: Internal Medicine | Admitting: Internal Medicine

## 2022-11-29 ENCOUNTER — Encounter: Payer: Self-pay | Admitting: Internal Medicine

## 2022-11-29 DIAGNOSIS — M8589 Other specified disorders of bone density and structure, multiple sites: Secondary | ICD-10-CM | POA: Diagnosis not present

## 2022-11-29 DIAGNOSIS — Z780 Asymptomatic menopausal state: Secondary | ICD-10-CM | POA: Diagnosis not present

## 2022-12-19 ENCOUNTER — Other Ambulatory Visit: Payer: Self-pay

## 2022-12-19 ENCOUNTER — Encounter: Payer: Self-pay | Admitting: Internal Medicine

## 2022-12-19 MED ORDER — LISINOPRIL 5 MG PO TABS
5.0000 mg | ORAL_TABLET | Freq: Every day | ORAL | 0 refills | Status: DC
  Filled 2022-12-19: qty 90, 90d supply, fill #0

## 2022-12-19 MED ORDER — ATENOLOL-CHLORTHALIDONE 50-25 MG PO TABS
1.0000 | ORAL_TABLET | Freq: Every day | ORAL | 0 refills | Status: DC
  Filled 2022-12-19: qty 90, 90d supply, fill #0

## 2022-12-19 MED ORDER — ROSUVASTATIN CALCIUM 5 MG PO TABS
5.0000 mg | ORAL_TABLET | Freq: Every day | ORAL | 0 refills | Status: DC
  Filled 2022-12-19: qty 90, 90d supply, fill #0

## 2023-01-17 DIAGNOSIS — H25813 Combined forms of age-related cataract, bilateral: Secondary | ICD-10-CM | POA: Diagnosis not present

## 2023-01-21 ENCOUNTER — Telehealth: Payer: 59 | Admitting: Physician Assistant

## 2023-01-21 DIAGNOSIS — J069 Acute upper respiratory infection, unspecified: Secondary | ICD-10-CM

## 2023-01-21 MED ORDER — NAPROXEN 500 MG PO TABS: 500.0000 mg | ORAL_TABLET | Freq: Two times a day (BID) | ORAL | 0 refills | Status: DC

## 2023-01-21 MED ORDER — BENZONATATE 100 MG PO CAPS: 100.0000 mg | ORAL_CAPSULE | Freq: Three times a day (TID) | ORAL | 0 refills | Status: DC | PRN

## 2023-01-21 MED ORDER — FLUTICASONE PROPIONATE 50 MCG/ACT NA SUSP
2.0000 | Freq: Every day | NASAL | 0 refills | Status: DC
Start: 2023-01-21 — End: 2023-04-25

## 2023-01-21 NOTE — Progress Notes (Signed)
 E-Visit for [employer] Virus / COVID Screening  Your current symptoms could be consistent with COVID.  Please complete a Covid test either at home or check with your local pharmacy to see if they provide testing.    You have tested positive for COVID-19, meaning that you were infected with the novel coronavirus and could give the virus to others.  Most people with COVID-19 have mild illness and can recover at home without medical care. Do not leave your home, except to get medical care. Do not visit public areas and do not go to places where you are unable to wear a mask. It is important that you stay home  to take care for yourself and to help protect other people in your home and community.      Isolation Instructions:   You are to isolate at home until you have been fever free for at least 24 hours without a fever-reducing medication, and symptoms have been steadily improving for 24 hours. At that time,  you can end isolation but need to mask for an additional 5 days.  If you must be around other household members who do not have symptoms, you need to make sure that both you and the family members are masking consistently with a high-quality mask.  If you note any worsening of symptoms despite treatment, please seek an in-person evaluation ASAP. If you note any significant shortness of breath or any chest pain, please seek ER evaluation. Please do not delay care!  Go to the nearest hospital ED for assessment if fever/cough/breathlessness are severe or illness seems like a threat to life.    The following symptoms may appear 2-14 days after exposure: Fever Cough Shortness of breath or difficulty breathing Chills Repeated shaking with chills Muscle pain Headache Sore throat New loss of taste or smell Fatigue Congestion or runny nose Nausea or vomiting Diarrhea  You can use medication such as prescription cough medication called Tessalon Perles 100 mg. You may take 1-2 capsules every 8  hours as needed for cough, prescription anti-inflammatory called Naprosyn 500 mg. Take twice daily as needed for fever or body aches for 2 weeks, and prescription for Fluticasone nasal spray 2 sprays in each nostril one time per day  You may also take acetaminophen (Tylenol) as needed for fever.  HOME CARE Only take medications as instructed by your medical team. Drink plenty of fluids and get plenty of rest. A steam or ultrasonic humidifier can help if you have congestion.  GET HELP RIGHT AWAY IF YOU HAVE EMERGENCY WARNING SIGNS.  Call 911 or proceed to your closest emergency facility if: You develop worsening high fever. Trouble breathing Bluish lips or face Persistent pain or pressure in the chest New confusion Inability to wake or stay awake You cough up blood. Your symptoms become more severe Inability to hold down food or fluids  This list is not all possible symptoms. Contact your medical provider for any symptoms that are severe or concerning to you.   Your e-visit answers were reviewed by a board certified advanced clinical practitioner to complete your personal care plan.  Depending on the condition, your plan could have included both over the counter or prescription medications.  If there is a problem, please reply once you have received a response from your provider.  Your safety is important to [place].  If you have drug allergies check your prescription carefully.    You can use MyChart to ask questions about today's visit, request a  non-urgent call back, or ask for a work or school excuse for 24 hours related to this e-Visit. If it has been greater than 24 hours you will need to follow up with your provider or enter a new e-Visit to address those concerns. You will get an e-mail in the next two days asking about your experience.  I hope that your e-visit has been valuable and will speed your recovery. Thank you for using e-visits.    I have spent 5 minutes in review of  e-visit questionnaire, review and updating patient chart, medical decision making and response to patient.   Margaretann Loveless, PA-C

## 2023-01-24 ENCOUNTER — Other Ambulatory Visit: Payer: Self-pay

## 2023-01-25 ENCOUNTER — Ambulatory Visit: Payer: 59 | Admitting: Internal Medicine

## 2023-01-31 ENCOUNTER — Ambulatory Visit: Payer: 59 | Admitting: Internal Medicine

## 2023-02-18 ENCOUNTER — Other Ambulatory Visit (HOSPITAL_COMMUNITY): Payer: Self-pay

## 2023-03-05 ENCOUNTER — Other Ambulatory Visit: Payer: Self-pay

## 2023-03-05 MED ORDER — FLULAVAL 0.5 ML IM SUSY
0.5000 mL | PREFILLED_SYRINGE | Freq: Once | INTRAMUSCULAR | 0 refills | Status: AC
  Filled 2023-03-05: qty 0.5, 1d supply, fill #0

## 2023-03-13 ENCOUNTER — Other Ambulatory Visit: Payer: Self-pay

## 2023-03-18 ENCOUNTER — Other Ambulatory Visit (HOSPITAL_COMMUNITY): Payer: Self-pay

## 2023-03-18 ENCOUNTER — Other Ambulatory Visit: Payer: Self-pay | Admitting: Internal Medicine

## 2023-03-19 ENCOUNTER — Other Ambulatory Visit: Payer: Self-pay | Admitting: Internal Medicine

## 2023-03-19 ENCOUNTER — Other Ambulatory Visit (HOSPITAL_COMMUNITY): Payer: Self-pay

## 2023-03-19 ENCOUNTER — Other Ambulatory Visit: Payer: Self-pay

## 2023-03-19 MED ORDER — ATENOLOL-CHLORTHALIDONE 50-25 MG PO TABS
1.0000 | ORAL_TABLET | Freq: Every day | ORAL | 0 refills | Status: DC
  Filled 2023-03-19: qty 90, 90d supply, fill #0

## 2023-03-19 MED ORDER — ROSUVASTATIN CALCIUM 5 MG PO TABS
5.0000 mg | ORAL_TABLET | Freq: Every day | ORAL | 0 refills | Status: DC
  Filled 2023-03-19: qty 90, 90d supply, fill #0

## 2023-03-19 MED ORDER — LISINOPRIL 5 MG PO TABS
5.0000 mg | ORAL_TABLET | Freq: Every day | ORAL | 0 refills | Status: DC
  Filled 2023-03-19: qty 90, 90d supply, fill #0

## 2023-03-19 NOTE — Telephone Encounter (Signed)
Requested Prescriptions  Pending Prescriptions Disp Refills   atenolol-chlorthalidone (TENORETIC) 50-25 MG tablet 90 tablet 0    Sig: Take 1 tablet by mouth daily.     Cardiovascular: Beta Blocker + Diuretic Combos Failed - 03/19/2023  9:14 AM      Failed - Cr in normal range and within 180 days    Creat  Date Value Ref Range Status  09/27/2022 0.47 (L) 0.50 - 1.05 mg/dL Final   Creatinine,U  Date Value Ref Range Status  05/21/2019 87.7 mg/dL Final   Creatinine, Urine  Date Value Ref Range Status  09/27/2022 55 20 - 275 mg/dL Final         Passed - K in normal range and within 180 days    Potassium  Date Value Ref Range Status  09/27/2022 4.1 3.5 - 5.3 mmol/L Final  09/01/2013 3.0 (L) 3.5 - 5.1 mmol/L Final         Passed - Na in normal range and within 180 days    Sodium  Date Value Ref Range Status  09/27/2022 139 135 - 146 mmol/L Final  09/01/2013 138 136 - 145 mmol/L Final         Passed - eGFR in normal range and within 180 days    EGFR (African American)  Date Value Ref Range Status  09/01/2013 >60  Final   EGFR (Non-African Amer.)  Date Value Ref Range Status  09/01/2013 >60  Final    Comment:    eGFR values <58mL/min/1.73 m2 may be an indication of chronic kidney disease (CKD). Calculated eGFR is useful in patients with stable renal function. The eGFR calculation will not be reliable in acutely ill patients when serum creatinine is changing rapidly. It is not useful in  patients on dialysis. The eGFR calculation may not be applicable to patients at the low and high extremes of body sizes, pregnant women, and vegetarians.    GFR  Date Value Ref Range Status  08/25/2020 98.04 >60.00 mL/min Final    Comment:    Calculated using the CKD-EPI Creatinine Equation (2021)   eGFR  Date Value Ref Range Status  09/27/2022 107 > OR = 60 mL/min/1.39m2 Final         Passed - Last BP in normal range    BP Readings from Last 1 Encounters:  09/27/22 126/64          Passed - Last Heart Rate in normal range    Pulse Readings from Last 1 Encounters:  09/27/22 (!) 56         Passed - Valid encounter within last 6 months    Recent Outpatient Visits           5 months ago Type 2 diabetes mellitus without complication, without long-term current use of insulin [facility])   [place] [facility] [place], Salvadore Oxford, NP   12 months ago Encounter for general adult medical examination with abnormal findings   [place] [facility] [place], [place] W, NP   1 year ago Adjustment disorder with mixed anxiety and depressed mood   [place] [facility] [place], Salvadore Oxford, NP   1 year ago Recurrent UTI (urinary tract infection)   [place] [facility] [place], Salvadore Oxford, NP   2 years ago Encounter for general adult medical examination with abnormal findings   [place] [facility] [place], Salvadore Oxford, NP       Future Appointments  In 1 month Ba[place], Salvadore Oxford, NP [place] [facility]             lisinopril (ZESTRIL) 5 MG tablet 90 tablet 0    Sig: Take 1 tablet (5 mg total) by mouth daily.     Cardiovascular:  ACE Inhibitors Failed - 03/19/2023  9:14 AM      Failed - Cr in normal range and within 180 days    Creat  Date Value Ref Range Status  09/27/2022 0.47 (L) 0.50 - 1.05 mg/dL Final   Creatinine,U  Date Value Ref Range Status  05/21/2019 87.7 mg/dL Final   Creatinine, Urine  Date Value Ref Range Status  09/27/2022 55 20 - 275 mg/dL Final         Passed - K in normal range and within 180 days    Potassium  Date Value Ref Range Status  09/27/2022 4.1 3.5 - 5.3 mmol/L Final  09/01/2013 3.0 (L) 3.5 - 5.1 mmol/L Final         Passed - Patient is not pregnant      Passed - Last BP in normal range    BP Readings from Last 1 Encounters:  09/27/22 126/64         Passed - Valid encounter within last 6 months    Recent Outpatient  Visits           5 months ago Type 2 diabetes mellitus without complication, without long-term current use of insulin [facility])   [place] [facility] [place], Salvadore Oxford, NP   12 months ago Encounter for general adult medical examination with abnormal findings   [place] [facility] [place], [place] W, NP   1 year ago Adjustment disorder with mixed anxiety and depressed mood   [place] [facility] [place], Salvadore Oxford, NP   1 year ago Recurrent UTI (urinary tract infection)   [place] [facility] [place], Salvadore Oxford, NP   2 years ago Encounter for general adult medical examination with abnormal findings   [facility] [facility] [place], Salvadore Oxford, NP       Future Appointments             In 1 month [place], Salvadore Oxford, NP [facility]             rosuvastatin (CRESTOR) 5 MG tablet 90 tablet 0    Sig: Take 1 tablet (5 mg total) by mouth daily.     Cardiovascular:  Antilipid - Statins 2 Failed - 03/19/2023  9:14 AM      Failed - Cr in normal range and within 360 days    Creat  Date Value Ref Range Status  09/27/2022 0.47 (L) 0.50 - 1.05 mg/dL Final   Creatinine,U  Date Value Ref Range Status  05/21/2019 87.7 mg/dL Final   Creatinine, Urine  Date Value Ref Range Status  09/27/2022 55 20 - 275 mg/dL Final         Failed - Lipid Panel in normal range within the last 12 months    Cholesterol  Date Value Ref Range Status  09/27/2022 183 <200 mg/dL Final   LDL Cholesterol (Calc)  Date Value Ref Range Status  09/27/2022 97 mg/dL (calc) Final    Comment:    Reference range: <100 . Desirable range <100 mg/dL for primary prevention;   <70 mg/dL for patients with CHD or diabetic  patients  with > or = 2 CHD risk factors. Marland Kitchen LDL-C is now calculated using the Martin-Hopkins  calculation, which is a validated novel method providing  better accuracy than the Friedewald  equation in the  estimation of LDL-C.  Horald Pollen et al. Lenox Ahr. 1610;960(45): 2061-2068  (http://education.[employer].com/faq/FAQ164)    Direct LDL  Date Value Ref Range Status  06/25/2018 103.0 mg/dL Final    Comment:    Optimal:  <100 mg/dLNear or Above Optimal:  100-129 mg/dLBorderline High:  130-159 mg/dLHigh:  160-189 mg/dLVery High:  >190 mg/dL   HDL  Date Value Ref Range Status  09/27/2022 62 > OR = 50 mg/dL Final   Triglycerides  Date Value Ref Range Status  09/27/2022 144 <150 mg/dL Final         Passed - Patient is not pregnant      Passed - Valid encounter within last 12 months    Recent Outpatient Visits           5 months ago Type 2 diabetes mellitus without complication, without long-term current use of insulin [facility])   [place] [facility] [place], Salvadore Oxford, NP   12 months ago Encounter for general adult medical examination with abnormal findings   [place] [facility] [place], [place] W, NP   1 year ago Adjustment disorder with mixed anxiety and depressed mood   [facility] [place], Salvadore Oxford, NP   1 year ago Recurrent UTI (urinary tract infection)   [place] [facility] [place], Salvadore Oxford, NP   2 years ago Encounter for general adult medical examination with abnormal findings   [place] [facility] [place], Salvadore Oxford, NP       Future Appointments             In 1 month Baity, Salvadore Oxford, NP [place] [facility]

## 2023-03-20 ENCOUNTER — Other Ambulatory Visit (HOSPITAL_COMMUNITY): Payer: Self-pay

## 2023-03-20 ENCOUNTER — Other Ambulatory Visit: Payer: Self-pay

## 2023-03-28 ENCOUNTER — Encounter: Payer: 59 | Admitting: Internal Medicine

## 2023-04-08 ENCOUNTER — Encounter: Payer: Self-pay | Admitting: Internal Medicine

## 2023-04-25 ENCOUNTER — Ambulatory Visit (INDEPENDENT_AMBULATORY_CARE_PROVIDER_SITE_OTHER): Payer: 59 | Admitting: Internal Medicine

## 2023-04-25 ENCOUNTER — Encounter: Payer: Self-pay | Admitting: Internal Medicine

## 2023-04-25 VITALS — BP 134/74 | Ht 69.0 in | Wt 202.0 lb

## 2023-04-25 DIAGNOSIS — E663 Overweight: Secondary | ICD-10-CM | POA: Diagnosis not present

## 2023-04-25 DIAGNOSIS — Z0001 Encounter for general adult medical examination with abnormal findings: Secondary | ICD-10-CM

## 2023-04-25 DIAGNOSIS — E7800 Pure hypercholesterolemia, unspecified: Secondary | ICD-10-CM | POA: Diagnosis not present

## 2023-04-25 DIAGNOSIS — Z6829 Body mass index (BMI) 29.0-29.9, adult: Secondary | ICD-10-CM

## 2023-04-25 DIAGNOSIS — R739 Hyperglycemia, unspecified: Secondary | ICD-10-CM
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
## 2023-04-25 NOTE — Assessment & Plan Note (Signed)
Encouraged diet and exercise for weight loss ?

## 2023-04-25 NOTE — Patient Instructions (Signed)
Health Maintenance for Postmenopausal Women Menopause is a normal process in which your ability to get pregnant comes to an end. This process happens slowly over many months or years, usually between the ages of 48 and 55. Menopause is complete when you have missed your menstrual period for 12 months. It is important to talk with your health care provider about some of the most common conditions that affect women after menopause (postmenopausal women). These include heart disease, cancer, and bone loss (osteoporosis). Adopting a healthy lifestyle and getting preventive care can help to promote your health and wellness. The actions you take can also lower your chances of developing some of these common conditions. What are the signs and symptoms of menopause? During menopause, you may have the following symptoms: Hot flashes. These can be moderate or severe. Night sweats. Decrease in sex drive. Mood swings. Headaches. Tiredness (fatigue). Irritability. Memory problems. Problems falling asleep or staying asleep. Talk with your health care provider about treatment options for your symptoms. Do I need hormone replacement therapy? Hormone replacement therapy is effective in treating symptoms that are caused by menopause, such as hot flashes and night sweats. Hormone replacement carries certain risks, especially as you become older. If you are thinking about using estrogen or estrogen with progestin, discuss the benefits and risks with your health care provider. How can I reduce my risk for heart disease and stroke? The risk of heart disease, heart attack, and stroke increases as you age. One of the causes may be a change in the body's hormones during menopause. This can affect how your body uses dietary fats, triglycerides, and cholesterol. Heart attack and stroke are medical emergencies. There are many things that you can do to help prevent heart disease and stroke. Watch your blood pressure High  blood pressure causes heart disease and increases the risk of stroke. This is more likely to develop in people who have high blood pressure readings or are overweight. Have your blood pressure checked: Every 3-5 years if you are 18-39 years of age. Every year if you are 40 years old or older. Eat a healthy diet  Eat a diet that includes plenty of vegetables, fruits, low-fat dairy products, and lean protein. Do not eat a lot of foods that are high in solid fats, added sugars, or sodium. Get regular exercise Get regular exercise. This is one of the most important things you can do for your health. Most adults should: Try to exercise for at least 150 minutes each week. The exercise should increase your heart rate and make you sweat (moderate-intensity exercise). Try to do strengthening exercises at least twice each week. Do these in addition to the moderate-intensity exercise. Spend less time sitting. Even light physical activity can be beneficial. Other tips Work with your health care provider to achieve or maintain a healthy weight. Do not use any products that contain nicotine or tobacco. These products include cigarettes, chewing tobacco, and vaping devices, such as e-cigarettes. If you need help quitting, ask your health care provider. Know your numbers. Ask your health care provider to check your cholesterol and your blood sugar (glucose). Continue to have your blood tested as directed by your health care provider. Do I need screening for cancer? Depending on your health history and family history, you may need to have cancer screenings at different stages of your life. This may include screening for: Breast cancer. Cervical cancer. Lung cancer. Colorectal cancer. What is my risk for osteoporosis? After menopause, you may be   at increased risk for osteoporosis. Osteoporosis is a condition in which bone destruction happens more quickly than new bone creation. To help prevent osteoporosis or  the bone fractures that can happen because of osteoporosis, you may take the following actions: If you are 19-50 years old, get at least 1,000 mg of calcium and at least 600 international units (IU) of vitamin D per day. If you are older than age 50 but younger than age 70, get at least 1,200 mg of calcium and at least 600 international units (IU) of vitamin D per day. If you are older than age 70, get at least 1,200 mg of calcium and at least 800 international units (IU) of vitamin D per day. Smoking and drinking excessive alcohol increase the risk of osteoporosis. Eat foods that are rich in calcium and vitamin D, and do weight-bearing exercises several times each week as directed by your health care provider. How does menopause affect my mental health? Depression may occur at any age, but it is more common as you become older. Common symptoms of depression include: Feeling depressed. Changes in sleep patterns. Changes in appetite or eating patterns. Feeling an overall lack of motivation or enjoyment of activities that you previously enjoyed. Frequent crying spells. Talk with your health care provider if you think that you are experiencing any of these symptoms. General instructions See your health care provider for regular wellness exams and vaccines. This may include: Scheduling regular health, dental, and eye exams. Getting and maintaining your vaccines. These include: Influenza vaccine. Get this vaccine each year before the flu season begins. Pneumonia vaccine. Shingles vaccine. Tetanus, diphtheria, and pertussis (Tdap) booster vaccine. Your health care provider may also recommend other immunizations. Tell your health care provider if you have ever been abused or do not feel safe at home. Summary Menopause is a normal process in which your ability to get pregnant comes to an end. This condition causes hot flashes, night sweats, decreased interest in sex, mood swings, headaches, or lack  of sleep. Treatment for this condition may include hormone replacement therapy. Take actions to keep yourself healthy, including exercising regularly, eating a healthy diet, watching your weight, and checking your blood pressure and blood sugar levels. Get screened for cancer and depression. Make sure that you are up to date with all your vaccines. This information is not intended to replace advice given to you by your health care provider. Make sure you discuss any questions you have with your health care provider. Document Revised: 10/10/2020 Document Reviewed: 10/10/2020 [employer] Patient Education  2024 [employer]  

## 2023-04-26 ENCOUNTER — Encounter: Payer: 59 | Admitting: Internal Medicine

## 2023-04-26 LAB — CBC
HCT: 42.7 % (ref 35.0–45.0)
Hemoglobin: 14.2 g/dL (ref 11.7–15.5)
MCH: 30.5 pg (ref 27.0–33.0)
MCHC: 33.3 g/dL (ref 32.0–36.0)
MCV: 91.8 fL (ref 80.0–100.0)
MPV: 10.1 fL (ref 7.5–12.5)
Platelets: 214 10*3/uL (ref 140–400)
RBC: 4.65 10*6/uL (ref 3.80–5.10)
RDW: 12.3 % (ref 11.0–15.0)
WBC: 8.9 10*3/uL (ref 3.8–10.8)

## 2023-04-26 LAB — HEMOGLOBIN A1C
Hgb A1c MFr Bld: 5.3 %{Hb} (ref <5.7)
Mean Plasma Glucose: 105 mg/dL
eAG (mmol/L): 5.8 mmol/L

## 2023-04-26 LAB — LIPID PANEL
Cholesterol: 180 mg/dL (ref <200)
HDL: 71 mg/dL (ref >=50)
LDL Cholesterol (Calc): 92 mg/dL
Non-HDL Cholesterol (Calc): 109 mg/dL (ref <130)
Total CHOL/HDL Ratio: 2.5 (calc) (ref <5.0)
Triglycerides: 81 mg/dL (ref <150)

## 2023-04-26 LAB — COMPLETE METABOLIC PANEL WITH GFR
AG Ratio: 1.5 (calc) (ref 1.0–2.5)
ALT: 17 U/L (ref 6–29)
AST: 17 U/L (ref 10–35)
Albumin: 4.4 g/dL (ref 3.6–5.1)
Alkaline phosphatase (APISO): 77 U/L (ref 37–153)
BUN: 18 mg/dL (ref 7–25)
CO2: 32 mmol/L (ref 20–32)
Calcium: 10.4 mg/dL (ref 8.6–10.4)
Chloride: 100 mmol/L (ref 98–110)
Creat: 0.57 mg/dL (ref 0.50–1.05)
Globulin: 3 g/dL (ref 1.9–3.7)
Glucose, Bld: 94 mg/dL (ref 65–99)
Potassium: 4.2 mmol/L (ref 3.5–5.3)
Sodium: 140 mmol/L (ref 135–146)
Total Bilirubin: 0.4 mg/dL (ref 0.2–1.2)
Total Protein: 7.4 g/dL (ref 6.1–8.1)
eGFR: 101 mL/min/{1.73_m2} (ref >=60)

## 2023-05-16 ENCOUNTER — Other Ambulatory Visit (HOSPITAL_COMMUNITY): Payer: Self-pay

## 2023-05-16 DIAGNOSIS — D225 Melanocytic nevi of trunk: Secondary | ICD-10-CM | POA: Diagnosis not present

## 2023-05-16 DIAGNOSIS — D2262 Melanocytic nevi of left upper limb, including shoulder: Secondary | ICD-10-CM | POA: Diagnosis not present

## 2023-05-16 DIAGNOSIS — L710 Perioral dermatitis: Secondary | ICD-10-CM | POA: Diagnosis not present

## 2023-05-16 DIAGNOSIS — D2261 Melanocytic nevi of right upper limb, including shoulder: Secondary | ICD-10-CM | POA: Diagnosis not present

## 2023-05-16 DIAGNOSIS — L900 Lichen sclerosus et atrophicus: Secondary | ICD-10-CM | POA: Diagnosis not present

## 2023-05-16 DIAGNOSIS — Z85828 Personal history of other malignant neoplasm of skin: Secondary | ICD-10-CM | POA: Diagnosis not present

## 2023-05-16 DIAGNOSIS — D2272 Melanocytic nevi of left lower limb, including hip: Secondary | ICD-10-CM | POA: Diagnosis not present

## 2023-05-16 MED ORDER — CALCIPOTRIENE 0.005 % EX CREA
1.0000 | TOPICAL_CREAM | Freq: Two times a day (BID) | CUTANEOUS | 1 refills | Status: DC
  Filled 2023-05-16: qty 60, 30d supply, fill #0

## 2023-05-16 MED ORDER — CLOBETASOL PROPIONATE 0.05 % EX OINT
TOPICAL_OINTMENT | CUTANEOUS | 2 refills | Status: DC
  Filled 2023-05-16: qty 60, 30d supply, fill #0

## 2023-06-17 ENCOUNTER — Encounter: Payer: Self-pay | Admitting: Internal Medicine

## 2023-06-17 ENCOUNTER — Other Ambulatory Visit: Payer: Self-pay

## 2023-06-17 MED ORDER — ROSUVASTATIN CALCIUM 5 MG PO TABS
5.0000 mg | ORAL_TABLET | Freq: Every day | ORAL | 0 refills | Status: DC
  Filled 2023-06-17: qty 90, 90d supply, fill #0

## 2023-06-17 MED ORDER — LISINOPRIL 5 MG PO TABS
5.0000 mg | ORAL_TABLET | Freq: Every day | ORAL | 0 refills | Status: DC
  Filled 2023-06-17: qty 90, 90d supply, fill #0

## 2023-06-17 MED ORDER — ATENOLOL-CHLORTHALIDONE 50-25 MG PO TABS
1.0000 | ORAL_TABLET | Freq: Every day | ORAL | 0 refills | Status: DC
  Filled 2023-06-17: qty 90, 90d supply, fill #0

## 2023-06-18 ENCOUNTER — Other Ambulatory Visit (HOSPITAL_COMMUNITY): Payer: Self-pay

## 2023-07-19 ENCOUNTER — Encounter: Payer: Self-pay | Admitting: Internal Medicine

## 2023-07-22 ENCOUNTER — Encounter: Payer: Self-pay | Admitting: Internal Medicine

## 2023-07-22 ENCOUNTER — Telehealth: Payer: 59 | Admitting: Internal Medicine

## 2023-07-22 VITALS — Wt 209.0 lb

## 2023-07-22 DIAGNOSIS — E663 Overweight: Secondary | ICD-10-CM

## 2023-07-22 DIAGNOSIS — Z6829 Body mass index (BMI) 29.0-29.9, adult: Secondary | ICD-10-CM

## 2023-07-22 MED ORDER — PHENTERMINE HCL 15 MG PO CAPS: 15.0000 mg | ORAL_CAPSULE | ORAL | 0 refills | Status: DC

## 2023-07-22 NOTE — Progress Notes (Signed)
 Virtual Visit via Video Note  I connected with Hannah Giles on 07/22/23 at  4:00 PM EST by a video enabled telemedicine application and verified that I am speaking with the correct person using two identifiers.  Location: Patient: Home Provider: Office  Person's participating in this video call: Nicki Reaper, NP-C and Lauri Fiore   I discussed the limitations of evaluation and management by telemedicine and the availability of in person appointments. The patient expressed understanding and agreed to proceed.  History of Present Illness:      Discussed the use of AI scribe software for clinical note transcription with the patient, who gave verbal consent to proceed.  Hannah Giles is a 63 year old female who presents with concerns about weight management and potential use of weight loss medication.  She is experiencing difficulty managing her weight, currently at 209 pounds, and describes it as a 'runaway train.' She previously lost weight using Weight Watchers but is now unable to regain control, feeling constantly hungry. She has a long-standing issue of never feeling full, which she discussed with her husband before learning about hormonal or receptor-related causes. She is interested in finding a solution to help 'jump start' her weight loss efforts.  She has not used weight loss medications in the past and lost weight independently. She is considering phentermine, an appetite suppressant, as a potential option due to its cost and mechanism of action, which does not affect the gastrointestinal tract. She is aware that her insurance does not cover weight loss medications, which would require her to pay out of pocket.  She has a history of constipation for which she takes Miralax almost daily. She is concerned about the potential for weight loss medications to exacerbate her constipation, as some medications slow down food in the gastrointestinal tract, potentially causing constipation or  diarrhea.       Past Medical History:  Diagnosis Date   Hyperlipidemia    Hypertension    Urinary incontinence     Current Outpatient Medications  Medication Sig Dispense Refill   atenolol-chlorthalidone (TENORETIC) 50-25 MG tablet Take 1 tablet by mouth daily. 90 tablet 0   Azelaic Acid (FINACEA) 15 % gel Apply topically daily. 50 g 0   Biotin 95621 MCG TABS      calcipotriene (DOVONOX) 0.005 % cream Apply twice daily to affected areas. 60 g 0   calcipotriene (DOVONOX) 0.005 % cream Apply 1 Application topically 2 (two) times daily to affected areas. 60 g 1   Calcium Carb-Cholecalciferol 500-400 MG-UNIT TABS      Cholecalciferol (VITAMIN D) 50 MCG (2000 UT) CAPS      clobetasol cream (TEMOVATE) 0.05 % Apply twice daily to affected areas on arms and groin until resolved, then discontinue 60 g 1   clobetasol ointment (TEMOVATE) 0.05 % Apply 1 Application topically 2 (two) times daily. 60 g 1   clobetasol ointment (TEMOVATE) 0.05 % Apply twice daily to affected areas while flared, then once weekly as maintenance 60 g 0   clobetasol ointment (TEMOVATE) 0.05 % Apply twice daily to affected areas while flared, then once weekly as maintenance 60 g 2   hydrochlorothiazide (HYDRODIURIL) 12.5 MG tablet TAKE 1 TABLET BY MOUTH EVERY DAY AS NEEDED (NOT COVERED AT [facility]) 30 tablet 0   lisinopril (ZESTRIL) 5 MG tablet Take 1 tablet (5 mg total) by mouth daily. 90 tablet 0   metroNIDAZOLE (METROGEL) 1 % gel Apply to affected areas around mouth and nose nightly until rash is clear  60 g 2   polyethylene glycol (MIRALAX / GLYCOLAX) packet Take 17 g by mouth daily.     rosuvastatin (CRESTOR) 5 MG tablet Take 1 tablet (5 mg total) by mouth daily. 90 tablet 0   No current facility-administered medications for this visit.    Allergies  Allergen Reactions   Niacin     REACTION: flushing, SOB face numb    Family History  Problem Relation Age of Onset   Colon cancer Mother    Heart disease Father         s/p stents in the 60's   Dementia Father    Hypertension Sister    Hypertension Sister    Pancreatic cancer Paternal Grandfather    Diabetes Paternal Uncle    Rectal cancer Neg Hx    Stomach cancer Neg Hx     Social History   Socioeconomic History   Marital status: Married    Spouse name: Not on file   Number of children: Not on file   Years of education: Not on file   Highest education level: Bachelor's degree (e.g., BA, AB, BS)  Occupational History   Occupation: Charity fundraiser, Advertising copywriter  Tobacco Use   Smoking status: Former    Current packs/day: 0.00    Average packs/day: 0.5 packs/day for 13.0 years (6.5 ttl pk-yrs)    Types: Cigarettes    Start date: 06/04/1976    Quit date: 06/04/1989    Years since quitting: 34.1   Smokeless tobacco: Never  Vaping Use   Vaping status: Never Used  Substance and Sexual Activity   Alcohol use: Yes    Alcohol/week: 3.0 - 4.0 standard drinks of alcohol    Types: 3 - 4 Glasses of wine per week    Comment: socially   Drug use: No   Sexual activity: Not on file  Other Topics Concern   Not on file  Social History Narrative   Not on file   Social Drivers of Health   Financial Resource Strain: Low Risk  (04/21/2023)   Overall Financial Resource Strain (CARDIA)    Difficulty of Paying Living Expenses: Not hard at all  Food Insecurity: No Food Insecurity (04/21/2023)   Hunger Vital Sign    Worried About Running Out of Food in the Last Year: Never true    Ran Out of Food in the Last Year: Never true  Transportation Needs: No Transportation Needs (04/21/2023)   PRAPARE - Administrator, Civil Service (Medical): No    Lack of Transportation (Non-Medical): No  Physical Activity: Unknown (09/23/2022)   Exercise Vital Sign    Days of Exercise per Week: 0 days    Minutes of Exercise per Session: Not on file  Stress: No Stress Concern Present (04/21/2023)   [employer] of Occupational Health - Occupational Stress  Questionnaire    Feeling of Stress : Not at all  Social Connections: Moderately Isolated (04/21/2023)   Social Connection and Isolation Panel [NHANES]    Frequency of Communication with Friends and Family: Three times a week    Frequency of Social Gatherings with Friends and Family: Patient declined    Attends Religious Services: Never    Database administrator or Organizations: No    Attends Engineer, structural: Not on file    Marital Status: Married  Catering manager Violence: Not on file     Constitutional: Pt reports weight gain. Denies fever, malaise, fatigue, headache.  Respiratory: Denies difficulty breathing, shortness of breath,  cough or sputum production.   Cardiovascular: Denies chest pain, chest tightness, palpitations or swelling in the hands or feet.  Neurological: Denies dizziness, difficulty with memory, difficulty with speech or problems with balance and coordination.  Psych: Denies anxiety, depression, SI/HI.  No other specific complaints in a complete review of systems (except as listed in HPI above).  Observations/Objective:   Wt Readings from Last 3 Encounters:  04/25/23 202 lb (91.6 kg)  09/27/22 199 lb (90.3 kg)  03/22/22 193 lb (87.5 kg)    General: Appears her stated age, obese, in NAD. Pulmonary/Chest: Normal effort. No respiratory distress. Neurological: Alert and oriented.   BMET    Component Value Date/Time   NA 140 04/25/2023 1014   NA 138 09/01/2013 0017   K 4.2 04/25/2023 1014   K 3.0 (L) 09/01/2013 0017   CL 100 04/25/2023 1014   CL 100 09/01/2013 0017   CO2 32 04/25/2023 1014   CO2 33 (H) 09/01/2013 0017   GLUCOSE 94 04/25/2023 1014   GLUCOSE 208 (H) 09/01/2013 0017   BUN 18 04/25/2023 1014   BUN 18 09/01/2013 0017   CREATININE 0.57 04/25/2023 1014   CALCIUM 10.4 04/25/2023 1014   CALCIUM 9.3 09/01/2013 0017   GFRNONAA >60 09/01/2013 0017   GFRAA >60 09/01/2013 0017    Lipid Panel     Component Value Date/Time    CHOL 180 04/25/2023 1014   TRIG 81 04/25/2023 1014   HDL 71 04/25/2023 1014   CHOLHDL 2.5 04/25/2023 1014   VLDL 21.6 08/25/2020 0928   LDLCALC 92 04/25/2023 1014    CBC    Component Value Date/Time   WBC 8.9 04/25/2023 1014   RBC 4.65 04/25/2023 1014   HGB 14.2 04/25/2023 1014   HGB 13.8 09/01/2013 0017   HCT 42.7 04/25/2023 1014   HCT 40.9 09/01/2013 0017   PLT 214 04/25/2023 1014   PLT 216 09/01/2013 0017   MCV 91.8 04/25/2023 1014   MCV 85 09/01/2013 0017   MCH 30.5 04/25/2023 1014   MCHC 33.3 04/25/2023 1014   RDW 12.3 04/25/2023 1014   RDW 13.9 09/01/2013 0017   LYMPHSABS 2.3 04/01/2015 0738   MONOABS 0.3 04/01/2015 0738   EOSABS 0.1 04/01/2015 0738   BASOSABS 0.0 04/01/2015 0738    Hgb A1C Lab Results  Component Value Date   HGBA1C 5.3 04/25/2023       Assessment and Plan:  Assessment and Plan    Obesity   Patient reports difficulty with weight control, currently at 209 lbs. Discussed various weight loss medications and their potential side effects, including GI disturbances. Patient has a history of constipation and is currently on Miralax. Patient's insurance does not cover weight loss medications.   -Start Phentermine 15mg  daily as an appetite suppressant. This medication does not affect the GI tract and is less likely to exacerbate constipation. It is also more affordable for the patient.   -Check in 1 month to assess effectiveness and potential side effects. If not effective, consider increasing the dose to 30mg  or 37.5mg .   -Advise patient to maintain a diet rich in lean proteins and green vegetables.   -[facility] [id number]     Follow Up Instructions:    I discussed the assessment and treatment plan with the patient. The patient was provided an opportunity to ask questions and all were answered. The patient agreed with the plan and demonstrated an understanding of the instructions.  The patient was  advised to call back or seek an in-person evaluation if the symptoms worsen or if the condition fails to improve as anticipated.   Nicki Reaper, NP

## 2023-09-01 ENCOUNTER — Encounter: Payer: Self-pay | Admitting: Internal Medicine

## 2023-09-02 MED ORDER — PHENTERMINE HCL 15 MG PO CAPS: 15.0000 mg | ORAL_CAPSULE | ORAL | 0 refills | Status: DC

## 2023-09-02 NOTE — Telephone Encounter (Signed)
 Yes, okay to schedule visit

## 2023-09-05 ENCOUNTER — Other Ambulatory Visit (HOSPITAL_COMMUNITY): Payer: Self-pay

## 2023-09-05 ENCOUNTER — Encounter: Payer: Self-pay | Admitting: Internal Medicine

## 2023-09-05 ENCOUNTER — Telehealth (INDEPENDENT_AMBULATORY_CARE_PROVIDER_SITE_OTHER): Admitting: Internal Medicine

## 2023-09-05 DIAGNOSIS — N3941 Urge incontinence: Secondary | ICD-10-CM

## 2023-09-05 MED ORDER — MIRABEGRON ER 25 MG PO TB24
25.0000 mg | ORAL_TABLET | Freq: Every day | ORAL | 1 refills | Status: DC
  Filled 2023-09-05: qty 90, 90d supply, fill #0

## 2023-09-05 NOTE — Patient Instructions (Signed)
 Urinary Incontinence in Females: How to Manage Urinary incontinence, or UI, happens if you can't always control when you pee. If you think you have UI, it's important to talk to your health care provider. How does UI affect me? UI can make it hard to enjoy daily activities. It can affect your social life. It can also affect your mental health. What actions can I take to manage UI? Talk to your provider. There're things you can do and treatments that can help. Change your lifestyle  Quit smoking. Do not smoke, vape, or use nicotine or tobacco. Lose weight or keep a healthy weight. Do pelvic floor muscle exercises as told. You may hear these called Kegel exercises. Stay active. Eat a healthy diet. Change your behavior Try bladder training. This may include using the bathroom at set times during the day. Use the bathroom every 3-4 hours, even if you don't feel the need to pee. Try to empty your bladder of all pee every time you go. After peeing, wait a minute. Then try to pee again. Find ways to reduce bladder urges. This can include distraction techniques or controlled breathing exercises. Make sure you're in a relaxed position while peeing. If needed, wear pads to absorb pee that leaks. Get treatment Treatment for UI depends on the type of incontinence that you have and its cause. This may include: Medicines to relax the bladder muscles. Treatments, such as: Pulses of electricity to help change bladder reflexes (electrical nerve stimulation). A shot of collagen or carbon beads into the bladder opening. This can help thicken tissue and close the bladder opening. Botox injections to relax the bladder muscles. Surgery. Products such as: A pessary. This is a device to prevent pee leaks. It's placed in your vagina to help support your pelvic floor muscles. A catheter. This is a soft tube that's put into your urethra to drain pee from the bladder. The catheter may be connected to a bag that  collects pee. Portable commodes, bedpans, or urinals.  Follow these instructions at home: Eating and drinking Change your diet as told. You may be asked to: Drink fluids in small amounts throughout the day instead of large amounts at one time. Use less caffeine or alcohol. Eat foods that are high in fiber, such as beans, whole grains, or fresh fruits and vegetables. General instructions Take your medicines only as told. Keep all follow-up visits. Your provider may need to change your treatment plan if needed. Where to find more information The Office on Women's Health: TravelLesson.ca The [employer] (ACOG): acog.org Contact a health care provide if: Your symptoms don't get better after treatment. Your symptoms get worse. You have new symptoms. This information is not intended to replace advice given to you by your health care provider. Make sure you discuss any questions you have with your health care provider. Document Revised: 04/24/2023 Document Reviewed: 02/21/2023 Elsevier Patient Education  2024 [employer].

## 2023-09-05 NOTE — Progress Notes (Signed)
 Virtual Visit via Video Note  I connected with Hannah Giles on 09/05/23 at  2:00 PM EDT by a video enabled telemedicine application and verified that I am speaking with the correct person using two identifiers.  Location: Patient: Home Provider: Office  Person's participating in this video call: Hannah Reaper, NP-C and Lauri Vanderploeg   I discussed the limitations of evaluation and management by telemedicine and the availability of in person appointments. The patient expressed understanding and agreed to proceed.  History of Present Illness:   Discussed the use of AI scribe software for clinical note transcription with the patient, who gave verbal consent to proceed.  Hannah Giles is a 63 year old female who presents with urinary incontinence.  She experiences urge incontinence, characterized by an immediate need to urinate to prevent accidents. This condition has been long-standing and has worsened with age. She has not previously used medications like oxybutynin, ditropan, or myrbetriq, but is now considering them due to the severity of her symptoms.  The incontinence significantly impacts her daily life, causing concern about traveling. She typically uses a couple of pads during the day and wakes up three to four times at night to urinate, despite cutting off liquids. She produces a large volume of urine at night, and sometimes the volume exceeds the capacity of the pad, causing distress, especially with upcoming travel plans.  She has previously attended pelvic floor physical therapy in [place], which she found helpful, but she does not consistently perform the recommended Kegel exercises.      Past Medical History:  Diagnosis Date   Hyperlipidemia    Hypertension    Urinary incontinence     Current Outpatient Medications  Medication Sig Dispense Refill   atenolol-chlorthalidone (TENORETIC) 50-25 MG tablet Take 1 tablet by mouth daily. 90 tablet 0   Azelaic Acid (FINACEA) 15 %  gel Apply topically daily. 50 g 0   Biotin 40981 MCG TABS      calcipotriene (DOVONOX) 0.005 % cream Apply twice daily to affected areas. 60 g 0   calcipotriene (DOVONOX) 0.005 % cream Apply 1 Application topically 2 (two) times daily to affected areas. 60 g 1   Calcium Carb-Cholecalciferol 500-400 MG-UNIT TABS      Cholecalciferol (VITAMIN D) 50 MCG (2000 UT) CAPS      clobetasol cream (TEMOVATE) 0.05 % Apply twice daily to affected areas on arms and groin until resolved, then discontinue 60 g 1   clobetasol ointment (TEMOVATE) 0.05 % Apply 1 Application topically 2 (two) times daily. 60 g 1   clobetasol ointment (TEMOVATE) 0.05 % Apply twice daily to affected areas while flared, then once weekly as maintenance 60 g 0   clobetasol ointment (TEMOVATE) 0.05 % Apply twice daily to affected areas while flared, then once weekly as maintenance 60 g 2   hydrochlorothiazide (HYDRODIURIL) 12.5 MG tablet TAKE 1 TABLET BY MOUTH EVERY DAY AS NEEDED (NOT COVERED AT [facility]) 30 tablet 0   lisinopril (ZESTRIL) 5 MG tablet Take 1 tablet (5 mg total) by mouth daily. 90 tablet 0   metroNIDAZOLE (METROGEL) 1 % gel Apply to affected areas around mouth and nose nightly until rash is clear 60 g 2   phentermine 15 MG capsule Take 1 capsule (15 mg total) by mouth every morning. 30 capsule 0   polyethylene glycol (MIRALAX / GLYCOLAX) packet Take 17 g by mouth daily.     rosuvastatin (CRESTOR) 5 MG tablet Take 1 tablet (5 mg total) by mouth daily. 90  tablet 0   No current facility-administered medications for this visit.    Allergies  Allergen Reactions   Niacin     REACTION: flushing, SOB face numb    Family History  Problem Relation Age of Onset   Colon cancer Mother    Heart disease Father        s/p stents in the 60's   Dementia Father    Hypertension Sister    Hypertension Sister    Pancreatic cancer Paternal Grandfather    Diabetes Paternal Uncle    Rectal cancer Neg Hx    Stomach cancer Neg Hx      Social History   Socioeconomic History   Marital status: Married    Spouse name: Not on file   Number of children: Not on file   Years of education: Not on file   Highest education level: Bachelor's degree (e.g., BA, AB, BS)  Occupational History   Occupation: Charity fundraiser, Advertising copywriter  Tobacco Use   Smoking status: Former    Current packs/day: 0.00    Average packs/day: 0.5 packs/day for 13.0 years (6.5 ttl pk-yrs)    Types: Cigarettes    Start date: 06/04/1976    Quit date: 06/04/1989    Years since quitting: 34.2   Smokeless tobacco: Never  Vaping Use   Vaping status: Never Used  Substance and Sexual Activity   Alcohol use: Yes    Alcohol/week: 3.0 - 4.0 standard drinks of alcohol    Types: 3 - 4 Glasses of wine per week    Comment: socially   Drug use: No   Sexual activity: Not on file  Other Topics Concern   Not on file  Social History Narrative   Not on file   Social Drivers of Health   Financial Resource Strain: Low Risk  (09/02/2023)   Overall Financial Resource Strain (CARDIA)    Difficulty of Paying Living Expenses: Not hard at all  Food Insecurity: No Food Insecurity (09/02/2023)   Hunger Vital Sign    Worried About Running Out of Food in the Last Year: Never true    Ran Out of Food in the Last Year: Never true  Transportation Needs: No Transportation Needs (09/02/2023)   PRAPARE - Administrator, Civil Service (Medical): No    Lack of Transportation (Non-Medical): No  Physical Activity: Insufficiently Active (09/02/2023)   Exercise Vital Sign    Days of Exercise per Week: 1 day    Minutes of Exercise per Session: 20 min  Stress: No Stress Concern Present (09/02/2023)   [employer] of Occupational Health - Occupational Stress Questionnaire    Feeling of Stress : Not at all  Social Connections: Moderately Isolated (09/02/2023)   Social Connection and Isolation Panel [NHANES]    Frequency of Communication with Friends and Family: Twice a week     Frequency of Social Gatherings with Friends and Family: Once a week    Attends Religious Services: Never    Database administrator or Organizations: No    Attends Engineer, structural: Not on file    Marital Status: Married  Catering manager Violence: Not on file     Constitutional: Denies fever, malaise, fatigue, headache or abrupt weight changes.  Respiratory: Denies difficulty breathing, shortness of breath, cough or sputum production.   Cardiovascular: Denies chest pain, chest tightness, palpitations or swelling in the hands or feet.  Gastrointestinal: Denies abdominal pain, bloating, constipation, diarrhea or blood in the stool.  GU: Pt  reports urge incontinence. Denies frequency, pain with urination, burning sensation, blood in urine, odor or discharge.  No other specific complaints in a complete review of systems (except as listed in HPI above).  Observations/Objective:   Wt Readings from Last 3 Encounters:  07/22/23 209 lb (94.8 kg)  04/25/23 202 lb (91.6 kg)  09/27/22 199 lb (90.3 kg)    General: Appears her stated age, obese, in NAD. Pulmonary/Chest: Normal effort. Neurological: Alert and oriented.   BMET    Component Value Date/Time   NA 140 04/25/2023 1014   NA 138 09/01/2013 0017   K 4.2 04/25/2023 1014   K 3.0 (L) 09/01/2013 0017   CL 100 04/25/2023 1014   CL 100 09/01/2013 0017   CO2 32 04/25/2023 1014   CO2 33 (H) 09/01/2013 0017   GLUCOSE 94 04/25/2023 1014   GLUCOSE 208 (H) 09/01/2013 0017   BUN 18 04/25/2023 1014   BUN 18 09/01/2013 0017   CREATININE 0.57 04/25/2023 1014   CALCIUM 10.4 04/25/2023 1014   CALCIUM 9.3 09/01/2013 0017   GFRNONAA >60 09/01/2013 0017   GFRAA >60 09/01/2013 0017    Lipid Panel     Component Value Date/Time   CHOL 180 04/25/2023 1014   TRIG 81 04/25/2023 1014   HDL 71 04/25/2023 1014   CHOLHDL 2.5 04/25/2023 1014   VLDL 21.6 08/25/2020 0928   LDLCALC 92 04/25/2023 1014    CBC    Component Value  Date/Time   WBC 8.9 04/25/2023 1014   RBC 4.65 04/25/2023 1014   HGB 14.2 04/25/2023 1014   HGB 13.8 09/01/2013 0017   HCT 42.7 04/25/2023 1014   HCT 40.9 09/01/2013 0017   PLT 214 04/25/2023 1014   PLT 216 09/01/2013 0017   MCV 91.8 04/25/2023 1014   MCV 85 09/01/2013 0017   MCH 30.5 04/25/2023 1014   MCHC 33.3 04/25/2023 1014   RDW 12.3 04/25/2023 1014   RDW 13.9 09/01/2013 0017   LYMPHSABS 2.3 04/01/2015 0738   MONOABS 0.3 04/01/2015 0738   EOSABS 0.1 04/01/2015 0738   BASOSABS 0.0 04/01/2015 0738    Hgb A1C Lab Results  Component Value Date   HGBA1C 5.3 04/25/2023       Assessment and Plan:  Assessment and Plan    Urge Incontinence Chronic urge incontinence affects quality of life with nocturia and frequent pad use. Prefers Myrbetriq for lower side effect risk compared to older anticholinergics. - Prescribed Myrbetriq 25 mg once daily. -Encouraged Kegel exercises  RTC in 1 month for follow-up of chronic conditions  Follow Up Instructions:    I discussed the assessment and treatment plan with the patient. The patient was provided an opportunity to ask questions and all were answered. The patient agreed with the plan and demonstrated an understanding of the instructions.   The patient was advised to call back or seek an in-person evaluation if the symptoms worsen or if the condition fails to improve as anticipated.   Hannah Reaper, NP

## 2023-09-16 ENCOUNTER — Other Ambulatory Visit: Payer: Self-pay | Admitting: Internal Medicine

## 2023-09-17 ENCOUNTER — Other Ambulatory Visit (HOSPITAL_COMMUNITY): Payer: Self-pay

## 2023-09-17 MED ORDER — ROSUVASTATIN CALCIUM 5 MG PO TABS
5.0000 mg | ORAL_TABLET | Freq: Every day | ORAL | 0 refills | Status: DC
  Filled 2023-09-17: qty 90, 90d supply, fill #0

## 2023-09-17 MED ORDER — LISINOPRIL 5 MG PO TABS
5.0000 mg | ORAL_TABLET | Freq: Every day | ORAL | 0 refills | Status: DC
  Filled 2023-09-17: qty 90, 90d supply, fill #0

## 2023-09-17 MED ORDER — ATENOLOL-CHLORTHALIDONE 50-25 MG PO TABS
1.0000 | ORAL_TABLET | Freq: Every day | ORAL | 0 refills | Status: DC
  Filled 2023-09-17: qty 90, 90d supply, fill #0

## 2023-09-17 NOTE — Telephone Encounter (Signed)
 Requested Prescriptions  Pending Prescriptions Disp Refills   atenolol-chlorthalidone (TENORETIC) 50-25 MG tablet 90 tablet 0    Sig: Take 1 tablet by mouth daily.     Cardiovascular: Beta Blocker + Diuretic Combos Passed - 09/17/2023 12:34 PM      Passed - K in normal range and within 180 days    Potassium  Date Value Ref Range Status  04/25/2023 4.2 3.5 - 5.3 mmol/L Final  09/01/2013 3.0 (L) 3.5 - 5.1 mmol/L Final         Passed - Na in normal range and within 180 days    Sodium  Date Value Ref Range Status  04/25/2023 140 135 - 146 mmol/L Final  09/01/2013 138 136 - 145 mmol/L Final         Passed - Cr in normal range and within 180 days    Creat  Date Value Ref Range Status  04/25/2023 0.57 0.50 - 1.05 mg/dL Final   Creatinine,U  Date Value Ref Range Status  05/21/2019 87.7 mg/dL Final   Creatinine, Urine  Date Value Ref Range Status  09/27/2022 55 20 - 275 mg/dL Final         Passed - eGFR in normal range and within 180 days    EGFR (African American)  Date Value Ref Range Status  09/01/2013 >60  Final   EGFR (Non-African Amer.)  Date Value Ref Range Status  09/01/2013 >60  Final    Comment:    eGFR values <91mL/min/1.73 m2 may be an indication of chronic kidney disease (CKD). Calculated eGFR is useful in patients with stable renal function. The eGFR calculation will not be reliable in acutely ill patients when serum creatinine is changing rapidly. It is not useful in  patients on dialysis. The eGFR calculation may not be applicable to patients at the low and high extremes of body sizes, pregnant women, and vegetarians.    GFR  Date Value Ref Range Status  08/25/2020 98.04 >60.00 mL/min Final    Comment:    Calculated using the CKD-EPI Creatinine Equation (2021)   eGFR  Date Value Ref Range Status  04/25/2023 101 > OR = 60 mL/min/1.53m2 Final         Passed - Last BP in normal range    BP Readings from Last 1 Encounters:  04/25/23 134/74          Passed - Last Heart Rate in normal range    Pulse Readings from Last 1 Encounters:  09/27/22 (!) 56         Passed - Valid encounter within last 6 months    Recent Outpatient Visits           1 week ago Urge incontinence   [place] [facility] [place], [place] W, NP   1 month ago Overweight with body mass index (BMI) of 29 to 29.9 in adult   [facility] [place], Rankin Buzzard, NP       Future Appointments             In 1 month [place], Rankin Buzzard, NP [facility]             lisinopril (ZESTRIL) 5 MG tablet 90 tablet 0    Sig: Take 1 tablet (5 mg total) by mouth daily.     Cardiovascular:  ACE Inhibitors Passed - 09/17/2023 12:34 PM      Passed - Cr in normal range and  within 180 days    Creat  Date Value Ref Range Status  04/25/2023 0.57 0.50 - 1.05 mg/dL Final   Creatinine,U  Date Value Ref Range Status  05/21/2019 87.7 mg/dL Final   Creatinine, Urine  Date Value Ref Range Status  09/27/2022 55 20 - 275 mg/dL Final         Passed - K in normal range and within 180 days    Potassium  Date Value Ref Range Status  04/25/2023 4.2 3.5 - 5.3 mmol/L Final  09/01/2013 3.0 (L) 3.5 - 5.1 mmol/L Final         Passed - Patient is not pregnant      Passed - Last BP in normal range    BP Readings from Last 1 Encounters:  04/25/23 134/74         Passed - Valid encounter within last 6 months    Recent Outpatient Visits           1 week ago Urge incontinence   [place] [facility] [place], Salvadore Oxford, NP   1 month ago Overweight with body mass index (BMI) of 29 to 29.9 in adult   [facility] [place], Salvadore Oxford, NP       Future Appointments             In 1 month [place], Salvadore Oxford, NP [facility]             rosuvastatin (CRESTOR) 5 MG tablet 90 tablet 0    Sig: Take 1 tablet (5 mg total) by mouth daily.      Cardiovascular:  Antilipid - Statins 2 Failed - 09/17/2023 12:34 PM      Failed - Lipid Panel in normal range within the last 12 months    Cholesterol  Date Value Ref Range Status  04/25/2023 180 <200 mg/dL Final   LDL Cholesterol (Calc)  Date Value Ref Range Status  04/25/2023 92 mg/dL (calc) Final    Comment:    Reference range: <100 . Desirable range <100 mg/dL for primary prevention;   <70 mg/dL for patients with CHD or diabetic patients  with > or = 2 CHD risk factors. Marland Kitchen LDL-C is now calculated using the Martin-Hopkins  calculation, which is a validated novel method providing  better accuracy than the Friedewald equation in the  estimation of LDL-C.  Horald Pollen et al. Lenox Ahr. 9147;829(56): 2061-2068  (http://education.[facility].com/faq/FAQ164)    Direct LDL  Date Value Ref Range Status  06/25/2018 103.0 mg/dL Final    Comment:    Optimal:  <100 mg/dLNear or Above Optimal:  100-129 mg/dLBorderline High:  130-159 mg/dLHigh:  160-189 mg/dLVery High:  >190 mg/dL   HDL  Date Value Ref Range Status  04/25/2023 71 > OR = 50 mg/dL Final   Triglycerides  Date Value Ref Range Status  04/25/2023 81 <150 mg/dL Final         Passed - Cr in normal range and within 360 days    Creat  Date Value Ref Range Status  04/25/2023 0.57 0.50 - 1.05 mg/dL Final   Creatinine,U  Date Value Ref Range Status  05/21/2019 87.7 mg/dL Final   Creatinine, Urine  Date Value Ref Range Status  09/27/2022 55 20 - 275 mg/dL Final         Passed - Patient is not pregnant      Passed - Valid encounter within last 12 months  Recent Outpatient Visits           1 week ago Urge incontinence   [place] [facility] [place], [place] W, NP   1 month ago Overweight with body mass index (BMI) of 29 to 29.9 in adult   [facility] [place], Rankin Buzzard, NP       Future Appointments             In 1 month [place], Rankin Buzzard, NP [facility]

## 2023-09-26 ENCOUNTER — Other Ambulatory Visit: Payer: Self-pay

## 2023-10-02 ENCOUNTER — Encounter: Payer: Self-pay | Admitting: Internal Medicine

## 2023-10-02 MED ORDER — PHENTERMINE HCL 15 MG PO CAPS
15.0000 mg | ORAL_CAPSULE | ORAL | 0 refills | Status: DC
Start: 2023-10-02 — End: 2023-10-24

## 2023-10-24 ENCOUNTER — Other Ambulatory Visit (HOSPITAL_COMMUNITY): Payer: Self-pay

## 2023-10-24 ENCOUNTER — Other Ambulatory Visit (HOSPITAL_BASED_OUTPATIENT_CLINIC_OR_DEPARTMENT_OTHER): Payer: Self-pay

## 2023-10-24 ENCOUNTER — Encounter: Payer: Self-pay | Admitting: Pharmacist

## 2023-10-24 ENCOUNTER — Encounter: Payer: Self-pay | Admitting: Internal Medicine

## 2023-10-24 ENCOUNTER — Ambulatory Visit (INDEPENDENT_AMBULATORY_CARE_PROVIDER_SITE_OTHER): Payer: Self-pay | Admitting: Internal Medicine

## 2023-10-24 ENCOUNTER — Other Ambulatory Visit: Payer: Self-pay

## 2023-10-24 VITALS — BP 124/68 | Ht 69.0 in | Wt 201.0 lb

## 2023-10-24 DIAGNOSIS — I700 Atherosclerosis of aorta: Secondary | ICD-10-CM | POA: Diagnosis not present

## 2023-10-24 DIAGNOSIS — N3946 Mixed incontinence: Secondary | ICD-10-CM

## 2023-10-24 DIAGNOSIS — E119 Type 2 diabetes mellitus without complications: Secondary | ICD-10-CM

## 2023-10-24 DIAGNOSIS — Z6829 Body mass index (BMI) 29.0-29.9, adult: Secondary | ICD-10-CM | POA: Diagnosis not present

## 2023-10-24 DIAGNOSIS — F4323 Adjustment disorder with mixed anxiety and depressed mood: Secondary | ICD-10-CM

## 2023-10-24 DIAGNOSIS — L601 Onycholysis: Secondary | ICD-10-CM | POA: Diagnosis not present

## 2023-10-24 DIAGNOSIS — I10 Essential (primary) hypertension: Secondary | ICD-10-CM

## 2023-10-24 DIAGNOSIS — L900 Lichen sclerosus et atrophicus: Secondary | ICD-10-CM

## 2023-10-24 DIAGNOSIS — D2272 Melanocytic nevi of left lower limb, including hip: Secondary | ICD-10-CM | POA: Diagnosis not present

## 2023-10-24 DIAGNOSIS — D225 Melanocytic nevi of trunk: Secondary | ICD-10-CM | POA: Diagnosis not present

## 2023-10-24 DIAGNOSIS — E7800 Pure hypercholesterolemia, unspecified: Secondary | ICD-10-CM

## 2023-10-24 DIAGNOSIS — Z85828 Personal history of other malignant neoplasm of skin: Secondary | ICD-10-CM | POA: Diagnosis not present

## 2023-10-24 DIAGNOSIS — L718 Other rosacea: Secondary | ICD-10-CM | POA: Diagnosis not present

## 2023-10-24 DIAGNOSIS — D2262 Melanocytic nevi of left upper limb, including shoulder: Secondary | ICD-10-CM | POA: Diagnosis not present

## 2023-10-24 DIAGNOSIS — E663 Overweight: Secondary | ICD-10-CM

## 2023-10-24 DIAGNOSIS — E559 Vitamin D deficiency, unspecified: Secondary | ICD-10-CM | POA: Diagnosis not present

## 2023-10-24 DIAGNOSIS — D2261 Melanocytic nevi of right upper limb, including shoulder: Secondary | ICD-10-CM | POA: Diagnosis not present

## 2023-10-24 MED ORDER — PHENTERMINE HCL 30 MG PO CAPS: 30.0000 mg | ORAL_CAPSULE | ORAL | 0 refills | Status: DC

## 2023-10-24 MED ORDER — CIPROFLOXACIN HCL 500 MG PO TABS
500.0000 mg | ORAL_TABLET | Freq: Two times a day (BID) | ORAL | 0 refills | Status: AC
  Filled 2023-10-24: qty 10, 5d supply, fill #0

## 2023-10-24 MED ORDER — MIRABEGRON ER 50 MG PO TB24
50.0000 mg | ORAL_TABLET | Freq: Every day | ORAL | 1 refills | Status: DC
  Filled 2023-10-24: qty 90, 90d supply, fill #0
  Filled 2024-01-27: qty 90, 90d supply, fill #1

## 2023-10-24 NOTE — Assessment & Plan Note (Signed)
 Encourage Kegel exercises Will increase myrbetriq  to 50 mg daily

## 2023-10-24 NOTE — Assessment & Plan Note (Signed)
C-Met and lipid profile today  Encouraged her to consume a low-fat diet Continue rosuvastatin 

## 2023-10-24 NOTE — Assessment & Plan Note (Signed)
 C-Met and lipid profile today Encouraged her to consume a low-fat diet Continue rosuvastatin  Will have her start baby aspirin daily

## 2023-10-24 NOTE — Assessment & Plan Note (Signed)
 Stable off meds. ?

## 2023-10-24 NOTE — Progress Notes (Signed)
 Subjective:    Patient ID: Hannah Giles, female    DOB: 13-Jun-1960, 63 y.o.   MRN: [id number]  HPI  Patient presents to clinic today for 48-month follow-up of chronic conditions.  HTN: Her BP today is 124/68.  She is taking lisinopril  and atenolol -chlorthalidone  as prescribed.  She takes HCTZ only as needed. There is no ECG on file.  HLD with aortic atherosclerosis: Her last LDL was 92, triglycerides 81, 04/2023.  She denies myalgias on rosuvastatin .  She is not taking aspirin.  She tries to consume a low-fat diet.    DM 2: Her last A1c was 5.3%, 04/2023.  She is not taking any oral diabetic medication at this time.  She does not check her sugars.  She checks her feet routinely.  Her last eye exam was 05/2022.  Flu 03/2023.  Pneumovax 02/2020.  COVID Pfizer x 2.  Anxiety and depression: Currently not an issue off medications.  She is not currently seeing a therapist.  She denies SI/HI.  Mixed incontinence: She reports mainly urgency and frequency.  She has had the [place] lisa procedure in the past.  She is taking myrbetriq  as prescribed..  She does not follow with urology.  Lichen sclerosus: Managed with clobetasol .  She follows with dermatology.  Review of Systems     Past Medical History:  Diagnosis Date   Hyperlipidemia    Hypertension    Urinary incontinence     Current Outpatient Medications  Medication Sig Dispense Refill   atenolol -chlorthalidone  (TENORETIC ) 50-25 MG tablet Take 1 tablet by mouth daily. 90 tablet 0   Azelaic Acid  (FINACEA ) 15 % gel Apply topically daily. 50 g 0   Biotin 10000 MCG TABS      calcipotriene  (DOVONOX) 0.005 % cream Apply twice daily to affected areas. 60 g 0   calcipotriene  (DOVONOX) 0.005 % cream Apply 1 Application topically 2 (two) times daily to affected areas. 60 g 1   Calcium  Carb-Cholecalciferol 500-400 MG-UNIT TABS      Cholecalciferol (VITAMIN D ) 50 MCG (2000 UT) CAPS      clobetasol  cream (TEMOVATE ) 0.05 % Apply twice daily to  affected areas on arms and groin until resolved, then discontinue 60 g 1   clobetasol  ointment (TEMOVATE ) 0.05 % Apply 1 Application topically 2 (two) times daily. 60 g 1   clobetasol  ointment (TEMOVATE ) 0.05 % Apply twice daily to affected areas while flared, then once weekly as maintenance 60 g 0   clobetasol  ointment (TEMOVATE ) 0.05 % Apply twice daily to affected areas while flared, then once weekly as maintenance 60 g 2   hydrochlorothiazide  (HYDRODIURIL ) 12.5 MG tablet TAKE 1 TABLET BY MOUTH EVERY DAY AS NEEDED (NOT COVERED AT [facility]) 30 tablet 0   lisinopril  (ZESTRIL ) 5 MG tablet Take 1 tablet (5 mg total) by mouth daily. 90 tablet 0   metroNIDAZOLE  (METROGEL ) 1 % gel Apply to affected areas around mouth and nose nightly until rash is clear 60 g 2   mirabegron  ER (MYRBETRIQ ) 25 MG TB24 tablet Take 1 tablet (25 mg total) by mouth daily. 90 tablet 1   phentermine  15 MG capsule Take 1 capsule (15 mg total) by mouth every morning. 30 capsule 0   polyethylene glycol (MIRALAX  / GLYCOLAX ) packet Take 17 g by mouth daily.     rosuvastatin  (CRESTOR ) 5 MG tablet Take 1 tablet (5 mg total) by mouth daily. 90 tablet 0   No current facility-administered medications for this visit.    Allergies  Allergen Reactions  Niacin     REACTION: flushing, SOB face numb    Family History  Problem Relation Age of Onset   Colon cancer Mother    Heart disease Father        s/p stents in the 60's   Dementia Father    Hypertension Sister    Hypertension Sister    Pancreatic cancer Paternal Grandfather    Diabetes Paternal Uncle    Rectal cancer Neg Hx    Stomach cancer Neg Hx     Social History   Socioeconomic History   Marital status: Married    Spouse name: Not on file   Number of children: Not on file   Years of education: Not on file   Highest education level: Bachelor's degree (e.g., BA, AB, BS)  Occupational History   Occupation: Charity fundraiser, Advertising copywriter  Tobacco Use   Smoking status: Former     Current packs/day: 0.00    Average packs/day: 0.5 packs/day for 13.0 years (6.5 ttl pk-yrs)    Types: Cigarettes    Start date: 06/04/1976    Quit date: 06/04/1989    Years since quitting: 34.4   Smokeless tobacco: Never  Vaping Use   Vaping status: Never Used  Substance and Sexual Activity   Alcohol use: Yes    Alcohol/week: 3.0 - 4.0 standard drinks of alcohol    Types: 3 - 4 Glasses of wine per week    Comment: socially   Drug use: No   Sexual activity: Not on file  Other Topics Concern   Not on file  Social History Narrative   Not on file   Social Drivers of Health   Financial Resource Strain: Low Risk  (09/02/2023)   Overall Financial Resource Strain (CARDIA)    Difficulty of Paying Living Expenses: Not hard at all  Food Insecurity: No Food Insecurity (09/02/2023)   Hunger Vital Sign    Worried About Running Out of Food in the Last Year: Never true    Ran Out of Food in the Last Year: Never true  Transportation Needs: No Transportation Needs (09/02/2023)   PRAPARE - Administrator, Civil Service (Medical): No    Lack of Transportation (Non-Medical): No  Physical Activity: Insufficiently Active (09/02/2023)   Exercise Vital Sign    Days of Exercise per Week: 1 day    Minutes of Exercise per Session: 20 min  Stress: No Stress Concern Present (09/02/2023)   [employer] of Occupational Health - Occupational Stress Questionnaire    Feeling of Stress : Not at all  Social Connections: Moderately Isolated (09/02/2023)   Social Connection and Isolation Panel [NHANES]    Frequency of Communication with Friends and Family: Twice a week    Frequency of Social Gatherings with Friends and Family: Once a week    Attends Religious Services: Never    Database administrator or Organizations: No    Attends Engineer, structural: Not on file    Marital Status: Married  Catering manager Violence: Not on file     Constitutional: Denies fever, malaise, fatigue,  headache or abrupt weight changes.  HEENT: Denies eye pain, eye redness, ear pain, ringing in the ears, wax buildup, runny nose, nasal congestion, bloody nose, or sore throat. Respiratory: Denies difficulty breathing, shortness of breath, cough or sputum production.   Cardiovascular: Denies chest pain, chest tightness, palpitations or swelling in the hands or feet.  Gastrointestinal: Denies abdominal pain, bloating, constipation, diarrhea or blood in the stool.  GU: Pt reports urgency and frequency. Denies pain with urination, burning sensation, blood in urine, odor or discharge. Musculoskeletal: Denies decrease in range of motion, difficulty with gait, muscle pain or joint pain and swelling.  Skin: Patient reports rash to thighs.  Denies redness, lesions or ulcercations.  Neurological: Denies dizziness, difficulty with memory, difficulty with speech or problems with balance and coordination.  Psych: Patient has a history of anxiety and depression.  Denies SI/HI.  No other specific complaints in a complete review of systems (except as listed in HPI above).  Objective:   Physical Exam   BP 124/68 (BP Location: Left Arm, Patient Position: Sitting, Cuff Size: Normal)   Ht 5\' 9"  (1.753 m)   Wt 201 lb (91.2 kg)   BMI 29.68 kg/m    Wt Readings from Last 3 Encounters:  07/22/23 209 lb (94.8 kg)  04/25/23 202 lb (91.6 kg)  09/27/22 199 lb (90.3 kg)    General: Appears her stated age, overweight, in NAD. Skin: Warm, dry and intact. HEENT: Head: normal shape and size; Eyes: sclera white, no icterus, conjunctiva pink, PERRLA and EOMs intact;  Cardiovascular: Normal rate and  rhythm. S1,S2 noted.  No murmur, rubs or gallops noted. No JVD or BLE edema. No carotid bruits noted. Pulmonary/Chest: Normal effort and positive vesicular breath sounds. No respiratory distress. No wheezes, rales or ronchi noted.  Musculoskeletal:  No difficulty with gait.  Neurological: Alert and oriented. Coordination  normal.  Psychiatric: Mood and affect normal. Behavior is normal. Judgment and thought content normal.     BMET    Component Value Date/Time   NA 140 04/25/2023 1014   NA 138 09/01/2013 0017   K 4.2 04/25/2023 1014   K 3.0 (L) 09/01/2013 0017   CL 100 04/25/2023 1014   CL 100 09/01/2013 0017   CO2 32 04/25/2023 1014   CO2 33 (H) 09/01/2013 0017   GLUCOSE 94 04/25/2023 1014   GLUCOSE 208 (H) 09/01/2013 0017   BUN 18 04/25/2023 1014   BUN 18 09/01/2013 0017   CREATININE 0.57 04/25/2023 1014   CALCIUM  10.4 04/25/2023 1014   CALCIUM  9.3 09/01/2013 0017   GFRNONAA >60 09/01/2013 0017   GFRAA >60 09/01/2013 0017    Lipid Panel     Component Value Date/Time   CHOL 180 04/25/2023 1014   TRIG 81 04/25/2023 1014   HDL 71 04/25/2023 1014   CHOLHDL 2.5 04/25/2023 1014   VLDL 21.6 08/25/2020 0928   LDLCALC 92 04/25/2023 1014    CBC    Component Value Date/Time   WBC 8.9 04/25/2023 1014   RBC 4.65 04/25/2023 1014   HGB 14.2 04/25/2023 1014   HGB 13.8 09/01/2013 0017   HCT 42.7 04/25/2023 1014   HCT 40.9 09/01/2013 0017   PLT 214 04/25/2023 1014   PLT 216 09/01/2013 0017   MCV 91.8 04/25/2023 1014   MCV 85 09/01/2013 0017   MCH 30.5 04/25/2023 1014   MCHC 33.3 04/25/2023 1014   RDW 12.3 04/25/2023 1014   RDW 13.9 09/01/2013 0017   LYMPHSABS 2.3 04/01/2015 0738   MONOABS 0.3 04/01/2015 0738   EOSABS 0.1 04/01/2015 0738   BASOSABS 0.0 04/01/2015 0738    Hgb A1C Lab Results  Component Value Date   HGBA1C 5.3 04/25/2023           Assessment & Plan:      RTC in 6 months for followup chronic conditions (getting traditional medicare) Helayne Lo, NP

## 2023-10-24 NOTE — Patient Instructions (Signed)

## 2023-10-24 NOTE — Assessment & Plan Note (Signed)
Continue clobetasol She will continue to follow with dermatology

## 2023-10-24 NOTE — Assessment & Plan Note (Signed)
Controlled on atenolol-chlorthalidone, lisinopril. She takes HCTZ rarely Reinforced DASH diet and exercise for weight loss C-Met today

## 2023-10-24 NOTE — Assessment & Plan Note (Signed)
 Encouraged diet and exercise for weight loss ?

## 2023-10-25 ENCOUNTER — Ambulatory Visit: Payer: Self-pay | Admitting: Internal Medicine

## 2023-10-25 ENCOUNTER — Other Ambulatory Visit (HOSPITAL_COMMUNITY): Payer: Self-pay

## 2023-10-25 ENCOUNTER — Ambulatory Visit: Payer: 59 | Admitting: Internal Medicine

## 2023-10-25 ENCOUNTER — Other Ambulatory Visit: Payer: Self-pay

## 2023-10-25 DIAGNOSIS — Z01419 Encounter for gynecological examination (general) (routine) without abnormal findings: Secondary | ICD-10-CM | POA: Diagnosis not present

## 2023-10-25 DIAGNOSIS — N952 Postmenopausal atrophic vaginitis: Secondary | ICD-10-CM | POA: Diagnosis not present

## 2023-10-25 DIAGNOSIS — L900 Lichen sclerosus et atrophicus: Secondary | ICD-10-CM | POA: Diagnosis not present

## 2023-10-25 DIAGNOSIS — Z6829 Body mass index (BMI) 29.0-29.9, adult: Secondary | ICD-10-CM | POA: Diagnosis not present

## 2023-10-25 LAB — COMPREHENSIVE METABOLIC PANEL WITH GFR
AG Ratio: 1.7 (calc) (ref 1.0–2.5)
ALT: 12 U/L (ref 6–29)
AST: 15 U/L (ref 10–35)
Albumin: 4.4 g/dL (ref 3.6–5.1)
Alkaline phosphatase (APISO): 76 U/L (ref 37–153)
BUN: 20 mg/dL (ref 7–25)
CO2: 33 mmol/L — ABNORMAL HIGH (ref 20–32)
Calcium: 9.9 mg/dL (ref 8.6–10.4)
Chloride: 98 mmol/L (ref 98–110)
Creat: 0.52 mg/dL (ref 0.50–1.05)
Globulin: 2.6 g/dL (ref 1.9–3.7)
Glucose, Bld: 101 mg/dL — ABNORMAL HIGH (ref 65–99)
Potassium: 3.4 mmol/L — ABNORMAL LOW (ref 3.5–5.3)
Sodium: 139 mmol/L (ref 135–146)
Total Bilirubin: 0.4 mg/dL (ref 0.2–1.2)
Total Protein: 7 g/dL (ref 6.1–8.1)
eGFR: 104 mL/min/{1.73_m2} (ref >=60)

## 2023-10-25 LAB — HEMOGLOBIN A1C
Hgb A1c MFr Bld: 5.4 % (ref <5.7)
Mean Plasma Glucose: 108 mg/dL
eAG (mmol/L): 6 mmol/L

## 2023-10-25 LAB — LIPID PANEL
Cholesterol: 169 mg/dL (ref <200)
HDL: 60 mg/dL (ref >=50)
LDL Cholesterol (Calc): 88 mg/dL
Non-HDL Cholesterol (Calc): 109 mg/dL (ref <130)
Total CHOL/HDL Ratio: 2.8 (calc) (ref <5.0)
Triglycerides: 116 mg/dL (ref <150)

## 2023-10-25 LAB — MICROALBUMIN / CREATININE URINE RATIO
Creatinine, Urine: 39 mg/dL (ref 20–275)
Microalb, Ur: <0.2 mg/dL

## 2023-10-25 LAB — CBC
HCT: 41.4 % (ref 35.0–45.0)
Hemoglobin: 13.8 g/dL (ref 11.7–15.5)
MCH: 30.5 pg (ref 27.0–33.0)
MCHC: 33.3 g/dL (ref 32.0–36.0)
MCV: 91.4 fL (ref 80.0–100.0)
MPV: 9.8 fL (ref 7.5–12.5)
Platelets: 187 10*3/uL (ref 140–400)
RBC: 4.53 10*6/uL (ref 3.80–5.10)
RDW: 12.8 % (ref 11.0–15.0)
WBC: 9.1 10*3/uL (ref 3.8–10.8)

## 2023-10-25 LAB — VITAMIN D 25 HYDROXY (VIT D DEFICIENCY, FRACTURES): Vit D, 25-Hydroxy: 75 ng/mL (ref 30–100)

## 2023-10-25 MED ORDER — AZELAIC ACID 15 % EX GEL
Freq: Every day | CUTANEOUS | 5 refills | Status: AC
Start: 2023-10-24 — End: ?
  Filled 2023-10-25: qty 50, 50d supply, fill #0

## 2023-10-25 MED ORDER — ESTRADIOL 0.1 MG/GM VA CREA
0.5000 g | TOPICAL_CREAM | Freq: Every day | VAGINAL | 0 refills | Status: AC
Start: 2023-10-25 — End: 2023-11-01
  Filled 2023-10-25: qty 42.5, 7d supply, fill #0

## 2023-10-29 ENCOUNTER — Other Ambulatory Visit: Payer: Self-pay

## 2023-10-30 ENCOUNTER — Other Ambulatory Visit: Payer: Self-pay

## 2023-10-31 ENCOUNTER — Other Ambulatory Visit: Payer: Self-pay

## 2023-11-11 ENCOUNTER — Encounter: Payer: Self-pay | Admitting: Internal Medicine

## 2023-11-22 ENCOUNTER — Encounter: Payer: Self-pay | Admitting: Internal Medicine

## 2023-12-24 ENCOUNTER — Other Ambulatory Visit (HOSPITAL_COMMUNITY): Payer: Self-pay

## 2023-12-25 ENCOUNTER — Other Ambulatory Visit (HOSPITAL_COMMUNITY): Payer: Self-pay

## 2023-12-25 ENCOUNTER — Other Ambulatory Visit: Payer: Self-pay

## 2023-12-25 ENCOUNTER — Encounter: Payer: Self-pay | Admitting: Internal Medicine

## 2023-12-25 MED ORDER — ROSUVASTATIN CALCIUM 5 MG PO TABS
5.0000 mg | ORAL_TABLET | Freq: Every day | ORAL | 0 refills | Status: DC
  Filled 2023-12-25: qty 90, 90d supply, fill #0

## 2023-12-25 MED ORDER — LISINOPRIL 5 MG PO TABS
5.0000 mg | ORAL_TABLET | Freq: Every day | ORAL | 0 refills | Status: DC
  Filled 2023-12-25: qty 90, 90d supply, fill #0

## 2023-12-25 MED ORDER — ATENOLOL-CHLORTHALIDONE 50-25 MG PO TABS
1.0000 | ORAL_TABLET | Freq: Every day | ORAL | 0 refills | Status: DC
  Filled 2023-12-25: qty 90, 90d supply, fill #0

## 2024-01-27 ENCOUNTER — Other Ambulatory Visit: Payer: Self-pay

## 2024-01-27 ENCOUNTER — Other Ambulatory Visit (HOSPITAL_COMMUNITY): Payer: Self-pay

## 2024-02-05 ENCOUNTER — Encounter: Payer: Self-pay | Admitting: Internal Medicine

## 2024-02-12 ENCOUNTER — Encounter: Payer: Self-pay | Admitting: Internal Medicine

## 2024-02-12 MED ORDER — PHENTERMINE HCL 30 MG PO CAPS: 30.0000 mg | ORAL_CAPSULE | ORAL | 0 refills | Status: DC

## 2024-02-27 ENCOUNTER — Other Ambulatory Visit (HOSPITAL_COMMUNITY): Payer: Self-pay

## 2024-02-27 MED ORDER — FLUZONE 0.5 ML IM SUSY
0.5000 mL | PREFILLED_SYRINGE | Freq: Once | INTRAMUSCULAR | 0 refills | Status: AC
  Filled 2024-02-27: qty 0.5, 1d supply, fill #0

## 2024-03-02 ENCOUNTER — Other Ambulatory Visit (HOSPITAL_COMMUNITY): Payer: Self-pay

## 2024-03-17 ENCOUNTER — Encounter: Payer: Self-pay | Admitting: Internal Medicine

## 2024-03-17 ENCOUNTER — Other Ambulatory Visit: Payer: Self-pay | Admitting: Obstetrics and Gynecology

## 2024-03-17 DIAGNOSIS — Z1231 Encounter for screening mammogram for malignant neoplasm of breast: Secondary | ICD-10-CM

## 2024-03-17 MED ORDER — PHENTERMINE HCL 30 MG PO CAPS: 30.0000 mg | ORAL_CAPSULE | ORAL | 0 refills | Status: DC

## 2024-03-18 ENCOUNTER — Other Ambulatory Visit (HOSPITAL_COMMUNITY): Payer: Self-pay

## 2024-03-18 MED ORDER — CLOBETASOL PROPIONATE 0.05 % EX OINT
1.0000 | TOPICAL_OINTMENT | CUTANEOUS | 2 refills | Status: DC
  Filled 2024-03-18: qty 60, 30d supply, fill #0

## 2024-03-19 ENCOUNTER — Other Ambulatory Visit (HOSPITAL_COMMUNITY): Payer: Self-pay

## 2024-03-22 ENCOUNTER — Encounter (HOSPITAL_COMMUNITY): Payer: Self-pay

## 2024-03-22 ENCOUNTER — Other Ambulatory Visit: Payer: Self-pay | Admitting: Internal Medicine

## 2024-03-23 ENCOUNTER — Other Ambulatory Visit: Payer: Self-pay

## 2024-03-23 ENCOUNTER — Other Ambulatory Visit (HOSPITAL_COMMUNITY): Payer: Self-pay

## 2024-03-24 ENCOUNTER — Other Ambulatory Visit: Payer: Self-pay

## 2024-03-24 ENCOUNTER — Other Ambulatory Visit (HOSPITAL_COMMUNITY): Payer: Self-pay

## 2024-03-24 MED ORDER — ATENOLOL-CHLORTHALIDONE 50-25 MG PO TABS
1.0000 | ORAL_TABLET | Freq: Every day | ORAL | 0 refills | Status: DC
  Filled 2024-03-24: qty 90, 90d supply, fill #0

## 2024-03-24 MED ORDER — LISINOPRIL 5 MG PO TABS
5.0000 mg | ORAL_TABLET | Freq: Every day | ORAL | 0 refills | Status: DC
  Filled 2024-03-24: qty 90, 90d supply, fill #0

## 2024-03-24 MED ORDER — ROSUVASTATIN CALCIUM 5 MG PO TABS
5.0000 mg | ORAL_TABLET | Freq: Every day | ORAL | 0 refills | Status: DC
  Filled 2024-03-24: qty 90, 90d supply, fill #0

## 2024-03-24 NOTE — Telephone Encounter (Signed)
 Requested Prescriptions  Pending Prescriptions Disp Refills   atenolol -chlorthalidone  (TENORETIC ) 50-25 MG tablet 90 tablet 0    Sig: Take 1 tablet by mouth daily.     Cardiovascular: Beta Blocker + Diuretic Combos Failed - 03/24/2024 12:47 PM      Failed - K in normal range and within 180 days    Potassium  Date Value Ref Range Status  10/24/2023 3.4 (L) 3.5 - 5.3 mmol/L Final  09/01/2013 3.0 (L) 3.5 - 5.1 mmol/L Final         Passed - Na in normal range and within 180 days    Sodium  Date Value Ref Range Status  10/24/2023 139 135 - 146 mmol/L Final  09/01/2013 138 136 - 145 mmol/L Final         Passed - Cr in normal range and within 180 days    Creat  Date Value Ref Range Status  10/24/2023 0.52 0.50 - 1.05 mg/dL Final   Creatinine, Urine  Date Value Ref Range Status  10/24/2023 39 20 - 275 mg/dL Final         Passed - eGFR in normal range and within 180 days    EGFR (African American)  Date Value Ref Range Status  09/01/2013 >60  Final   EGFR (Non-African Amer.)  Date Value Ref Range Status  09/01/2013 >60  Final    Comment:    eGFR values <87mL/min/1.73 m2 may be an indication of chronic kidney disease (CKD). Calculated eGFR is useful in patients with stable renal function. The eGFR calculation will not be reliable in acutely ill patients when serum creatinine is changing rapidly. It is not useful in  patients on dialysis. The eGFR calculation may not be applicable to patients at the low and high extremes of body sizes, pregnant women, and vegetarians.    GFR  Date Value Ref Range Status  08/25/2020 98.04 >60.00 mL/min Final    Comment:    Calculated using the CKD-EPI Creatinine Equation (2021)   eGFR  Date Value Ref Range Status  10/24/2023 104 > OR = 60 mL/min/1.64m2 Final         Passed - Last BP in normal range    BP Readings from Last 1 Encounters:  10/24/23 124/68         Passed - Last Heart Rate in normal range    Pulse Readings from Last  1 Encounters:  09/27/22 (!) 56         Passed - Valid encounter within last 6 months    Recent Outpatient Visits           5 months ago Type 2 diabetes mellitus without complication, without long-term current use of insulin [facility])   [place] [facility] [place], Angeline ORN, NP   6 months ago Urge incontinence   [place] [facility] [place], [place] W, NP   8 months ago Overweight with body mass index (BMI) of 29 to 29.9 in adult   [facility] [place], Angeline ORN, NP               rosuvastatin  (CRESTOR ) 5 MG tablet 90 tablet 0    Sig: Take 1 tablet (5 mg total) by mouth daily.     Cardiovascular:  Antilipid - Statins 2 Failed - 03/24/2024 12:47 PM      Failed - Lipid Panel in normal range within the last 12 months    Cholesterol  Date Value Ref  Range Status  10/24/2023 169 <200 mg/dL Final   LDL Cholesterol (Calc)  Date Value Ref Range Status  10/24/2023 88 mg/dL (calc) Final    Comment:    Reference range: <100 . Desirable range <100 mg/dL for primary prevention;   <70 mg/dL for patients with CHD or diabetic patients  with > or = 2 CHD risk factors. SABRA LDL-C is now calculated using the Martin-Hopkins  calculation, which is a validated novel method providing  better accuracy than the Friedewald equation in the  estimation of LDL-C.  Gladis APPLETHWAITE et al. SANDREA. 7986;689(80): 2061-2068  (http://education.[facility].com/faq/FAQ164)    Direct LDL  Date Value Ref Range Status  06/25/2018 103.0 mg/dL Final    Comment:    Optimal:  <100 mg/dLNear or Above Optimal:  100-129 mg/dLBorderline High:  130-159 mg/dLHigh:  160-189 mg/dLVery High:  >190 mg/dL   HDL  Date Value Ref Range Status  10/24/2023 60 > OR = 50 mg/dL Final   Triglycerides  Date Value Ref Range Status  10/24/2023 116 <150 mg/dL Final         Passed - Cr in normal range and within 360 days    Creat  Date Value Ref Range Status  10/24/2023  0.52 0.50 - 1.05 mg/dL Final   Creatinine, Urine  Date Value Ref Range Status  10/24/2023 39 20 - 275 mg/dL Final         Passed - Patient is not pregnant      Passed - Valid encounter within last 12 months    Recent Outpatient Visits           5 months ago Type 2 diabetes mellitus without complication, without long-term current use of insulin [facility])   [place] [facility] [place], Angeline ORN, NP   6 months ago Urge incontinence   [place] [place], [place] W, NP   8 months ago Overweight with body mass index (BMI) of 29 to 29.9 in adult   [facility] [place], Angeline ORN, NP               lisinopril  (ZESTRIL ) 5 MG tablet 90 tablet 0    Sig: Take 1 tablet (5 mg total) by mouth daily.     Cardiovascular:  ACE Inhibitors Failed - 03/24/2024 12:47 PM      Failed - K in normal range and within 180 days    Potassium  Date Value Ref Range Status  10/24/2023 3.4 (L) 3.5 - 5.3 mmol/L Final  09/01/2013 3.0 (L) 3.5 - 5.1 mmol/L Final         Passed - Cr in normal range and within 180 days    Creat  Date Value Ref Range Status  10/24/2023 0.52 0.50 - 1.05 mg/dL Final   Creatinine, Urine  Date Value Ref Range Status  10/24/2023 39 20 - 275 mg/dL Final         Passed - Patient is not pregnant      Passed - Last BP in normal range    BP Readings from Last 1 Encounters:  10/24/23 124/68         Passed - Valid encounter within last 6 months    Recent Outpatient Visits           5 months ago Type 2 diabetes mellitus without complication, without long-term current use of insulin [facility])   [facility] [place], Angeline ORN, NP   6  months ago Urge incontinence   [place] [facility] [place], [place] W, NP   8 months ago Overweight with body mass index (BMI) of 29 to 29.9 in adult   [facility] [place], Angeline ORN, [place]

## 2024-04-03 ENCOUNTER — Other Ambulatory Visit (HOSPITAL_COMMUNITY): Payer: Self-pay

## 2024-04-03 ENCOUNTER — Other Ambulatory Visit: Payer: Self-pay

## 2024-04-03 ENCOUNTER — Ambulatory Visit
Admission: RE | Admit: 2024-04-03 | Discharge: 2024-04-03 | Disposition: A | Discharge status: Home / Self Care | Source: Ambulatory Visit | Attending: Obstetrics and Gynecology | Admitting: Obstetrics and Gynecology

## 2024-04-03 ENCOUNTER — Encounter (INDEPENDENT_AMBULATORY_CARE_PROVIDER_SITE_OTHER): Admitting: Ophthalmology

## 2024-04-03 DIAGNOSIS — I10 Essential (primary) hypertension: Secondary | ICD-10-CM

## 2024-04-03 DIAGNOSIS — H35033 Hypertensive retinopathy, bilateral: Secondary | ICD-10-CM

## 2024-04-03 DIAGNOSIS — H43813 Vitreous degeneration, bilateral: Secondary | ICD-10-CM | POA: Diagnosis not present

## 2024-04-03 DIAGNOSIS — Z1231 Encounter for screening mammogram for malignant neoplasm of breast: Secondary | ICD-10-CM

## 2024-04-03 DIAGNOSIS — H33301 Unspecified retinal break, right eye: Secondary | ICD-10-CM

## 2024-04-03 DIAGNOSIS — H2513 Age-related nuclear cataract, bilateral: Secondary | ICD-10-CM

## 2024-04-03 MED ORDER — KETOROLAC TROMETHAMINE 0.5 % OP SOLN
1.0000 [drp] | Freq: Four times a day (QID) | OPHTHALMIC | 1 refills | Status: DC
  Filled 2024-04-03 (×2): qty 5, 25d supply, fill #0

## 2024-04-24 ENCOUNTER — Ambulatory Visit: Admitting: Internal Medicine

## 2024-04-24 ENCOUNTER — Encounter (INDEPENDENT_AMBULATORY_CARE_PROVIDER_SITE_OTHER): Admitting: Ophthalmology

## 2024-04-24 ENCOUNTER — Encounter: Payer: Self-pay | Admitting: Internal Medicine

## 2024-04-24 VITALS — BP 124/68 | Ht 69.0 in | Wt 203.6 lb

## 2024-04-24 DIAGNOSIS — I10 Essential (primary) hypertension: Secondary | ICD-10-CM | POA: Diagnosis not present

## 2024-04-24 DIAGNOSIS — E7800 Pure hypercholesterolemia, unspecified: Secondary | ICD-10-CM | POA: Diagnosis not present

## 2024-04-24 DIAGNOSIS — H33301 Unspecified retinal break, right eye: Secondary | ICD-10-CM

## 2024-04-24 DIAGNOSIS — E6609 Other obesity due to excess calories: Secondary | ICD-10-CM

## 2024-04-24 DIAGNOSIS — H43813 Vitreous degeneration, bilateral: Secondary | ICD-10-CM

## 2024-04-24 DIAGNOSIS — R739 Hyperglycemia, unspecified: Secondary | ICD-10-CM | POA: Diagnosis not present

## 2024-04-24 DIAGNOSIS — Z6830 Body mass index (BMI) 30.0-30.9, adult: Secondary | ICD-10-CM

## 2024-04-24 DIAGNOSIS — H2513 Age-related nuclear cataract, bilateral: Secondary | ICD-10-CM

## 2024-04-24 DIAGNOSIS — Z0000 Encounter for general adult medical examination without abnormal findings: Secondary | ICD-10-CM

## 2024-04-24 DIAGNOSIS — H35033 Hypertensive retinopathy, bilateral: Secondary | ICD-10-CM

## 2024-04-24 DIAGNOSIS — E66811 Obesity, class 1: Secondary | ICD-10-CM

## 2024-04-24 LAB — CBC
HCT: 44.6 % (ref 35.0–45.0)
Hemoglobin: 14.4 g/dL (ref 11.7–15.5)
MCH: 29.6 pg (ref 27.0–33.0)
MCHC: 32.3 g/dL (ref 32.0–36.0)
MCV: 91.8 fL (ref 80.0–100.0)
MPV: 9.6 fL (ref 7.5–12.5)
Platelets: 209 Thousand/uL (ref 140–400)
RBC: 4.86 Million/uL (ref 3.80–5.10)
RDW: 12.5 % (ref 11.0–15.0)
WBC: 8.6 Thousand/uL (ref 3.8–10.8)

## 2024-04-24 LAB — COMPREHENSIVE METABOLIC PANEL WITH GFR
AG Ratio: 1.7 (calc) (ref 1.0–2.5)
ALT: 16 U/L (ref 6–29)
AST: 18 U/L (ref 10–35)
Albumin: 4.3 g/dL (ref 3.6–5.1)
Alkaline phosphatase (APISO): 72 U/L (ref 37–153)
BUN: 14 mg/dL (ref 7–25)
CO2: 31 mmol/L (ref 20–32)
Calcium: 9.9 mg/dL (ref 8.6–10.4)
Chloride: 98 mmol/L (ref 98–110)
Creat: 0.53 mg/dL (ref 0.50–1.05)
Globulin: 2.6 g/dL (ref 1.9–3.7)
Glucose, Bld: 90 mg/dL (ref 65–99)
Potassium: 3.3 mmol/L — ABNORMAL LOW (ref 3.5–5.3)
Sodium: 138 mmol/L (ref 135–146)
Total Bilirubin: 0.4 mg/dL (ref 0.2–1.2)
Total Protein: 6.9 g/dL (ref 6.1–8.1)
eGFR: 103 mL/min/1.73m2 (ref >=60)

## 2024-04-24 LAB — LIPID PANEL
Cholesterol: 172 mg/dL (ref <200)
HDL: 62 mg/dL (ref >=50)
LDL Cholesterol (Calc): 91 mg/dL
Non-HDL Cholesterol (Calc): 110 mg/dL (ref <130)
Total CHOL/HDL Ratio: 2.8 (calc) (ref <5.0)
Triglycerides: 92 mg/dL (ref <150)

## 2024-04-24 LAB — HEMOGLOBIN A1C
Hgb A1c MFr Bld: 5.3 % (ref <5.7)
Mean Plasma Glucose: 105 mg/dL
eAG (mmol/L): 5.8 mmol/L

## 2024-04-24 MED ORDER — ASPIRIN 81 MG PO TBEC: 81.0000 mg | DELAYED_RELEASE_TABLET | Freq: Every day | ORAL | Status: AC

## 2024-04-24 NOTE — Patient Instructions (Signed)
 Health Maintenance for Postmenopausal Women Menopause is a normal process in which your ability to get pregnant comes to an end. This process happens slowly over many months or years, usually between the ages of 76 and 38. Menopause is complete when you have missed your menstrual period for 12 months. It is important to talk with your health care provider about some of the most common conditions that affect women after menopause (postmenopausal women). These include heart disease, cancer, and bone loss (osteoporosis). Adopting a healthy lifestyle and getting preventive care can help to promote your health and wellness. The actions you take can also lower your chances of developing some of these common conditions. What are the signs and symptoms of menopause? During menopause, you may have the following symptoms: Hot flashes. These can be moderate or severe. Night sweats. Decrease in sex drive. Mood swings. Headaches. Tiredness (fatigue). Irritability. Memory problems. Problems falling asleep or staying asleep. Talk with your health care provider about treatment options for your symptoms. Do I need hormone replacement therapy? Hormone replacement therapy is effective in treating symptoms that are caused by menopause, such as hot flashes and night sweats. Hormone replacement carries certain risks, especially as you become older. If you are thinking about using estrogen or estrogen with progestin, discuss the benefits and risks with your health care provider. How can I reduce my risk for heart disease and stroke? The risk of heart disease, heart attack, and stroke increases as you age. One of the causes may be a change in the body's hormones during menopause. This can affect how your body uses dietary fats, triglycerides, and cholesterol. Heart attack and stroke are medical emergencies. There are many things that you can do to help prevent heart disease and stroke. Watch your blood pressure High  blood pressure causes heart disease and increases the risk of stroke. This is more likely to develop in people who have high blood pressure readings or are overweight. Have your blood pressure checked: Every 3-5 years if you are 32-23 years of age. Every year if you are 31 years old or older. Eat a healthy diet  Eat a diet that includes plenty of vegetables, fruits, low-fat dairy products, and lean protein. Do not eat a lot of foods that are high in solid fats, added sugars, or sodium. Get regular exercise Get regular exercise. This is one of the most important things you can do for your health. Most adults should: Try to exercise for at least 150 minutes each week. The exercise should increase your heart rate and make you sweat (moderate-intensity exercise). Try to do strengthening exercises at least twice each week. Do these in addition to the moderate-intensity exercise. Spend less time sitting. Even light physical activity can be beneficial. Other tips Work with your health care provider to achieve or maintain a healthy weight. Do not use any products that contain nicotine or tobacco. These products include cigarettes, chewing tobacco, and vaping devices, such as e-cigarettes. If you need help quitting, ask your health care provider. Know your numbers. Ask your health care provider to check your cholesterol and your blood sugar (glucose). Continue to have your blood tested as directed by your health care provider. Do I need screening for cancer? Depending on your health history and family history, you may need to have cancer screenings at different stages of your life. This may include screening for: Breast cancer. Cervical cancer. Lung cancer. Colorectal cancer. What is my risk for osteoporosis? After menopause, you may be  at increased risk for osteoporosis. Osteoporosis is a condition in which bone destruction happens more quickly than new bone creation. To help prevent osteoporosis or  the bone fractures that can happen because of osteoporosis, you may take the following actions: If you are 24-54 years old, get at least 1,000 mg of calcium and at least 600 international units (IU) of vitamin D  per day. If you are older than age 75 but younger than age 30, get at least 1,200 mg of calcium and at least 600 international units (IU) of vitamin D  per day. If you are older than age 8, get at least 1,200 mg of calcium and at least 800 international units (IU) of vitamin D  per day. Smoking and drinking excessive alcohol increase the risk of osteoporosis. Eat foods that are rich in calcium and vitamin D , and do weight-bearing exercises several times each week as directed by your health care provider. How does menopause affect my mental health? Depression may occur at any age, but it is more common as you become older. Common symptoms of depression include: Feeling depressed. Changes in sleep patterns. Changes in appetite or eating patterns. Feeling an overall lack of motivation or enjoyment of activities that you previously enjoyed. Frequent crying spells. Talk with your health care provider if you think that you are experiencing any of these symptoms. General instructions See your health care provider for regular wellness exams and vaccines. This may include: Scheduling regular health, dental, and eye exams. Getting and maintaining your vaccines. These include: Influenza vaccine. Get this vaccine each year before the flu season begins. Pneumonia vaccine. Shingles vaccine. Tetanus, diphtheria, and pertussis (Tdap) booster vaccine. Your health care provider may also recommend other immunizations. Tell your health care provider if you have ever been abused or do not feel safe at home. Summary Menopause is a normal process in which your ability to get pregnant comes to an end. This condition causes hot flashes, night sweats, decreased interest in sex, mood swings, headaches, or lack  of sleep. Treatment for this condition may include hormone replacement therapy. Take actions to keep yourself healthy, including exercising regularly, eating a healthy diet, watching your weight, and checking your blood pressure and blood sugar levels. Get screened for cancer and depression. Make sure that you are up to date with all your vaccines. This information is not intended to replace advice given to you by your health care provider. Make sure you discuss any questions you have with your health care provider. Document Revised: 10/10/2020 Document Reviewed: 10/10/2020 Elsevier Patient Education  2024 [employer].

## 2024-04-24 NOTE — Progress Notes (Signed)
 Subjective:    Patient ID: Hannah Giles, female    DOB: 05-06-1961, 63 y.o.   MRN: [id number]  HPI  Patient presents to clinic today for her welcome to Medicare exam  Past Medical History:  Diagnosis Date   Allergy    Niacin   Anxiety    Occasionally   Diabetes mellitus without complication (HCC)    Diet controlled   Hyperlipidemia    Hypertension    Urinary incontinence     Current Outpatient Medications  Medication Sig Dispense Refill   aspirin  EC 81 MG tablet Take 1 tablet (81 mg total) by mouth daily. Swallow whole.     atenolol -chlorthalidone  (TENORETIC ) 50-25 MG tablet Take 1 tablet by mouth daily. 90 tablet 0   Azelaic Acid  15 % gel Apply topically to face daily. 50 g 5   Biotin 10000 MCG TABS      calcipotriene  (DOVONOX) 0.005 % cream Apply twice daily to affected areas. 60 g 0   Calcium  Carb-Cholecalciferol 500-400 MG-UNIT TABS      Cholecalciferol (VITAMIN D ) 50 MCG (2000 UT) CAPS      clobetasol  ointment (TEMOVATE ) 0.05 % Apply 1 Application topically 2 (two) times daily. 60 g 1   hydrochlorothiazide  (HYDRODIURIL ) 12.5 MG tablet TAKE 1 TABLET BY MOUTH EVERY DAY AS NEEDED (NOT COVERED AT [facility]) (Patient taking differently: as needed.) 30 tablet 0   lisinopril  (ZESTRIL ) 5 MG tablet Take 1 tablet (5 mg total) by mouth daily. 90 tablet 0   metroNIDAZOLE  (METROGEL ) 1 % gel Apply to affected areas around mouth and nose nightly until rash is clear (Patient taking differently: Apply topically as needed.) 60 g 2   mirabegron  ER (MYRBETRIQ ) 50 MG TB24 tablet Take 1 tablet (50 mg total) by mouth daily. 90 tablet 1   phentermine  30 MG capsule Take 1 capsule (30 mg total) by mouth every morning. 30 capsule 0   polyethylene glycol (MIRALAX  / GLYCOLAX ) packet Take 17 g by mouth daily.     rosuvastatin  (CRESTOR ) 5 MG tablet Take 1 tablet (5 mg total) by mouth daily. 90 tablet 0   Azelaic Acid  (FINACEA ) 15 % gel Apply topically daily. 50 g 0   No current facility-administered  medications for this visit.    Allergies  Allergen Reactions   Niacin     REACTION: flushing, SOB face numb    Family History  Problem Relation Age of Onset   Colon cancer Mother    Cancer Mother    Heart disease Father        s/p stents in the 57's   Dementia Father    Hypertension Father    Hypertension Sister    Hypertension Sister    Diabetes Paternal Uncle    Pancreatic cancer Paternal Grandfather    Stroke Maternal Grandmother    Rectal cancer Neg Hx    Stomach cancer Neg Hx    Breast cancer Neg Hx     Social History   Socioeconomic History   Marital status: Married    Spouse name: Not on file   Number of children: Not on file   Years of education: Not on file   Highest education level: Bachelor's degree (e.g., BA, AB, BS)  Occupational History   Occupation: [employer], Advertising Copywriter  Tobacco Use   Smoking status: Former    Current packs/day: 0.00    Average packs/day: 0.5 packs/day for 13.0 years (6.5 ttl pk-yrs)    Types: Cigarettes    Start date: 06/04/1976  Quit date: 06/04/1989    Years since quitting: 34.9   Smokeless tobacco: Never  Vaping Use   Vaping status: Never Used  Substance and Sexual Activity   Alcohol use: Yes    Alcohol/week: 4.0 standard drinks of alcohol    Types: 4 Glasses of wine per week    Comment: socially   Drug use: Never   Sexual activity: Not Currently    Birth control/protection: Post-menopausal  Other Topics Concern   Not on file  Social History Narrative   Not on file   Social Drivers of Health   Financial Resource Strain: Low Risk  (04/20/2024)   Overall Financial Resource Strain (CARDIA)    Difficulty of Paying Living Expenses: Not hard at all  Food Insecurity: No Food Insecurity (04/20/2024)   Hunger Vital Sign    Worried About Running Out of Food in the Last Year: Never true    Ran Out of Food in the Last Year: Never true  Transportation Needs: No Transportation Needs (04/20/2024)   PRAPARE - Therapist, Art (Medical): No    Lack of Transportation (Non-Medical): No  Physical Activity: Inactive (04/20/2024)   Exercise Vital Sign    Days of Exercise per Week: 0 days    Minutes of Exercise per Session: Not on file  Stress: No Stress Concern Present (04/20/2024)   [employer] of Occupational Health - Occupational Stress Questionnaire    Feeling of Stress: Not at all  Social Connections: Moderately Isolated (04/20/2024)   Social Connection and Isolation Panel    Frequency of Communication with Friends and Family: Three times a week    Frequency of Social Gatherings with Friends and Family: Once a week    Attends Religious Services: Never    Database Administrator or Organizations: No    Attends Engineer, Structural: Not on file    Marital Status: Married  Intimate Partner Violence: Not on file    Hospitalizations: None  Health Maintenance:    Flu: 02/2024 Tetanus: 03/2019 COVID:  x5 Pneumovax: 02/2020 Prevnar: Never Shingrix: 08/2020, 11/2020 Pap smear: 2023, [facility] Mammogram: 03/2024 Bone density: 11/2022 Colon screening: 08/2021 Vision screening: annually Dentist: biannually   Providers:   PCP: Angeline Laura, NP     I have personally reviewed and have noted:  1. The patient's medical and social history 2. Their use of alcohol, tobacco or illicit drugs 3. Their current medications and supplements 4. The patient's functional ability including ADL's, fall risks, home safety risks and hearing or visual impairment. 5. Diet and physical activities 6. Evidence for depression or mood disorder     Review of Systems   Past Medical History:  Diagnosis Date   Hyperlipidemia    Hypertension    Urinary incontinence     Current Outpatient Medications  Medication Sig Dispense Refill   atenolol -chlorthalidone  (TENORETIC ) 50-25 MG tablet Take 1 tablet by mouth daily. 90 tablet 0   Azelaic Acid  (FINACEA ) 15 % gel Apply topically  daily. 50 g 0   Azelaic Acid  15 % gel Apply topically to face daily. 50 g 5   Biotin 10000 MCG TABS      calcipotriene  (DOVONOX) 0.005 % cream Apply twice daily to affected areas. 60 g 0   Calcium  Carb-Cholecalciferol 500-400 MG-UNIT TABS      Cholecalciferol (VITAMIN D ) 50 MCG (2000 UT) CAPS      clobetasol  cream (TEMOVATE ) 0.05 % Apply twice daily to affected areas on arms  and groin until resolved, then discontinue 60 g 1   clobetasol  ointment (TEMOVATE ) 0.05 % Apply 1 Application topically 2 (two) times daily. 60 g 1   clobetasol  ointment (TEMOVATE ) 0.05 % Apply 1 application twice daily to affected areas while flared, then once weekly as maintenance 60 g 2   hydrochlorothiazide  (HYDRODIURIL ) 12.5 MG tablet TAKE 1 TABLET BY MOUTH EVERY DAY AS NEEDED (NOT COVERED AT [facility]) 30 tablet 0   ketorolac  (ACULAR ) 0.5 % ophthalmic solution Place 1 drop into the right eye 4 (four) times daily for 1 week 10 mL 1   lisinopril  (ZESTRIL ) 5 MG tablet Take 1 tablet (5 mg total) by mouth daily. 90 tablet 0   metroNIDAZOLE  (METROGEL ) 1 % gel Apply to affected areas around mouth and nose nightly until rash is clear 60 g 2   mirabegron  ER (MYRBETRIQ ) 50 MG TB24 tablet Take 1 tablet (50 mg total) by mouth daily. 90 tablet 1   phentermine  30 MG capsule Take 1 capsule (30 mg total) by mouth every morning. 30 capsule 0   polyethylene glycol (MIRALAX  / GLYCOLAX ) packet Take 17 g by mouth daily.     rosuvastatin  (CRESTOR ) 5 MG tablet Take 1 tablet (5 mg total) by mouth daily. 90 tablet 0   No current facility-administered medications for this visit.    Allergies  Allergen Reactions   Niacin     REACTION: flushing, SOB face numb    Family History  Problem Relation Age of Onset   Colon cancer Mother    Heart disease Father        s/p stents in the 60's   Dementia Father    Hypertension Sister    Hypertension Sister    Diabetes Paternal Uncle    Pancreatic cancer Paternal Grandfather    Rectal cancer Neg Hx     Stomach cancer Neg Hx    Breast cancer Neg Hx     Social History   Socioeconomic History   Marital status: Married    Spouse name: Not on file   Number of children: Not on file   Years of education: Not on file   Highest education level: Bachelor's degree (e.g., BA, AB, BS)  Occupational History   Occupation: [employer], Advertising Copywriter  Tobacco Use   Smoking status: Former    Current packs/day: 0.00    Average packs/day: 0.5 packs/day for 13.0 years (6.5 ttl pk-yrs)    Types: Cigarettes    Start date: 06/04/1976    Quit date: 06/04/1989    Years since quitting: 34.9   Smokeless tobacco: Never  Vaping Use   Vaping status: Never Used  Substance and Sexual Activity   Alcohol use: Yes    Alcohol/week: 3.0 - 4.0 standard drinks of alcohol    Types: 3 - 4 Glasses of wine per week    Comment: socially   Drug use: No   Sexual activity: Not on file  Other Topics Concern   Not on file  Social History Narrative   Not on file   Social Drivers of Health   Financial Resource Strain: Low Risk  (04/20/2024)   Overall Financial Resource Strain (CARDIA)    Difficulty of Paying Living Expenses: Not hard at all  Food Insecurity: No Food Insecurity (04/20/2024)   Hunger Vital Sign    Worried About Running Out of Food in the Last Year: Never true    Ran Out of Food in the Last Year: Never true  Transportation Needs: No Transportation Needs (  04/20/2024)   PRAPARE - Administrator, Civil Service (Medical): No    Lack of Transportation (Non-Medical): No  Physical Activity: Inactive (04/20/2024)   Exercise Vital Sign    Days of Exercise per Week: 0 days    Minutes of Exercise per Session: Not on file  Stress: No Stress Concern Present (04/20/2024)   [employer] of Occupational Health - Occupational Stress Questionnaire    Feeling of Stress: Not at all  Social Connections: Moderately Isolated (04/20/2024)   Social Connection and Isolation Panel    Frequency of Communication  with Friends and Family: Three times a week    Frequency of Social Gatherings with Friends and Family: Once a week    Attends Religious Services: Never    Database Administrator or Organizations: No    Attends Engineer, Structural: Not on file    Marital Status: Married  Catering Manager Violence: Not on file     Constitutional: Denies fever, malaise, fatigue, headache or abrupt weight changes.  HEENT: Denies eye pain, eye redness, ear pain, ringing in the ears, wax buildup, runny nose, nasal congestion, bloody nose, or sore throat. Respiratory: Denies difficulty breathing, shortness of breath, cough or sputum production.   Cardiovascular: Denies chest pain, chest tightness, palpitations or swelling in the hands or feet.  Gastrointestinal: Denies abdominal pain, bloating, constipation, diarrhea or blood in the stool.  GU: Pt reports stress incontinence. Denies urgency, frequency, pain with urination, burning sensation, blood in urine, odor or discharge. Musculoskeletal: Denies decrease in range of motion, difficulty with gait, muscle pain or joint pain and swelling.  Skin: Pt reports rash of groin and thighs, skin lesion of left upper thigh. Denies redness, rashes, or ulcercations.  Neurological: Denies dizziness, difficulty with memory, difficulty with speech or problems with balance and coordination.  Psych: Pt has a history of anxiety and depression. Denies SI/HI.  No other specific complaints in a complete review of systems (except as listed in HPI above).   Objective:   Physical Exam BP 124/68 (BP Location: Right Arm, Patient Position: Sitting, Cuff Size: Normal)   Ht 5' 9 (1.753 m)   Wt 203 lb 9.6 oz (92.4 kg)   BMI 30.07 kg/m     Wt Readings from Last 3 Encounters:  10/24/23 201 lb (91.2 kg)  07/22/23 209 lb (94.8 kg)  04/25/23 202 lb (91.6 kg)    General: Appears her stated age, obese, in NAD. Skin: Warm, dry and intact. No ulcerations noted. HEENT: Head:  normal shape and size; Eyes: sclera white, no icterus, conjunctiva pink, PERRLA and EOMs intact;  Neck:  Neck supple, trachea midline. No masses, lumps or thyromegaly present.  Cardiovascular: Normal rate and rhythm. S1,S2 noted.  No murmur, rubs or gallops noted. No JVD or BLE edema. No carotid bruits noted. Pulmonary/Chest: Normal effort and positive vesicular breath sounds. No respiratory distress. No wheezes, rales or ronchi noted.  Abdomen: Normal bowel sounds.  Swelling noted to the left flank from prior hematoma. Musculoskeletal: Strength 5/5 BUE/BLE. No difficulty with gait.  Neurological: Alert and oriented. Cranial nerves II-XII grossly intact. Coordination normal.  Psychiatric: Mood and affect normal. Behavior is normal. Judgment and thought content normal.    BMET    Component Value Date/Time   NA 139 10/24/2023 0959   NA 138 09/01/2013 0017   K 3.4 (L) 10/24/2023 0959   K 3.0 (L) 09/01/2013 0017   CL 98 10/24/2023 0959   CL 100 09/01/2013 0017  CO2 33 (H) 10/24/2023 0959   CO2 33 (H) 09/01/2013 0017   GLUCOSE 101 (H) 10/24/2023 0959   GLUCOSE 208 (H) 09/01/2013 0017   BUN 20 10/24/2023 0959   BUN 18 09/01/2013 0017   CREATININE 0.52 10/24/2023 0959   CALCIUM  9.9 10/24/2023 0959   CALCIUM  9.3 09/01/2013 0017   GFRNONAA >60 09/01/2013 0017   GFRAA >60 09/01/2013 0017    Lipid Panel     Component Value Date/Time   CHOL 169 10/24/2023 0959   TRIG 116 10/24/2023 0959   HDL 60 10/24/2023 0959   CHOLHDL 2.8 10/24/2023 0959   VLDL 21.6 08/25/2020 0928   LDLCALC 88 10/24/2023 0959    CBC    Component Value Date/Time   WBC 9.1 10/24/2023 0959   RBC 4.53 10/24/2023 0959   HGB 13.8 10/24/2023 0959   HGB 13.8 09/01/2013 0017   HCT 41.4 10/24/2023 0959   HCT 40.9 09/01/2013 0017   PLT 187 10/24/2023 0959   PLT 216 09/01/2013 0017   MCV 91.4 10/24/2023 0959   MCV 85 09/01/2013 0017   MCH 30.5 10/24/2023 0959   MCHC 33.3 10/24/2023 0959   RDW 12.8 10/24/2023  0959   RDW 13.9 09/01/2013 0017   LYMPHSABS 2.3 04/01/2015 0738   MONOABS 0.3 04/01/2015 0738   EOSABS 0.1 04/01/2015 0738   BASOSABS 0.0 04/01/2015 0738    Hgb A1C Lab Results  Component Value Date   HGBA1C 5.4 10/24/2023           Assessment & Plan:   Preventative Health Maintenance:  Medicare Annual Wellness Visit:  Diet: She does eat meat. She consumes fruits and veggies. She rarely eats fried foods. She drinks mostly water. Exercise: None Depression/mood screen: Negative Hearing: Intact to whispered voice Visual acuity: Grossly normal, performs annual eye exam  ADLs: Capable Fall risk: None Home safety: Good Cognitive evaluation: Intact to orientation, naming, recall and repetition EOL planning: Adv directives, full code/ I agree   Flu shot UTD Tetanus UTD Encouraged her to get her COVID booster Pneumovax UTD Prevnar 20 declined today Shingrix UTD Pap smear UTD, will request copy Mammogram UTD Bone density UTD Colon screening UTD Encouraged her to consume a balanced diet and exercise regimen Advised her to see an eye doctor and dentist annually Will check CBC, c-Met, lipid, A1c today   RTC in 6 months for follow-up chronic conditions Angeline Laura, NP

## 2024-04-24 NOTE — Assessment & Plan Note (Signed)
 Encouraged diet and exercise for weight loss ?

## 2024-04-25 ENCOUNTER — Encounter: Payer: Self-pay | Admitting: Internal Medicine

## 2024-04-25 DIAGNOSIS — E876 Hypokalemia: Secondary | ICD-10-CM

## 2024-04-26 ENCOUNTER — Other Ambulatory Visit: Payer: Self-pay | Admitting: Internal Medicine

## 2024-04-27 ENCOUNTER — Telehealth (HOSPITAL_COMMUNITY): Payer: Self-pay

## 2024-04-27 ENCOUNTER — Ambulatory Visit: Payer: Self-pay | Admitting: Internal Medicine

## 2024-04-27 ENCOUNTER — Other Ambulatory Visit (HOSPITAL_COMMUNITY): Payer: Self-pay

## 2024-04-27 MED ORDER — MIRABEGRON ER 50 MG PO TB24
50.0000 mg | ORAL_TABLET | Freq: Every day | ORAL | 3 refills | Status: DC
  Filled 2024-04-27: qty 90, 90d supply, fill #0

## 2024-04-27 NOTE — Telephone Encounter (Signed)
 Requested Prescriptions  Pending Prescriptions Disp Refills   mirabegron  ER (MYRBETRIQ ) 50 MG TB24 tablet 90 tablet 3    Sig: Take 1 tablet (50 mg total) by mouth daily.     Urology: Bladder Agents - mirabegron  Passed - 04/27/2024  5:56 PM      Passed - Cr in normal range and within 360 days    Creat  Date Value Ref Range Status  04/24/2024 0.53 0.50 - 1.05 mg/dL Final   Creatinine, Urine  Date Value Ref Range Status  10/24/2023 39 20 - 275 mg/dL Final         Passed - ALT in normal range and within 360 days    ALT  Date Value Ref Range Status  04/24/2024 16 6 - 29 U/L Final   SGPT (ALT)  Date Value Ref Range Status  09/01/2013 34 12 - 78 U/L Final         Passed - AST in normal range and within 360 days    AST  Date Value Ref Range Status  04/24/2024 18 10 - 35 U/L Final   SGOT(AST)  Date Value Ref Range Status  09/01/2013 20 15 - 37 Unit/L Final         Passed - eGFR is 15 or above and within 360 days    EGFR (African American)  Date Value Ref Range Status  09/01/2013 >60  Final   EGFR (Non-African Amer.)  Date Value Ref Range Status  09/01/2013 >60  Final    Comment:    eGFR values <12mL/min/1.73 m2 may be an indication of chronic kidney disease (CKD). Calculated eGFR is useful in patients with stable renal function. The eGFR calculation will not be reliable in acutely ill patients when serum creatinine is changing rapidly. It is not useful in  patients on dialysis. The eGFR calculation may not be applicable to patients at the low and high extremes of body sizes, pregnant women, and vegetarians.    GFR  Date Value Ref Range Status  08/25/2020 98.04 >60.00 mL/min Final    Comment:    Calculated using the CKD-EPI Creatinine Equation (2021)   eGFR  Date Value Ref Range Status  04/24/2024 103 > OR = 60 mL/min/1.54m2 Final         Passed - Last BP in normal range    BP Readings from Last 1 Encounters:  04/24/24 124/68         Passed - Valid  encounter within last 12 months    Recent Outpatient Visits           3 days ago Welcome to [employer] preventive visit   [place] [facility] [place], [place] W, NP   6 months ago Type 2 diabetes mellitus without complication, without long-term current use of insulin [facility])   [place] [facility] [place], Angeline ORN, NP   7 months ago Urge incontinence   [place] [facility] [place], [place] W, NP   9 months ago Overweight with body mass index (BMI) of 29 to 29.9 in adult   [facility] [place], Angeline ORN, NP

## 2024-04-28 ENCOUNTER — Telehealth (HOSPITAL_COMMUNITY): Payer: Self-pay | Admitting: Pharmacy Technician

## 2024-04-28 ENCOUNTER — Other Ambulatory Visit (HOSPITAL_COMMUNITY): Payer: Self-pay

## 2024-04-28 MED ORDER — PHENTERMINE HCL 30 MG PO CAPS: 30.0000 mg | ORAL_CAPSULE | ORAL | 0 refills | Status: AC

## 2024-04-28 NOTE — Telephone Encounter (Signed)
 Pharmacy Patient Advocate Encounter   Received notification from Patient Pharmacy/Patient Calls Messages that prior authorization for Myrbetriq  50mg  is required/requested.   Insurance verification completed.   The patient is insured through [employer].   Per test claim:   tolterodine extended-release (Detrol LA), oxybutynin  IR, oxybutynin  ER, trospium (Sanctura), solifenacin (Vesicare), darifenacin (Enablex) or fesoterodine (Toviaz) is preferred by the insurance.  If suggested medication is appropriate, Please send in a new RX and discontinue this one. If not, please advise as to why it's not appropriate so that we may request a Prior Authorization. Please note, some preferred medications may still require a PA.  If the suggested medications have not been trialed and there are no contraindications to their use, the PA will not be submitted, as it will not be approved. This is the question the PA form asks:  Has the patient had a 12-week trial of ONE of the following medications AND experienced therapeutic failure: tolterodine extended-release (Detrol LA), oxybutynin  IR, oxybutynin  ER, trospium (Sanctura), solifenacin (Vesicare), darifenacin (Enablex) or fesoterodine (Toviaz)?   I test claimed a few. Trospium 20mg  $5.59 for 30, Tolterodine 4mg  $4.30 for 30, Oxybutynin  10mg  $2.32 for 30.

## 2024-04-28 NOTE — Addendum Note (Signed)
 Addended by: ANTONETTE ANGELINE ORN on: 04/28/2024 10:00 AM   Modules accepted: Orders

## 2024-04-28 NOTE — Telephone Encounter (Signed)
 PA request has been Received. New Encounter has been or will be created for follow up. For additional info see Pharmacy Prior Auth telephone encounter from 04/28/24.

## 2024-04-29 ENCOUNTER — Other Ambulatory Visit: Payer: Self-pay

## 2024-04-29 ENCOUNTER — Other Ambulatory Visit (HOSPITAL_COMMUNITY): Payer: Self-pay

## 2024-04-29 MED ORDER — OXYBUTYNIN CHLORIDE ER 10 MG PO TB24
10.0000 mg | ORAL_TABLET | Freq: Every day | ORAL | 1 refills | Status: AC
  Filled 2024-04-29: qty 90, 90d supply, fill #0

## 2024-06-03 ENCOUNTER — Encounter: Payer: Self-pay | Admitting: Internal Medicine

## 2024-06-03 DIAGNOSIS — E875 Hyperkalemia: Secondary | ICD-10-CM

## 2024-06-12 ENCOUNTER — Ambulatory Visit: Payer: Self-pay | Admitting: Internal Medicine

## 2024-06-12 LAB — BASIC METABOLIC PANEL WITH GFR
BUN/Creatinine Ratio: 26 (ref 12–28)
BUN: 15 mg/dL (ref 8–27)
CO2: 30 mmol/L — ABNORMAL HIGH (ref 20–29)
Calcium: 10.1 mg/dL (ref 8.7–10.3)
Chloride: 98 mmol/L (ref 96–106)
Creatinine, Ser: 0.58 mg/dL (ref 0.57–1.00)
Glucose: 92 mg/dL (ref 70–99)
Potassium: 4.3 mmol/L (ref 3.5–5.2)
Sodium: 141 mmol/L (ref 134–144)
eGFR: 100 mL/min/1.73

## 2024-06-22 ENCOUNTER — Encounter: Payer: Self-pay | Admitting: Internal Medicine

## 2024-06-22 ENCOUNTER — Other Ambulatory Visit: Payer: Self-pay

## 2024-06-22 ENCOUNTER — Other Ambulatory Visit (HOSPITAL_COMMUNITY): Payer: Self-pay

## 2024-06-22 DIAGNOSIS — L900 Lichen sclerosus et atrophicus: Secondary | ICD-10-CM

## 2024-06-22 MED ORDER — CLOBETASOL PROPIONATE 0.05 % EX OINT
1.0000 | TOPICAL_OINTMENT | Freq: Two times a day (BID) | CUTANEOUS | 1 refills | Status: AC
  Filled 2024-06-22: qty 15, 30d supply, fill #0

## 2024-06-22 MED ORDER — ROSUVASTATIN CALCIUM 5 MG PO TABS
5.0000 mg | ORAL_TABLET | Freq: Every day | ORAL | 0 refills | Status: AC
  Filled 2024-06-22: qty 90, 90d supply, fill #0

## 2024-06-22 MED ORDER — ATENOLOL-CHLORTHALIDONE 50-25 MG PO TABS
1.0000 | ORAL_TABLET | Freq: Every day | ORAL | 0 refills | Status: AC
  Filled 2024-06-22: qty 90, 90d supply, fill #0

## 2024-06-22 MED ORDER — LISINOPRIL 5 MG PO TABS
5.0000 mg | ORAL_TABLET | Freq: Every day | ORAL | 0 refills | Status: AC
  Filled 2024-06-22: qty 90, 90d supply, fill #0

## 2024-07-07 ENCOUNTER — Encounter: Payer: Self-pay | Admitting: Internal Medicine

## 2024-07-14 ENCOUNTER — Ambulatory Visit: Admitting: Internal Medicine

## 2024-07-21 ENCOUNTER — Ambulatory Visit: Admitting: Internal Medicine

## 2024-08-13 ENCOUNTER — Encounter (INDEPENDENT_AMBULATORY_CARE_PROVIDER_SITE_OTHER): Admitting: Ophthalmology

## 2024-10-23 ENCOUNTER — Ambulatory Visit: Admitting: Internal Medicine
